# Patient Record
Sex: Male | Born: 1965 | Race: White | Hispanic: No | Marital: Married | Smoking: Never smoker
Health system: Southern US, Community
[De-identification: ages and names within clinical notes are randomized; demographics above are authoritative.]

## PROBLEM LIST (undated history)

## (undated) DIAGNOSIS — F1910 Other psychoactive substance abuse, uncomplicated: Secondary | ICD-10-CM

## (undated) DIAGNOSIS — I10 Essential (primary) hypertension: Secondary | ICD-10-CM

## (undated) DIAGNOSIS — K219 Gastro-esophageal reflux disease without esophagitis: Secondary | ICD-10-CM

## (undated) DIAGNOSIS — T7840XA Allergy, unspecified, initial encounter: Secondary | ICD-10-CM

## (undated) DIAGNOSIS — F419 Anxiety disorder, unspecified: Secondary | ICD-10-CM

## (undated) DIAGNOSIS — E1165 Type 2 diabetes mellitus with hyperglycemia: Secondary | ICD-10-CM

## (undated) DIAGNOSIS — F329 Major depressive disorder, single episode, unspecified: Secondary | ICD-10-CM

## (undated) DIAGNOSIS — E1159 Type 2 diabetes mellitus with other circulatory complications: Secondary | ICD-10-CM

## (undated) DIAGNOSIS — E119 Type 2 diabetes mellitus without complications: Secondary | ICD-10-CM

## (undated) DIAGNOSIS — E559 Vitamin D deficiency, unspecified: Secondary | ICD-10-CM

## (undated) DIAGNOSIS — E785 Hyperlipidemia, unspecified: Secondary | ICD-10-CM

## (undated) DIAGNOSIS — Z8719 Personal history of other diseases of the digestive system: Secondary | ICD-10-CM

## (undated) HISTORY — PX: ESOPHAGOGASTRODUODENOSCOPY (EGD) WITH ESOPHAGEAL DILATION: SHX5812

## (undated) HISTORY — DX: Essential (primary) hypertension: I10

## (undated) HISTORY — DX: Allergy, unspecified, initial encounter: T78.40XA

## (undated) HISTORY — DX: Vitamin D deficiency, unspecified: E55.9

## (undated) HISTORY — DX: Personal history of other diseases of the digestive system: Z87.19

## (undated) HISTORY — PX: TONSILLECTOMY: SUR1361

## (undated) HISTORY — DX: Hyperlipidemia, unspecified: E78.5

## (undated) HISTORY — DX: Type 2 diabetes mellitus with other circulatory complications: E11.59

## (undated) HISTORY — DX: Major depressive disorder, single episode, unspecified: F32.9

## (undated) HISTORY — DX: Other psychoactive substance abuse, uncomplicated: F19.10

## (undated) HISTORY — PX: ABDOMINAL SURGERY: SHX537

## (undated) HISTORY — PX: VASECTOMY: SHX75

## (undated) HISTORY — DX: Anxiety disorder, unspecified: F41.9

## (undated) HISTORY — PX: NASAL SEPTUM SURGERY: SHX37

## (undated) HISTORY — DX: Type 2 diabetes mellitus without complications: E11.9

## (undated) HISTORY — DX: Type 2 diabetes mellitus with hyperglycemia: E11.65

## (undated) HISTORY — DX: Gastro-esophageal reflux disease without esophagitis: K21.9

---

## 1999-09-26 ENCOUNTER — Other Ambulatory Visit: Admission: RE | Admit: 1999-09-26 | Discharge: 1999-09-26 | Payer: Self-pay | Admitting: Otolaryngology

## 2009-06-30 LAB — HM DIABETES EYE EXAM

## 2014-01-18 SURGERY — Surgical Case
Anesthesia: *Unknown

## 2014-08-02 HISTORY — PX: ESOPHAGOGASTRODUODENOSCOPY: SHX1529

## 2014-10-05 LAB — HM COLONOSCOPY

## 2016-08-13 ENCOUNTER — Encounter: Payer: Self-pay | Admitting: Family Medicine

## 2016-08-13 ENCOUNTER — Ambulatory Visit (INDEPENDENT_AMBULATORY_CARE_PROVIDER_SITE_OTHER): Payer: 59 | Admitting: Family Medicine

## 2016-08-13 VITALS — BP 120/50 | HR 72 | Resp 16 | Ht 69.0 in | Wt 183.2 lb

## 2016-08-13 DIAGNOSIS — F32A Depression, unspecified: Secondary | ICD-10-CM

## 2016-08-13 DIAGNOSIS — E119 Type 2 diabetes mellitus without complications: Secondary | ICD-10-CM | POA: Diagnosis not present

## 2016-08-13 DIAGNOSIS — Z7689 Persons encountering health services in other specified circumstances: Secondary | ICD-10-CM | POA: Diagnosis not present

## 2016-08-13 DIAGNOSIS — F329 Major depressive disorder, single episode, unspecified: Secondary | ICD-10-CM

## 2016-08-13 DIAGNOSIS — F419 Anxiety disorder, unspecified: Secondary | ICD-10-CM | POA: Diagnosis not present

## 2016-08-13 LAB — CBC WITH DIFFERENTIAL/PLATELET
BASOS ABS: 0 {cells}/uL (ref 0–200)
BASOS PCT: 0 %
EOS ABS: 178 {cells}/uL (ref 15–500)
Eosinophils Relative: 2 %
HEMATOCRIT: 42.2 % (ref 38.5–50.0)
Hemoglobin: 15.1 g/dL (ref 13.2–17.1)
LYMPHS PCT: 27 %
Lymphs Abs: 2403 cells/uL (ref 850–3900)
MCH: 31.8 pg (ref 27.0–33.0)
MCHC: 35.8 g/dL (ref 32.0–36.0)
MCV: 88.8 fL (ref 80.0–100.0)
MONOS PCT: 9 %
MPV: 8.8 fL (ref 7.5–12.5)
Monocytes Absolute: 801 cells/uL (ref 200–950)
NEUTROS PCT: 62 %
Neutro Abs: 5518 cells/uL (ref 1500–7800)
PLATELETS: 257 10*3/uL (ref 140–400)
RBC: 4.75 MIL/uL (ref 4.20–5.80)
RDW: 13.3 % (ref 11.0–15.0)
WBC: 8.9 10*3/uL (ref 4.0–10.5)

## 2016-08-13 LAB — POCT GLYCOSYLATED HEMOGLOBIN (HGB A1C): Hemoglobin A1C: 6.7

## 2016-08-13 LAB — COMPREHENSIVE METABOLIC PANEL
ALT: 21 U/L (ref 9–46)
AST: 16 U/L (ref 10–40)
Albumin: 4.4 g/dL (ref 3.6–5.1)
Alkaline Phosphatase: 63 U/L (ref 40–115)
BUN: 14 mg/dL (ref 7–25)
CALCIUM: 9.8 mg/dL (ref 8.6–10.3)
CHLORIDE: 105 mmol/L (ref 98–110)
CO2: 23 mmol/L (ref 20–31)
Creat: 0.9 mg/dL (ref 0.60–1.35)
GLUCOSE: 107 mg/dL — AB (ref 65–99)
POTASSIUM: 3.8 mmol/L (ref 3.5–5.3)
Sodium: 140 mmol/L (ref 135–146)
Total Bilirubin: 0.4 mg/dL (ref 0.2–1.2)
Total Protein: 6.5 g/dL (ref 6.1–8.1)

## 2016-08-13 LAB — TSH: TSH: 1.76 mIU/L (ref 0.40–4.50)

## 2016-08-13 MED ORDER — ESCITALOPRAM OXALATE 20 MG PO TABS: 20.0000 mg | ORAL_TABLET | Freq: Every day | ORAL | 2 refills | Status: DC

## 2016-08-13 MED ORDER — METFORMIN HCL 1000 MG PO TABS: 1000.0000 mg | ORAL_TABLET | Freq: Two times a day (BID) | ORAL | 2 refills | Status: DC

## 2016-08-13 NOTE — Patient Instructions (Addendum)
Stop Lantus. Your hemoglobin A1c is 6.7% and within goal range.  Continue taking Metformin twice daily.   Keep watching your sugar and carbohydrates.   Follow up in 4 months for Diabetes. I recommend that you return for an annual exam and fasting labs sometime in the next few weeks.

## 2016-08-13 NOTE — Progress Notes (Signed)
Subjective:    Patient ID: Jacob Hart, male    DOB: [birth year], 51 y.o.   MRN: [id number]  HPI Chief Complaint  Patient presents with  . Establish Care    new patient to establish care, diabetic. Has not had Lantus x 30 days but still taking oral meds. Last A1c about a year ago.    He is new to the practice and here to establish care. States he was diagnosed with type 2 diabetes 10 years ago. States he has been taking Metformin 1,000 mg twice daily. He has been out of Lantus for at least 1 month. Requests refills on medications. Has been on Lantus for the past 5 years.   States he takes Lexapro for anxiety and depression. States he is an alcoholic and stopped drinking alcohol 10 years ago.  Goes to [employer].   States he does not check blood sugars at home.  He stopped drinking soda.  He went to the nutritionist in the past and made healthy changes, cut carbohydrates.  Last eye exam: last year and no retinopathy.   Complains of left neck pain and and mild numbness to his left lateral arm running down to his thumb, 2nd and 3rd digits. This is intermittent for the past 5 months.  Sees a Restaurant manager, fast food. States it is not bothering him much and does not want testing or medication for this. He will let me know if this worsens. Denies weakness.   Denies fever, chills, dizziness, unexplained weight change, chest pain, palpitations, abdominal pain, N/V/D, urinary symptoms.   Colonoscopy 2 years ago.   Previous medical care: Dr. Shearon Stalls at [facility] internal medicine in [place], [place].  Other providers: none   Past medical history: recovering alcoholic and diabetes type 2.   Social history: Lives with divorced, works as a Furniture conservator/restorer.  Denies smoking, drinking alcohol, drug use Diet: he has made healthy diet changes.  Exercise: nothing regular.    Depression screen [facility] 2/9 08/13/2016 08/13/2016  Decreased Interest 0 0  Down, Depressed, Hopeless 0 0  PHQ - 2 Score 0 0     Reviewed  allergies, medications, past medical, surgical, family, and social history.    Review of Systems Pertinent positives and negatives in the history of present illness.     Objective:   Physical Exam BP (!) 120/50 (BP Location: Left Arm, Patient Position: Sitting, Cuff Size: Normal)   Pulse 72   Resp 16   Ht 5\' 9"  (1.753 m)   Wt 183 lb 3.2 oz (83.1 kg)   BMI 27.05 kg/m   Alert and in no distress. Tympanic membranes and canals are normal. Pharyngeal area is normal. Neck is supple without adenopathy or thyromegaly and normal ROM. Cardiac exam shows a regular sinus rhythm without murmurs or gallops. Lungs are clear to auscultation. Extremities without edema, normal sensation, pulses, ROM, strength.    Hemoglobin A1c  6.7%     Assessment & Plan:  Controlled type 2 diabetes mellitus without complication, unspecified whether long term insulin use (HCC) - Plan: HgB A1c, CBC with Differential/Platelet, Comprehensive metabolic panel, TSH, Microalbumin / creatinine urine ratio  Encounter to establish care  Anxiety and depression  Diabetes appears to be controlled on Metformin since he has been out of Lantus for at least a month. Will have him stop Lantus and continue on Metformin for now and we will see how he does.  Congratulated him on making healthy diet changes. Encouraged him to start exercising. He has never checked  blood sugars and will not have him start doing this.  He is doing well on Lexapro and has been stable on this for years. Will refill this for him.  Plan to have him return in 4 months for Diabetes and sooner if he desires a CPE and fasting labs.

## 2016-08-14 LAB — MICROALBUMIN / CREATININE URINE RATIO
Creatinine, Urine: 77 mg/dL (ref 20–370)
MICROALB UR: 0.6 mg/dL
MICROALB/CREAT RATIO: 8 ug/mg{creat} (ref <30)

## 2016-08-18 ENCOUNTER — Telehealth: Payer: Self-pay

## 2016-08-18 NOTE — Telephone Encounter (Signed)
Records from [facility] placed in your folder for review. Jacob Hart

## 2016-08-24 ENCOUNTER — Encounter: Payer: Self-pay | Admitting: Family Medicine

## 2016-08-26 ENCOUNTER — Encounter: Payer: Self-pay | Admitting: Family Medicine

## 2016-12-22 ENCOUNTER — Encounter: Payer: 59 | Admitting: Family Medicine

## 2016-12-23 ENCOUNTER — Encounter: Payer: Self-pay | Admitting: Family Medicine

## 2016-12-23 ENCOUNTER — Telehealth: Payer: Self-pay | Admitting: Family Medicine

## 2016-12-23 ENCOUNTER — Ambulatory Visit (INDEPENDENT_AMBULATORY_CARE_PROVIDER_SITE_OTHER): Payer: 59 | Admitting: Family Medicine

## 2016-12-23 VITALS — BP 120/72 | HR 80 | Ht 70.0 in | Wt 183.8 lb

## 2016-12-23 DIAGNOSIS — Z1211 Encounter for screening for malignant neoplasm of colon: Secondary | ICD-10-CM

## 2016-12-23 DIAGNOSIS — Z23 Encounter for immunization: Secondary | ICD-10-CM | POA: Diagnosis not present

## 2016-12-23 DIAGNOSIS — F32A Depression, unspecified: Secondary | ICD-10-CM | POA: Insufficient documentation

## 2016-12-23 DIAGNOSIS — Z0000 Encounter for general adult medical examination without abnormal findings: Secondary | ICD-10-CM | POA: Diagnosis not present

## 2016-12-23 DIAGNOSIS — Z7189 Other specified counseling: Secondary | ICD-10-CM

## 2016-12-23 DIAGNOSIS — K219 Gastro-esophageal reflux disease without esophagitis: Secondary | ICD-10-CM | POA: Insufficient documentation

## 2016-12-23 DIAGNOSIS — I10 Essential (primary) hypertension: Secondary | ICD-10-CM

## 2016-12-23 DIAGNOSIS — E119 Type 2 diabetes mellitus without complications: Secondary | ICD-10-CM | POA: Insufficient documentation

## 2016-12-23 DIAGNOSIS — E1165 Type 2 diabetes mellitus with hyperglycemia: Secondary | ICD-10-CM

## 2016-12-23 DIAGNOSIS — E113299 Type 2 diabetes mellitus with mild nonproliferative diabetic retinopathy without macular edema, unspecified eye: Secondary | ICD-10-CM | POA: Insufficient documentation

## 2016-12-23 DIAGNOSIS — E118 Type 2 diabetes mellitus with unspecified complications: Secondary | ICD-10-CM | POA: Insufficient documentation

## 2016-12-23 DIAGNOSIS — F419 Anxiety disorder, unspecified: Secondary | ICD-10-CM | POA: Diagnosis not present

## 2016-12-23 DIAGNOSIS — I152 Hypertension secondary to endocrine disorders: Secondary | ICD-10-CM

## 2016-12-23 DIAGNOSIS — E1159 Type 2 diabetes mellitus with other circulatory complications: Secondary | ICD-10-CM

## 2016-12-23 DIAGNOSIS — F329 Major depressive disorder, single episode, unspecified: Secondary | ICD-10-CM | POA: Diagnosis not present

## 2016-12-23 DIAGNOSIS — Z125 Encounter for screening for malignant neoplasm of prostate: Secondary | ICD-10-CM

## 2016-12-23 DIAGNOSIS — Z7185 Encounter for immunization safety counseling: Secondary | ICD-10-CM

## 2016-12-23 DIAGNOSIS — IMO0001 Reserved for inherently not codable concepts without codable children: Secondary | ICD-10-CM

## 2016-12-23 HISTORY — DX: Gastro-esophageal reflux disease without esophagitis: K21.9

## 2016-12-23 HISTORY — DX: Reserved for inherently not codable concepts without codable children: IMO0001

## 2016-12-23 HISTORY — DX: Depression, unspecified: F32.A

## 2016-12-23 HISTORY — DX: Type 2 diabetes mellitus with other circulatory complications: E11.59

## 2016-12-23 HISTORY — DX: Hypertension secondary to endocrine disorders: I15.2

## 2016-12-23 HISTORY — DX: Anxiety disorder, unspecified: F41.9

## 2016-12-23 HISTORY — DX: Type 2 diabetes mellitus without complications: E11.9

## 2016-12-23 LAB — HEMOCCULT GUIAC POC 1CARD (OFFICE)
Card #1 Date: 91918
Fecal Occult Blood, POC: NEGATIVE

## 2016-12-23 LAB — POCT URINALYSIS DIP (PROADVANTAGE DEVICE)
Bilirubin, UA: NEGATIVE
Blood, UA: NEGATIVE
GLUCOSE UA: NEGATIVE mg/dL
Ketones, POC UA: NEGATIVE mg/dL
Leukocytes, UA: NEGATIVE
Nitrite, UA: NEGATIVE
Protein Ur, POC: NEGATIVE mg/dL
Specific Gravity, Urine: 1.015
Urobilinogen, Ur: NEGATIVE
pH, UA: 7 (ref 5.0–8.0)

## 2016-12-23 LAB — LIPID PANEL
Cholesterol: 150 mg/dL (ref <200)
HDL: 32 mg/dL — AB (ref >40)
LDL Cholesterol (Calc): 95 mg/dL (calc)
NON-HDL CHOLESTEROL (CALC): 118 mg/dL (ref <130)
TRIGLYCERIDES: 125 mg/dL (ref <150)
Total CHOL/HDL Ratio: 4.7 (calc) (ref <5.0)

## 2016-12-23 LAB — PSA: PSA: 3.9 ng/mL (ref <=4.0)

## 2016-12-23 LAB — POCT GLYCOSYLATED HEMOGLOBIN (HGB A1C)

## 2016-12-23 MED ORDER — ENALAPRIL MALEATE 5 MG PO TABS: 5.0000 mg | ORAL_TABLET | Freq: Every day | ORAL | 1 refills | Status: DC

## 2016-12-23 MED ORDER — OMEPRAZOLE 20 MG PO CPDR: 20.0000 mg | DELAYED_RELEASE_CAPSULE | Freq: Every day | ORAL | 1 refills | Status: DC

## 2016-12-23 MED ORDER — DAPAGLIFLOZIN PROPANEDIOL 5 MG PO TABS: 5.0000 mg | ORAL_TABLET | Freq: Every day | ORAL | 0 refills | Status: DC

## 2016-12-23 MED ORDER — ONETOUCH DELICA LANCETS FINE MISC: 1 refills | Status: DC

## 2016-12-23 MED ORDER — GLUCOSE BLOOD VI STRP: ORAL_STRIP | 1 refills | Status: DC

## 2016-12-23 NOTE — Telephone Encounter (Signed)
Requested records from [facility] for colonoscopy

## 2016-12-23 NOTE — Patient Instructions (Addendum)
Call and schedule your diabetic eye exam.  Make sure you are getting at least 150 minutes of physical activity per week.  Cut back on sugar and carbohydrates (bread, potatoes, pasta, rice, etc)  Start taking the once daily [place].  Check your blood sugars every morning fasting.  Goal blood sugar is 80-130.  Call me in 2 weeks and let me now how your blood sugars are looking.    Follow up in 4 weeks and bring in your blood sugar readings.   Preventive Care for Adults, Male       REGULAR HEALTH EXAMS:  A routine yearly physical is a good way to check in with your primary care provider about your health and preventive screening. It is also an opportunity to share updates about your health and any concerns you have, and receive a thorough all-over exam.   Most health insurance companies pay for at least some preventative services.  Check with your health plan for specific coverages.  WHAT PREVENTATIVE SERVICES DO MEN NEED?  Adult men should have their weight and blood pressure checked regularly.   Men age 52 and older should have their cholesterol levels checked regularly.  Beginning at age 100 and continuing to age 57, men should be screened for colorectal cancer.  Certain people should may need continued testing until age 44.  Other cancer screening may include exams for testicular and prostate cancer.  Updating vaccinations is part of preventative care.  Vaccinations help protect against diseases such as the flu.  Lab tests are generally done as part of preventative care to screen for anemia and blood disorders, to screen for problems with the kidneys and liver, to screen for bladder problems, to check blood sugar, and to check your cholesterol level.  Preventative services generally include counseling about diet, exercise, avoiding tobacco, drugs, excessive alcohol consumption, and sexually transmitted infections.    GENERAL RECOMMENDATIONS FOR GOOD HEALTH:  Healthy diet:  Eat  a variety of foods, including fruit, vegetables, animal or vegetable protein, such as meat, fish, chicken, and eggs, or beans, lentils, tofu, and grains, such as rice.  Drink plenty of water daily.  Decrease saturated fat in the diet, avoid lots of red meat, processed foods, sweets, fast foods, and fried foods.  Exercise:  Aerobic exercise helps maintain good heart health. At least 30-40 minutes of moderate-intensity exercise is recommended. For example, a brisk walk that increases your heart rate and breathing. This should be done on most days of the week.   Find a type of exercise or a variety of exercises that you enjoy so that it becomes a part of your daily life.  Examples are running, walking, swimming, water aerobics, and biking.  For motivation and support, explore group exercise such as aerobic class, spin class, Zumba, Yoga,or  martial arts, etc.    Set exercise goals for yourself, such as a certain weight goal, walk or run in a race such as a 5k walk/run.  Speak to your primary care provider about exercise goals.  Disease prevention:  If you smoke or chew tobacco, find out from your caregiver how to quit. It can literally save your life, no matter how long you have been a tobacco user. If you do not use tobacco, never begin.   Maintain a healthy diet and normal weight. Increased weight leads to problems with blood pressure and diabetes.   The Body Mass Index or BMI is a way of measuring how much of your body is fat.  Having a BMI above 27 increases the risk of heart disease, diabetes, hypertension, stroke and other problems related to obesity. Your caregiver can help determine your BMI and based on it develop an exercise and dietary program to help you achieve or maintain this important measurement at a healthful level.  High blood pressure causes heart and blood vessel problems.  Persistent high blood pressure should be treated with medicine if weight loss and exercise do not work.    Fat and cholesterol leaves deposits in your arteries that can block them. This causes heart disease and vessel disease elsewhere in your body.  If your cholesterol is found to be high, or if you have heart disease or certain other medical conditions, then you may need to have your cholesterol monitored frequently and be treated with medication.   Ask if you should have a stress test if your history suggests this. A stress test is a test done on a treadmill that looks for heart disease. This test can find disease prior to there being a problem.  Avoid drinking alcohol in excess (more than two drinks per day).  Avoid use of street drugs. Do not share needles with anyone. Ask for professional help if you need assistance or instructions on stopping the use of alcohol, cigarettes, and/or drugs.  Brush your teeth twice a day with fluoride toothpaste, and floss once a day. Good oral hygiene prevents tooth decay and gum disease. The problems can be painful, unattractive, and can cause other health problems. Visit your dentist for a routine oral and dental check up and preventive care every 6-12 months.   Look at your skin regularly.  Use a mirror to look at your back. Notify your caregivers of changes in moles, especially if there are changes in shapes, colors, a size larger than a pencil eraser, an irregular border, or development of new moles.  Safety:  Use seatbelts 100% of the time, whether driving or as a passenger.  Use safety devices such as hearing protection if you work in environments with loud noise or significant background noise.  Use safety glasses when doing any work that could send debris in to the eyes.  Use a helmet if you ride a bike or motorcycle.  Use appropriate safety gear for contact sports.  Talk to your caregiver about gun safety.  Use sunscreen with a SPF (or skin protection factor) of 15 or greater.  Lighter skinned people are at a greater risk of skin cancer. Don't forget to  also wear sunglasses in order to protect your eyes from too much damaging sunlight. Damaging sunlight can accelerate cataract formation.   Practice safe sex. Use condoms. Condoms are used for birth control and to help reduce the spread of sexually transmitted infections (or STIs).  Some of the STIs are gonorrhea (the clap), chlamydia, syphilis, trichomonas, herpes, HPV (human papilloma virus) and HIV (human immunodeficiency virus) which causes AIDS. The herpes, HIV and HPV are viral illnesses that have no cure. These can result in disability, cancer and death.   Keep carbon monoxide and smoke detectors in your home functioning at all times. Change the batteries every 6 months or use a model that plugs into the wall.   Vaccinations:  Stay up to date with your tetanus shots and other required immunizations. You should have a booster for tetanus every 10 years. Be sure to get your flu shot every year, since 5%-20% of the [place] population comes down with the flu. The flu vaccine changes  each year, so being vaccinated once is not enough. Get your shot in the fall, before the flu season peaks.   Other vaccines to consider:  Pneumococcal vaccine to protect against certain types of pneumonia.  This is normally recommended for adults age 22 or older.  However, adults younger than 50 years old with certain underlying conditions such as diabetes, heart or lung disease should also receive the vaccine.  Shingles vaccine to protect against Varicella Zoster if you are older than age 62, or younger than 50 years old with certain underlying illness.  Hepatitis A vaccine to protect against a form of infection of the liver by a virus acquired from food.  Hepatitis B vaccine to protect against a form of infection of the liver by a virus acquired from blood or body fluids, particularly if you work in health care.  If you plan to travel internationally, check with your local health department for specific vaccination  recommendations.  Cancer Screening:  Most routine colon cancer screening begins at the age of 31. On a yearly basis, doctors may provide special easy to use take-home tests to check for hidden blood in the stool. Sigmoidoscopy or colonoscopy can detect the earliest forms of colon cancer and is life saving. These tests use a small camera at the end of a tube to directly examine the colon. Speak to your caregiver about this at age 47, when routine screening begins (and is repeated every 5 years unless early forms of pre-cancerous polyps or small growths are found).   At the age of 67 men usually start screening for prostate cancer every year. Screening may begin at a younger age for those with higher risk. Those at higher risk include African-Americans or having a family history of prostate cancer. There are two types of tests for prostate cancer:   Prostate-specific antigen (PSA) testing. Recent studies raise questions about prostate cancer using PSA and you should discuss this with your caregiver.   Digital rectal exam (in which your doctor's lubricated and gloved finger feels for enlargement of the prostate through the anus).   Screening for testicular cancer.  Do a monthly exam of your testicles. Gently roll each testicle between your thumb and fingers, feeling for any abnormal lumps. The best time to do this is after a hot shower or bath when the tissues are looser. Notify your caregivers of any lumps, tenderness or changes in size or shape immediately.

## 2016-12-23 NOTE — Progress Notes (Signed)
Subjective:    Patient ID: Jacob Hart, male    DOB: [birth year], 51 y.o.   MRN: [id number]  HPI Chief Complaint  Patient presents with  . cpe    fasitng cpe, DM. no other concerns. will get flu shot today   He is here for a complete physical exam and diabetes check.  Previous medical care: [place], [place].  Last CPE: years ago.   Other providers: chiropractor - [employer].   Reports ongoing increased thirst and urinary frequency. States he urinates 12-15 times per day and none at night. He does report drinking plenty of water during the day.   Past medical history: diabetes- for past 10 years. Lantus 17 units daily for 3 years and stopped it several months ago due to insurance issues. He is taking Metformin 1,000 mg twice daily. States he is taking it 5 days per week but skips some days. Does not want to check his blood sugar at home.   GERD- takes prilosec.  HTN- diagnosed 10 years ago. Does not check BP at home. No concerns.  Anxiety - no concerns. States he feels good taking Lexapro.   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 Recovering alcoholic and no alcohol in at least 9 years.  Diet: eats healthy, grows garden Exercise: some days  Immunizations: needs a flu shot.   Health maintenance:  Colonoscopy: 2 years ago. No polyps. This was done in [place], [place]. Also had an EGD with esophagus stretched.  Last PSA: never Last Dental Exam: he is scheduling a dental exam soon.  Last Eye Exam: years ago  Wears seatbelt always, uses sunscreen, smoke detectors in home and functioning, does not text while driving, feels safe in home environment.  Reviewed allergies, medications, past medical, surgical, family, and social history.    Review of Systems Review of Systems Constitutional: -fever, -chills, + night sweats intermittent, -unexpected weight change,-fatigue ENT: -runny nose, -ear pain, -sore throat Cardiology:  -chest  pain, -palpitations, -edema Respiratory: -cough, -shortness of breath, -wheezing Gastroenterology: -abdominal pain, -nausea, -vomiting, -diarrhea, -constipation  Hematology: -bleeding or bruising problems Musculoskeletal: -arthralgias, -myalgias, -joint swelling, -back pain Ophthalmology: -vision changes Urology: -dysuria, -difficulty urinating, -hematuria, +urinary frequency, -urgency Neurology: -headache, -weakness, -tingling, -numbness       Objective:   Physical Exam BP 120/72   Pulse 80   Ht 5\' 10"  (1.778 m)   Wt 183 lb 12.8 oz (83.4 kg)   BMI 26.37 kg/m   General Appearance:    Alert, cooperative, no distress, appears stated age  Head:    Normocephalic, without obvious abnormality, atraumatic  Eyes:    PERRL, conjunctiva/corneas clear, EOM's intact, fundi    benign  Ears:    Normal TM's and external ear canals  Nose:   Nares normal, mucosa normal, no drainage or sinus   tenderness  Throat:   Lips, mucosa, and tongue normal; teeth and gums normal  Neck:   Supple, no lymphadenopathy;  thyroid:  no   enlargement/tenderness/nodules; no carotid   bruit or JVD  Back:    Spine nontender, no curvature, ROM normal, no CVA     tenderness  Lungs:     Clear to auscultation bilaterally without wheezes, rales or     ronchi; respirations unlabored  Chest Wall:    No tenderness or deformity   Heart:    Regular rate and rhythm, S1 and S2 normal, no murmur, rub   or gallop  Breast Exam:    No chest wall tenderness, masses or gynecomastia  Abdomen:     Soft, non-tender, nondistended, normoactive bowel sounds,    no masses, no hepatosplenomegaly  Genitalia:    Normal circumcised male external genitalia without lesions.  Testicles without masses.  No inguinal hernias.  Rectal:    Normal sphincter tone, no masses or tenderness; guaiac negative stool.  Prostate smooth, no nodules, not enlarged.  Extremities:   No clubbing, cyanosis or edema  Pulses:   2+ and symmetric all extremities  Skin:    Skin color, texture, turgor normal, no rashes or lesions  Lymph nodes:   Cervical, supraclavicular, and axillary nodes normal  Neurologic:   CNII-XII intact, normal strength, sensation and gait; reflexes 2+ and symmetric throughout          Psych:   Normal mood, affect, hygiene and grooming.     Urinalysis dipstick: negative       Assessment & Plan:  Routine general medical examination at a health care facility - Plan: POCT Urinalysis DIP (Proadvantage Device), HgB A1c, Lipid panel, POCT occult blood stool  Uncontrolled diabetes mellitus type 2 without complications, unspecified whether long term insulin use (HCC) - Plan: Lipid panel  Gastroesophageal reflux disease, esophagitis presence not specified  Anxiety and depression  Hypertension associated with diabetes ([place])  Need for Tdap vaccination - Plan: Tdap vaccine greater than or equal to 7yo IM  Needs flu shot - Plan: Flu Vaccine QUAD 36+ mos IM  Screening for prostate cancer - Plan: PSA  Screen for colon cancer - Plan: POCT occult blood stool  Vaccine counseling  Hgb A1c was 6.7% in May and today is 8.0% Discussed uncontrolled diabetes and management. Recommend strict medication adherence. Will add low dose [place]. Counseled on potential side effects.  He does not want to take an injectable medication. He agrees to check his daily FBS for the next month to see how the medication is helping. He will call me in 2 weeks with his FBS readings and follow up in 4 weeks.  Counseled on healthy lifestyle.  Recommend he get a diabetic eye exam. Foot exam done and normal. microalbumin up to date.  GERD is well controlled.  HTN- within goal. Continue current medication.  Mood appears to be good on current medication.  Will check PSA.  Tdap given and counseling done on each component of the vaccine.  Flu shot given.  Follow up in 4 weeks for diabetes.

## 2016-12-23 NOTE — Telephone Encounter (Signed)
Rcvd office notes & procedure notes from [facility]

## 2016-12-24 ENCOUNTER — Encounter: Payer: Self-pay | Admitting: Family Medicine

## 2016-12-30 ENCOUNTER — Encounter: Payer: Self-pay | Admitting: Family Medicine

## 2017-01-03 ENCOUNTER — Telehealth: Payer: Self-pay | Admitting: Family Medicine

## 2017-01-03 NOTE — Telephone Encounter (Signed)
P.A. FARXIGA 

## 2017-01-07 ENCOUNTER — Telehealth: Payer: Self-pay | Admitting: Family Medicine

## 2017-01-07 ENCOUNTER — Other Ambulatory Visit: Payer: Self-pay | Admitting: Family Medicine

## 2017-01-07 MED ORDER — ESCITALOPRAM OXALATE 20 MG PO TABS: 20.0000 mg | ORAL_TABLET | Freq: Every day | ORAL | 0 refills | Status: DC

## 2017-01-07 NOTE — Telephone Encounter (Signed)
Recv'd refill request for Escitalopram 90 days from [facility]

## 2017-01-07 NOTE — Telephone Encounter (Signed)
Sent med in 30 days to local pharmacy sent a refill request came in from local pharmacy and 90 days in at mail order pharmacy

## 2017-01-07 NOTE — Telephone Encounter (Signed)
err

## 2017-01-07 NOTE — Telephone Encounter (Signed)
Ok to give 90 day refill with [facility]

## 2017-01-07 NOTE — Telephone Encounter (Signed)
Sent med in 30 days to local pharmacy and 90 days in at mail order pharmacy

## 2017-01-17 NOTE — Telephone Encounter (Signed)
Wilder Glade denied, pt needs trial of both Invokana and Jardiance.

## 2017-01-22 ENCOUNTER — Ambulatory Visit: Payer: 59 | Admitting: Family Medicine

## 2017-01-29 ENCOUNTER — Ambulatory Visit: Payer: 59 | Admitting: Family Medicine

## 2017-02-01 ENCOUNTER — Other Ambulatory Visit: Payer: Self-pay | Admitting: Family Medicine

## 2017-02-01 ENCOUNTER — Ambulatory Visit (INDEPENDENT_AMBULATORY_CARE_PROVIDER_SITE_OTHER): Payer: 59 | Admitting: Family Medicine

## 2017-02-01 ENCOUNTER — Encounter: Payer: Self-pay | Admitting: Family Medicine

## 2017-02-01 VITALS — BP 120/68 | HR 75 | Wt 180.8 lb

## 2017-02-01 DIAGNOSIS — E1159 Type 2 diabetes mellitus with other circulatory complications: Secondary | ICD-10-CM | POA: Diagnosis not present

## 2017-02-01 DIAGNOSIS — E1165 Type 2 diabetes mellitus with hyperglycemia: Secondary | ICD-10-CM | POA: Diagnosis not present

## 2017-02-01 DIAGNOSIS — I10 Essential (primary) hypertension: Secondary | ICD-10-CM | POA: Diagnosis not present

## 2017-02-01 DIAGNOSIS — K219 Gastro-esophageal reflux disease without esophagitis: Secondary | ICD-10-CM | POA: Diagnosis not present

## 2017-02-01 DIAGNOSIS — I152 Hypertension secondary to endocrine disorders: Secondary | ICD-10-CM

## 2017-02-01 DIAGNOSIS — IMO0001 Reserved for inherently not codable concepts without codable children: Secondary | ICD-10-CM

## 2017-02-01 MED ORDER — OMEPRAZOLE 20 MG PO CPDR: 20.0000 mg | DELAYED_RELEASE_CAPSULE | Freq: Every day | ORAL | 1 refills | Status: DC

## 2017-02-01 MED ORDER — ATORVASTATIN CALCIUM 10 MG PO TABS: 10.0000 mg | ORAL_TABLET | Freq: Every day | ORAL | 0 refills | Status: DC

## 2017-02-01 MED ORDER — METFORMIN HCL 1000 MG PO TABS: ORAL_TABLET | ORAL | 1 refills | Status: DC

## 2017-02-01 NOTE — Patient Instructions (Signed)
Take the Metformin and Farxiga as prescribed. If you are seeing daily fasting blood sugars >180 let me know.   Start the atorvastatin once daily for your cholesterol.   Follow up with me in 8 weeks.

## 2017-02-01 NOTE — Progress Notes (Signed)
   Subjective:    Patient ID: Jacob Hart, male    DOB: 1966-01-12, 51 y.o.   MRN: [id number]  HPI Chief Complaint  Patient presents with  . 4 week follow-up    blood sugars ranging from 118-200. all sugars has been fasting. farixga is 500 dollars with insurance, ran out of meds over the weekend   He is here for a 1 month follow up on diabetes. His hemoglobin A1c increased from 6.7% in May to 8.0% in September. We decided to start him on a second medication Farxiga at that time. He received a one month free prescription and states when he went to get his refill the medication was not affordable. States he did not give them the co-pay coupon.   States he ran out of Metformin last week. He did not let us know.   He has been checking blood sugars most days and his readings have been between 118-200. No low blood sugars.   States when he is stressed he eats a lot of sugar. States his he just got married and his wife fell and broke 2 ribs. States he has increased stress with this and with his job so he has been eating candy bars and drinking soda. States this is better than drinking liquor which he has done in the past when he was stressed. He is a recovering alcoholic.   Denies fever, chills, chest pain, shortness of breath, abdominal pain, N/V/D. No increased thirst.   Requests refill on omeprazole. Reports reflux symptoms without the medication.   Reviewed allergies, medications, past medical, surgical, and social history.   Review of Systems Pertinent positives and negatives in the history of present illness.     Objective:   Physical Exam BP 120/68   Pulse 75   Wt 180 lb 12.8 oz (82 kg)   BMI 25.94 kg/m  Alert and oriented and in no acute distress. Not otherwise examined.      Assessment & Plan:  Uncontrolled diabetes mellitus type 2 without complications ([place])  Hypertension associated with diabetes ([place])  Gastroesophageal reflux disease, esophagitis presence not  specified  Discussed that his blood sugars are not in goal range. Refilled Metformin and gave him a coupon card to take to his pharmacy for [place]. He will let me know if this is still not affordable. Recommend he continue checking blood sugars once daily for now.  Advised to cut back on sugar and increase exercise.  Offered to refer him to counseling to help him deal with stress and he declines.  Will start him on a statin due to diabetes. Counseled on possible side effects.  Refilled omeprazole for reflux. Also counseled on lifestyle management of GERD.  Asked him to call me in 2 weeks to let me know how he is doing on medication and what his blood sugar readings have been. He will follow up fasting in 8 weeks for diabetes, HTN, hyperlipidemia. Check lipids at that time.

## 2017-02-17 ENCOUNTER — Telehealth: Payer: Self-pay | Admitting: Family Medicine

## 2017-02-17 MED ORDER — DAPAGLIFLOZIN PROPANEDIOL 5 MG PO TABS: 5.0000 mg | ORAL_TABLET | Freq: Every day | ORAL | 2 refills | Status: DC

## 2017-02-17 NOTE — Telephone Encounter (Signed)
Called pharmacy with refills per Vickie & gave pharmacist co pay card info and she will call back once she processes if there is any problems with it going thru for $0 co pay.  Left message for pt

## 2017-02-17 NOTE — Telephone Encounter (Signed)
Pt called and wanted to let you know that his blood sugars have been running 120-140 in the am he just wanted to let you know pt can be reached at [phone]

## 2017-04-01 NOTE — Progress Notes (Signed)
  Subjective:    Patient ID: Jacob Hart, male    DOB: 1965/06/19, 51 y.o.   MRN: [id number]  Jacob Hart Hart is a 51 y.o. male who presents for follow-up of Type 2 diabetes mellitus.  Patient is checking home blood sugars.   Home blood sugar records: 240-130. Improving gradually  How often is blood sugars being checked: weekly Current symptoms include: headache when above 180 blood sugar. Patient denies hypoglycemia, fever, chills, chest pain, abdominal pain, N/V/D, or increased thirst.  Patient does not checking their feet daily. Any Foot concerns (callous, ulcer, wound, thickened nails, toenail fungus, skin fungus, hammer toe): No concerns. Last dilated eye exam: 2 years ago, still has not been.  Current treatments: Metformin and added Farxiga 2 months ago this  which has been effective and improved blood sugars. We also started him on atorvastatin 2 months ago and will need to recheck lipids. He reports having coffee with cream this morning.  Medication compliance: good  Current diet: in general, a "healthy" diet   states he has cut back on sugar.  Current exercise: none Known diabetic complications: none. Needs diabetic eye exam.   The following portions of the patient's history were reviewed and updated as appropriate: allergies, current medications, past medical history, past social history and problem list.  ROS as in subjective above.     Objective:    Physical Exam Alert and in no distress otherwise not examined.  Blood pressure 118/62, pulse 78, height 5\' 9"  (1.753 m), weight 182 lb 6.4 oz (82.7 kg), SpO2 98 %.  Lab Review Diabetic Labs Latest Ref Rng & Units 04/02/2017 12/23/2016 08/13/2016  HbA1c - 6.9 8.0% 6.7  Microalbumin Not estab mg/dL - - 0.6  Micro/Creat Ratio <30 mcg/mg creat - - 8  Chol <200 mg/dL - 150 -  HDL >40 mg/dL - 32(L) -  Triglycerides <150 mg/dL - 125 -  Creatinine 0.60 - 1.35 mg/dL - - 0.90   BP/Weight 04/02/2017 02/01/2017  12/23/2016 4/33/2951  Systolic BP 884 166 063 016  Diastolic BP 62 68 72 50  Wt. (Lbs) 182.4 180.8 183.8 183.2  BMI 26.94 25.94 26.37 27.05   Foot/eye exam completion dates 12/23/2016  Foot Form Completion Done    Sayer  reports that  has never smoked. His smokeless tobacco use includes snuff. He reports that he does not drink alcohol or use drugs.     Assessment & Plan:    Controlled type 2 diabetes mellitus without complication, without long-term current use of insulin ([place]) - Plan: CBC with Differential/Platelet, Comprehensive metabolic panel, POCT glycosylated hemoglobin (Hb A1C), Lipid panel  Hypertension associated with diabetes ([place]) - Plan: CBC with Differential/Platelet, Comprehensive metabolic panel  Medication management - Plan: Lipid panel  1. Rx changes: none hemoglobin A1c 6.9% and which improved from 8.0% continue on Metformin and Farxiga.  2. Education: Reviewed 'ABCs' of diabetes management (respective goals in parentheses):  A1C (<7), blood pressure (<130/80), and cholesterol (LDL <100). 3. Compliance at present is estimated to be good. Efforts to improve compliance (if necessary) will be directed at dietary modifications: continue limiting sugar intake, increased exercise and regular blood sugar monitoring: daily. 4. Check labs including lipids since starting on atorvastatin 2 months ago.  5. He is aware that he is overdue for diabetic eye exam. Encouraged him to get this.  6. Follow up: 4 months

## 2017-04-02 ENCOUNTER — Ambulatory Visit: Payer: No Typology Code available for payment source | Admitting: Family Medicine

## 2017-04-02 ENCOUNTER — Other Ambulatory Visit: Payer: Self-pay

## 2017-04-02 ENCOUNTER — Encounter: Payer: Self-pay | Admitting: Family Medicine

## 2017-04-02 VITALS — BP 118/62 | HR 78 | Ht 69.0 in | Wt 182.4 lb

## 2017-04-02 DIAGNOSIS — Z79899 Other long term (current) drug therapy: Secondary | ICD-10-CM

## 2017-04-02 DIAGNOSIS — I152 Hypertension secondary to endocrine disorders: Secondary | ICD-10-CM

## 2017-04-02 DIAGNOSIS — E1159 Type 2 diabetes mellitus with other circulatory complications: Secondary | ICD-10-CM

## 2017-04-02 DIAGNOSIS — I10 Essential (primary) hypertension: Secondary | ICD-10-CM

## 2017-04-02 DIAGNOSIS — E119 Type 2 diabetes mellitus without complications: Secondary | ICD-10-CM | POA: Diagnosis not present

## 2017-04-02 LAB — CBC WITH DIFFERENTIAL/PLATELET
BASOS ABS: 38 {cells}/uL (ref 0–200)
Basophils Relative: 0.6 %
EOS PCT: 3.4 %
Eosinophils Absolute: 218 cells/uL (ref 15–500)
HEMATOCRIT: 47 % (ref 38.5–50.0)
HEMOGLOBIN: 16.4 g/dL (ref 13.2–17.1)
LYMPHS ABS: 1734 {cells}/uL (ref 850–3900)
MCH: 31.2 pg (ref 27.0–33.0)
MCHC: 34.9 g/dL (ref 32.0–36.0)
MCV: 89.4 fL (ref 80.0–100.0)
MPV: 8.9 fL (ref 7.5–12.5)
Monocytes Relative: 9.5 %
NEUTROS ABS: 3802 {cells}/uL (ref 1500–7800)
Neutrophils Relative %: 59.4 %
Platelets: 232 10*3/uL (ref 140–400)
RBC: 5.26 10*6/uL (ref 4.20–5.80)
RDW: 12.6 % (ref 11.0–15.0)
Total Lymphocyte: 27.1 %
WBC: 6.4 10*3/uL (ref 3.8–10.8)
WBCMIX: 608 {cells}/uL (ref 200–950)

## 2017-04-02 LAB — LIPID PANEL
Cholesterol: 116 mg/dL (ref <200)
HDL: 31 mg/dL — ABNORMAL LOW (ref >40)
LDL Cholesterol (Calc): 71 mg/dL (calc)
NON-HDL CHOLESTEROL (CALC): 85 mg/dL (ref <130)
Total CHOL/HDL Ratio: 3.7 (calc) (ref <5.0)
Triglycerides: 65 mg/dL (ref <150)

## 2017-04-02 LAB — COMPREHENSIVE METABOLIC PANEL
AG RATIO: 2.5 (calc) (ref 1.0–2.5)
ALBUMIN MSPROF: 4.5 g/dL (ref 3.6–5.1)
ALKALINE PHOSPHATASE (APISO): 67 U/L (ref 40–115)
ALT: 35 U/L (ref 9–46)
AST: 19 U/L (ref 10–35)
BILIRUBIN TOTAL: 0.4 mg/dL (ref 0.2–1.2)
BUN: 19 mg/dL (ref 7–25)
CALCIUM: 9.3 mg/dL (ref 8.6–10.3)
CHLORIDE: 103 mmol/L (ref 98–110)
CO2: 25 mmol/L (ref 20–32)
Creat: 0.7 mg/dL (ref 0.70–1.33)
Globulin: 1.8 g/dL (calc) — ABNORMAL LOW (ref 1.9–3.7)
Glucose, Bld: 175 mg/dL — ABNORMAL HIGH (ref 65–99)
POTASSIUM: 4.3 mmol/L (ref 3.5–5.3)
SODIUM: 138 mmol/L (ref 135–146)
TOTAL PROTEIN: 6.3 g/dL (ref 6.1–8.1)

## 2017-04-02 LAB — POCT GLYCOSYLATED HEMOGLOBIN (HGB A1C): Hemoglobin A1C: 6.9

## 2017-04-02 MED ORDER — GLUCOSE BLOOD VI STRP: ORAL_STRIP | 1 refills | Status: DC

## 2017-04-02 NOTE — Patient Instructions (Addendum)
Your hemoglobin A1c is 6.9% and back in goal range. Continue checking your blood sugar. Goal range 80-130 fasting and 130-180 2 hours after a meal. If you are seeing your readings out of range, let me know.  Continue on your current medication regimen.   We will call you with your lab results.   Please get your diabetic eye exam.   [facility] [place], [place]

## 2017-04-04 ENCOUNTER — Other Ambulatory Visit: Payer: Self-pay | Admitting: Family Medicine

## 2017-04-05 ENCOUNTER — Telehealth: Payer: Self-pay

## 2017-04-05 NOTE — Telephone Encounter (Signed)
LVM, gave lab results. Gave call back number if any questions or concerns.  

## 2017-04-05 NOTE — Telephone Encounter (Signed)
-----   Message from Girtha Rm, NP-C sent at 04/03/2017  7:48 PM EST ----- His blood work is fine.

## 2017-04-15 ENCOUNTER — Other Ambulatory Visit: Payer: Self-pay | Admitting: Family Medicine

## 2017-04-15 ENCOUNTER — Telehealth: Payer: Self-pay | Admitting: Family Medicine

## 2017-04-15 MED ORDER — EMPAGLIFLOZIN 10 MG PO TABS: 10.0000 mg | ORAL_TABLET | Freq: Every day | ORAL | 2 refills | Status: DC

## 2017-04-15 NOTE — Progress Notes (Unsigned)
   Subjective:    Patient ID: Jacob Hart, male    DOB: 1965/11/11, 52 y.o.   MRN: [id number]  HPI    Review of Systems     Objective:   Physical Exam        Assessment & Plan:

## 2017-04-15 NOTE — Telephone Encounter (Signed)
Called and left message on pts voicemail that vickie called in [place].

## 2017-04-16 NOTE — Telephone Encounter (Signed)
Pt called and said he went to pharmacy to pick up [place] & he had been switched to [place] and doesn't know why and wasn't called and told anything about it, states the [employer] was working well and was getting with co pay card for $0.

## 2017-04-16 NOTE — Telephone Encounter (Signed)
Please keep him on Farxiga. I did this because I received a fax stating it was not covered.

## 2017-04-17 ENCOUNTER — Telehealth: Payer: Self-pay | Admitting: Family Medicine

## 2017-04-17 NOTE — Telephone Encounter (Signed)
Went ahead and completed P.A. JARDIANCE

## 2017-04-17 NOTE — Telephone Encounter (Signed)
Called pharmacy s/w Kenney Houseman and [employer] discount card this year has changed and is only taking $150 off for cash pay so comes out to $116 a month, and using card with new insurance requires P.A. Also states trial Jardiance and [employer] (same as last year) but no longer working out to [employer] as cash pay option.  Jacob Hart is also requiring P.A.

## 2017-04-22 NOTE — Telephone Encounter (Signed)
P.A. Vania Rea approved, called pharmacy & went thru for $15. Called pt and left message need to know if he wants to stay with [place] for $116 a month or Jardiance for $15 a month

## 2017-04-23 ENCOUNTER — Telehealth: Payer: Self-pay | Admitting: Family Medicine

## 2017-04-23 NOTE — Telephone Encounter (Signed)
Pt states he would like to go with the [place], he can't afford the [place] for $116 and would like to try the Jardiance for $15.  Called pharmacy and advised the above

## 2017-04-23 NOTE — Telephone Encounter (Signed)
Called [facility] and was informed that office note needed to be sent over and then the doctor will have totake a look to see if he could help pt. After that is done their office will call to let us know if pt can be seen and when . Faxing over chart note now and will call pt to inform them of their process. Thanks [employer]

## 2017-04-23 NOTE — Telephone Encounter (Signed)
   Vickie told pt to make diabetic eye appointment at  [facility]  Fax  [phone]  He called them and they are asking for a referral

## 2017-06-06 ENCOUNTER — Other Ambulatory Visit: Payer: Self-pay | Admitting: Family Medicine

## 2017-06-07 NOTE — Telephone Encounter (Signed)
Is this okay to refill? 

## 2017-06-07 NOTE — Telephone Encounter (Signed)
Okay to refill for 3 months and then we need an office visit.

## 2017-06-23 ENCOUNTER — Telehealth: Payer: Self-pay | Admitting: Family Medicine

## 2017-06-23 MED ORDER — ATORVASTATIN CALCIUM 10 MG PO TABS: 10.0000 mg | ORAL_TABLET | Freq: Every day | ORAL | 0 refills | Status: DC

## 2017-06-23 NOTE — Telephone Encounter (Signed)
done

## 2017-06-23 NOTE — Telephone Encounter (Signed)
New Message   *STAT* If patient is at the pharmacy, call can be transferred to refill team.   1. Which medications need to be refilled? (please list name of each medication and dose if known)  Atorvastatin 10 mg tablet once daily  2. Which pharmacy/location (including street and city if local pharmacy) is medication to be sent to? [facility] [place], [place]  3. Do they need a 30 day or 90 day supply?  90 days

## 2017-07-21 ENCOUNTER — Other Ambulatory Visit: Payer: Self-pay | Admitting: Family Medicine

## 2017-07-21 NOTE — Telephone Encounter (Signed)
Pt has an appt on may 3rd

## 2017-08-04 ENCOUNTER — Ambulatory Visit: Payer: No Typology Code available for payment source | Admitting: Family Medicine

## 2017-08-04 ENCOUNTER — Encounter: Payer: Self-pay | Admitting: Family Medicine

## 2017-08-04 VITALS — BP 120/70 | HR 62 | Ht 69.75 in | Wt 179.8 lb

## 2017-08-04 DIAGNOSIS — E119 Type 2 diabetes mellitus without complications: Secondary | ICD-10-CM

## 2017-08-04 DIAGNOSIS — K219 Gastro-esophageal reflux disease without esophagitis: Secondary | ICD-10-CM

## 2017-08-04 DIAGNOSIS — F419 Anxiety disorder, unspecified: Secondary | ICD-10-CM | POA: Diagnosis not present

## 2017-08-04 DIAGNOSIS — E1159 Type 2 diabetes mellitus with other circulatory complications: Secondary | ICD-10-CM | POA: Diagnosis not present

## 2017-08-04 DIAGNOSIS — I10 Essential (primary) hypertension: Secondary | ICD-10-CM

## 2017-08-04 DIAGNOSIS — I152 Hypertension secondary to endocrine disorders: Secondary | ICD-10-CM

## 2017-08-04 DIAGNOSIS — F329 Major depressive disorder, single episode, unspecified: Secondary | ICD-10-CM

## 2017-08-04 DIAGNOSIS — F32A Depression, unspecified: Secondary | ICD-10-CM

## 2017-08-04 LAB — POCT GLYCOSYLATED HEMOGLOBIN (HGB A1C): Hemoglobin A1C: 7

## 2017-08-04 NOTE — Progress Notes (Signed)
Subjective:    Patient ID: Jacob Hart, male    DOB: 1966/04/06, 52 y.o.   MRN: [id number]  Jacob Hart is a 52 y.o. male who presents for follow-up of Type 2 diabetes mellitus and other chronic health conditions.  He has been eating pizza and high carbs at time and his blood sugars have reflected this. States he does not do this very often. Denies any low blood sugars.   Reports he has been feeling good. Reports good compliance on all medications.  No side effects  no new concerns today. He is currently taking omeprazole daily for reflux. States he is doing well on Lexapro.  His mood has improved and is stable.  States he likes this medication.  Patient is checking home blood sugars.   Home blood sugar records: ranging between 150 to 250 How often is blood sugars being checked: once a day Current symptoms include: none. Patient denies increased appetite, nausea, visual disturbances, vomiting and weight loss.  Patient is checking their feet daily. Any Foot concerns (callous, ulcer, wound, thickened nails, toenail fungus, skin fungus, hammer toe): none Last dilated eye exam: never. States he attempted to get this done.   Current treatments: doing well on dm meds. Medication compliance: good  Current diet: in general, a "healthy" diet   Current exercise: none Known diabetic complications: none  The following portions of the patient's history were reviewed and updated as appropriate: allergies, current medications, past medical history, past social history and problem list.  ROS as in subjective above.     Objective:    Physical Exam Alert and in no distress otherwise not examined.  Blood pressure 120/70, pulse 62, height 5' 9.75" (1.772 m), weight 179 lb 12.8 oz (81.6 kg), SpO2 98 %.  Lab Review Diabetic Labs Latest Ref Rng & Units 08/04/2017 04/02/2017 12/23/2016 08/13/2016  HbA1c - 7.0% 6.9 8.0% 6.7  Microalbumin Not estab mg/dL - - - 0.6  Micro/Creat Ratio  <30 mcg/mg creat - - - 8  Chol <200 mg/dL - 116 150 -  HDL >40 mg/dL - 31(L) 32(L) -  Calc LDL mg/dL (calc) - 71 95 -  Triglycerides <150 mg/dL - 65 125 -  Creatinine 0.70 - 1.33 mg/dL - 0.70 - 0.90   BP/Weight 08/04/2017 04/02/2017 02/01/2017 12/23/2016 3/76/2831  Systolic BP 517 616 073 710 626  Diastolic BP 70 62 68 72 50  Wt. (Lbs) 179.8 182.4 180.8 183.8 183.2  BMI 25.98 26.94 25.94 26.37 27.05   Foot/eye exam completion dates 12/23/2016  Foot Form Completion Done    Jacob Hart  reports that he has never smoked. His smokeless tobacco use includes snuff. He reports that he does not drink alcohol or use drugs.     Assessment & Plan:    Controlled type 2 diabetes mellitus without complication, without long-term current use of insulin ([place]) - Plan: HgB A1c  Hypertension associated with diabetes ([place])  Gastroesophageal reflux disease, esophagitis presence not specified  Anxiety and depression  1. Rx changes: none Hgb A1c 7.0%.  2. Education: Reviewed 'ABCs' of diabetes management (respective goals in parentheses):  A1C (<7), blood pressure (<130/80), and cholesterol (LDL <100). 3. Doing well on statin and BP medications and no side effects or concerns.  4. Discussed using PPI sparingly and not daily due to potential long term side effects. Discussed lifestyle management for GERD.  5. His mood is good on Lexapro. Continue on medication.  6. Compliance at present is estimated to be  good. Efforts to improve compliance (if necessary) will be directed at dietary modifications: limit foods high in carbohydrates and sweets, increased exercise and regular blood sugar monitoring: daily. 7. Follow up: 4 months for fasting CPE. He will need a form filled out and will bring this in.

## 2017-08-04 NOTE — Patient Instructions (Addendum)
Your hemoglobin A1c is 7.0% and at goal.  Continue on your current medications.   You should receive a phone call to schedule your diabetic eye exam.   I will see you back for your fasting physical in September. If you see your blood sugars out of goal range on a regular basis then let me know.  Goal fasting blood sugar is 80-130 and 2 hours after a meal is 130-160

## 2017-08-06 ENCOUNTER — Ambulatory Visit: Payer: No Typology Code available for payment source | Admitting: Family Medicine

## 2017-08-17 ENCOUNTER — Encounter (INDEPENDENT_AMBULATORY_CARE_PROVIDER_SITE_OTHER): Payer: No Typology Code available for payment source | Admitting: Ophthalmology

## 2017-08-21 ENCOUNTER — Other Ambulatory Visit: Payer: Self-pay | Admitting: Family Medicine

## 2017-08-22 ENCOUNTER — Other Ambulatory Visit: Payer: Self-pay | Admitting: Family Medicine

## 2017-08-27 ENCOUNTER — Other Ambulatory Visit: Payer: Self-pay | Admitting: Family Medicine

## 2017-08-29 ENCOUNTER — Other Ambulatory Visit: Payer: Self-pay | Admitting: Family Medicine

## 2017-08-31 NOTE — Telephone Encounter (Signed)
Pt has an appt in september 

## 2017-09-20 ENCOUNTER — Other Ambulatory Visit: Payer: Self-pay | Admitting: Family Medicine

## 2017-09-29 ENCOUNTER — Other Ambulatory Visit: Payer: Self-pay | Admitting: Family Medicine

## 2017-10-18 ENCOUNTER — Encounter (INDEPENDENT_AMBULATORY_CARE_PROVIDER_SITE_OTHER): Payer: No Typology Code available for payment source | Admitting: Ophthalmology

## 2017-11-01 ENCOUNTER — Encounter (INDEPENDENT_AMBULATORY_CARE_PROVIDER_SITE_OTHER): Payer: No Typology Code available for payment source | Admitting: Ophthalmology

## 2017-11-02 ENCOUNTER — Encounter (INDEPENDENT_AMBULATORY_CARE_PROVIDER_SITE_OTHER): Payer: No Typology Code available for payment source | Admitting: Ophthalmology

## 2017-11-21 ENCOUNTER — Other Ambulatory Visit: Payer: Self-pay | Admitting: Family Medicine

## 2017-12-02 ENCOUNTER — Telehealth: Payer: Self-pay | Admitting: Internal Medicine

## 2017-12-02 NOTE — Progress Notes (Signed)
[facility] Note  12/03/2017     CHIEF COMPLAINT Patient presents for Retina Evaluation and Diabetic Eye Exam   HISTORY OF PRESENT ILLNESS: Jacob Hart is a 52 y.o. male who presents to the clinic today for:   HPI    Retina Evaluation    In both eyes.  This started 1 year ago.  Associated Symptoms Pain.  Negative for Flashes, Blind Spot, Photophobia, Scalp Tenderness, Fever, Floaters, Glare, Jaw Claudication, Weight Loss, Distortion, Redness, Trauma, Shoulder/Hip pain and Fatigue.  Context:  distance vision, mid-range vision and near vision.  Treatments tried include artificial tears.  Response to treatment was no improvement.  I, the attending physician,  performed the HPI with the patient and updated documentation appropriately.          Diabetic Eye Exam    Vision is stable.  Associated Symptoms Negative for Flashes, Blind Spot, Photophobia, Scalp Tenderness, Fever, Weight Loss, Jaw Claudication, Glare, Pain, Floaters, Distortion, Redness, Trauma, Shoulder/Hip pain and Fatigue.  Diabetes characteristics include Type 2, on insulin and taking oral medications.  This started 10 years ago.  Blood sugar level is controlled.  Last Blood Glucose 150.  Last A1C 7.  I, the attending physician,  performed the HPI with the patient and updated documentation appropriately.          Comments    Referral of [place] family for retina eval/DME. Patient states for the past year his vision has been blurry off/ on and occasional "eye ball pain", it feels like I have rocks in my eyes". patient is DM2 x 10 yrs, Pt reports he does not monitor BS daily but are WINL , " I usually check my BS when I have a head ache". BS are not fasting . BS 150 x 1 wk ago after meal. A1c 7.0. Pt is on Jardiance and metformin. Pt is using Visine gtt's PRN       Last edited by Bernarda Caffey, MD on 12/03/2017  9:35 AM. (History)    Pt states he was sent by PCP; Pt states he is diabetic,  reports has been diabetic x 10 years; Pt states DM is well controlled with diet;   Referring physician: Girtha Rm, NP-C [place], [place] [address]  HISTORICAL INFORMATION:   Selected notes from the MEDICAL RECORD NUMBER Referred by [name], NP-C for DM exam LEE:  Ocular Hx- PMH-DM (last A1C: 7.0, taking jardiance, metformin) HTN, anxiety, depression    CURRENT MEDICATIONS: No current outpatient medications on file. (Ophthalmic Drugs)   No current facility-administered medications for this visit.  (Ophthalmic Drugs)   Current Outpatient Medications (Other)  Medication Sig  . atorvastatin (LIPITOR) 10 MG tablet TAKE 1 TABLET BY MOUTH EVERY DAY  . enalapril (VASOTEC) 5 MG tablet TAKE ONE TABLET BY MOUTH ONCE DAILY  . escitalopram (LEXAPRO) 20 MG tablet TAKE 1 TABLET BY MOUTH EVERY DAY  . glucose blood test strip Test once a day. Pt uses one touch verio flex meter  . JARDIANCE 10 MG TABS tablet TAKE 1 TABLET BY MOUTH ONCE DAILY  . metFORMIN (GLUCOPHAGE) 1000 MG tablet TAKE 1 TABLET BY MOUTH TWICE A DAY WITH A MEAL  . omeprazole (PRILOSEC) 20 MG capsule TAKE ONE CAPSULE BY MOUTH ONCE DAILY  . ONETOUCH DELICA LANCETS FINE MISC Test once a day   No current facility-administered medications for this visit.  (Other)      REVIEW OF SYSTEMS: ROS  Positive for: Endocrine, Eyes   Negative for: Constitutional, Gastrointestinal, Neurological, Skin, Genitourinary, Musculoskeletal, HENT, Cardiovascular, Respiratory, Psychiatric, Allergic/Imm, Heme/Lymph   Last edited by Zenovia Jordan, LPN on 6/57/8469  6:29 AM. (History)       ALLERGIES Allergies  Allergen Reactions  . Sulfa Antibiotics Hives    PAST MEDICAL HISTORY Past Medical History:  Diagnosis Date  . Anxiety and depression 12/23/2016  . Controlled type 2 diabetes mellitus without complication, without long-term current use of insulin ([place]) 12/23/2016  . GERD (gastroesophageal reflux disease)  12/23/2016  . History of esophageal stricture    dilation done in 2016 at [facility]  . Hypertension associated with diabetes ([place]) 12/23/2016  . Uncontrolled diabetes mellitus type 2 without complications ([place]) 09/01/4130   Past Surgical History:  Procedure Laterality Date  . ABDOMINAL SURGERY     as a child  . ESOPHAGOGASTRODUODENOSCOPY  08/02/2014   mild chronic nonspecific gastritis, stomach and endoscopic biopises. reflux esophagitis  . ESOPHAGOGASTRODUODENOSCOPY (EGD) WITH ESOPHAGEAL DILATION     mild chronic stomach gastritis per record in 2016  . NASAL SEPTUM SURGERY    . TONSILLECTOMY    . VASECTOMY      FAMILY HISTORY Family History  Problem Relation Age of Onset  . Lung cancer Mother 48  . Lung cancer Maternal Grandfather   . Colon cancer Paternal Grandmother   . CAD Brother     SOCIAL HISTORY Social History   Tobacco Use  . Smoking status: Never Smoker  . Smokeless tobacco: Current User    Types: Snuff  Substance Use Topics  . 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 Drug use: No         OPHTHALMIC EXAM:  Base Eye Exam    Visual Acuity (Snellen - Linear)      Right Left   Dist May 20/25 +2 20/25 +2   Dist ph Lind 20/20 -1 NI       Tonometry (Tonopen, 9:20 AM)      Right Left   Pressure 16 16       Pupils      Dark Light Shape React APD   Right 5 4 Round Brisk None   Left 5 4 Round Brisk None       Visual Fields (Counting fingers)      Left Right    Full Full       Extraocular Movement      Right Left    Ortho Ortho    -- -- --  --  --  -- -- --   -- -- --  --  --  -- -- --         Neuro/Psych    Oriented x3:  Yes       Dilation    Both eyes:  1.0% Mydriacyl, 2.5% Phenylephrine @ 9:20 AM        Slit Lamp and Fundus Exam    Slit Lamp Exam      Right Left   Lids/Lashes Dermatochalasis - upper lid Dermatochalasis - upper lid   Conjunctiva/Sclera White and quiet White and quiet   Cornea Arcus  Arcus   Anterior Chamber Deep and quiet Deep and quiet   Iris Round and reactive Round and reactive   Lens 1-2+ Nuclear sclerosis, 1-2+ Cortical cataract 1-2+ Nuclear sclerosis, 1-2+ Cortical cataract   Vitreous Vitreous syneresis Vitreous syneresis       Fundus Exam  Right Left   Disc Tilted disc, Temporal Peripapillary atrophy Tilted disc, Temporal Peripapillary atrophy   C/D Ratio 0.4 0.5   Macula Flat, Good foveal reflex, mild Retinal pigment epithelial mottling, scattered Microaneurysms Good foveal reflex, scattered Microaneurysms nasal to fovea, Retinal pigment epithelial mottling, no edema   Vessels Normal Normal   Periphery Attached, No heme Attached, No heme        Refraction    Manifest Refraction      Sphere Cylinder Dist VA   Right +0.75 Sphere 20/20   Left +0.50 Sphere 20/20-2          IMAGING AND PROCEDURES  Imaging and Procedures for @TODAY @  OCT, Retina - OU - Both Eyes       Right Eye Quality was good. Central Foveal Thickness: 317. Progression has no prior data. Findings include normal foveal contour, no IRF, no SRF (blunted foveal reflex, single drusen).   Left Eye Quality was good. Central Foveal Thickness: 328. Progression has no prior data. Findings include normal foveal contour, no IRF, no SRF (Blunted foveal reflex).   Notes *Images captured and stored on drive  Diagnosis / Impression:  NFP, No IRF/SRF, No DME OU  Clinical management:  See below  Abbreviations: NFP - Normal foveal profile. CME - cystoid macular edema. PED - pigment epithelial detachment. IRF - intraretinal fluid. SRF - subretinal fluid. EZ - ellipsoid zone. ERM - epiretinal membrane. ORA - outer retinal atrophy. ORT - outer retinal tubulation. SRHM - subretinal hyper-reflective material                  ASSESSMENT/PLAN:    ICD-10-CM   1. Mild nonproliferative diabetic retinopathy of both eyes without macular edema associated with type 2 diabetes mellitus  ([place]) W23.7628   2. Essential hypertension I10   3. Hypertensive retinopathy of both eyes H35.033   4. Retinal edema H35.81 OCT, Retina - OU - Both Eyes  5. Combined forms of age-related cataract of both eyes H25.813     1. Mild Non-proliferative diabetic retinopathy w/o DME, OU - The incidence, risk factors for progression, natural history and treatment options for diabetic retinopathy were discussed with patient.   - The need for close monitoring of blood glucose, blood pressure, and serum lipids, avoiding cigarette or any type of tobacco, and the need for long term follow up was also discussed with patient. - exam shows scattered MA OU; no NV - OCT without diabetic macular edema OU - f/u in 6 months  2, 3. Hypertensive retinopathy OU - discussed importance of tight BP control - monitor  4. No retinal edema on exam or OCT  5. Combined form age-related cataract OU - The symptoms of cataract, surgical options, and treatments and risks were discussed with patient. - discussed diagnosis and progression - not yet visually significant - monitor for now   Ophthalmic Meds Ordered this visit:  No orders of the defined types were placed in this encounter.      Return in about 6 months (around 06/04/2018) for F/U NPDR OU, DFE, OCT.  There are no Patient Instructions on file for this visit.   Explained the diagnoses, plan, and follow up with the patient and they expressed understanding.  Patient expressed understanding of the importance of proper follow up care.   This document serves as a record of services personally performed by Gardiner Sleeper, MD, PhD. It was created on their behalf by Ernest Mallick, OA, an ophthalmic assistant. The creation of this  record is the provider's dictation and/or activities during the visit.    Electronically signed by: Ernest Mallick, OA  08.29.2019 1:09 PM   This document serves as a record of services personally performed by Gardiner Sleeper, MD, PhD.  It was created on their behalf by Catha Brow, [place], a certified ophthalmic assistant. The creation of this record is the provider's dictation and/or activities during the visit.  Electronically signed by: Catha Brow, [place]  08.30.19 1:09 PM    Gardiner Sleeper, M.D., Ph.D. Diseases & Surgery of the Retina and Vitreous [facility]   I have reviewed the above documentation for accuracy and completeness, and I agree with the above. Gardiner Sleeper, M.D., Ph.D. 12/03/17 1:11 PM     Abbreviations: M myopia (nearsighted); A astigmatism; H hyperopia (farsighted); P presbyopia; Mrx spectacle prescription;  CTL contact lenses; OD right eye; OS left eye; OU both eyes  XT exotropia; ET esotropia; PEK punctate epithelial keratitis; PEE punctate epithelial erosions; DES dry eye syndrome; MGD meibomian gland dysfunction; ATs artificial tears; PFAT's preservative free artificial tears; [place] nuclear sclerotic cataract; PSC posterior subcapsular cataract; ERM epi-retinal membrane; PVD posterior vitreous detachment; RD retinal detachment; DM diabetes mellitus; DR diabetic retinopathy; NPDR non-proliferative diabetic retinopathy; PDR proliferative diabetic retinopathy; CSME clinically significant macular edema; DME diabetic macular edema; dbh dot blot hemorrhages; CWS cotton wool spot; POAG primary open angle glaucoma; C/D cup-to-disc ratio; HVF humphrey visual field; GVF goldmann visual field; OCT optical coherence tomography; IOP intraocular pressure; BRVO Branch retinal vein occlusion; CRVO central retinal vein occlusion; CRAO central retinal artery occlusion; BRAO branch retinal artery occlusion; RT retinal tear; SB scleral buckle; PPV pars plana vitrectomy; VH Vitreous hemorrhage; PRP panretinal laser photocoagulation; IVK intravitreal kenalog; VMT vitreomacular traction; MH Macular hole;  NVD neovascularization of the disc; NVE neovascularization elsewhere; AREDS age related eye  disease study; ARMD age related macular degeneration; POAG primary open angle glaucoma; EBMD epithelial/anterior basement membrane dystrophy; ACIOL anterior chamber intraocular lens; IOL intraocular lens; PCIOL posterior chamber intraocular lens; Phaco/IOL phacoemulsification with intraocular lens placement; [place] photorefractive keratectomy; LASIK laser assisted in situ keratomileusis; HTN hypertension; DM diabetes mellitus; COPD chronic obstructive pulmonary disease

## 2017-12-02 NOTE — Telephone Encounter (Signed)
Called pt to call back and verify how he is taking his metformin and not exceeding med.  He should be taking 1 tablet twice a day of 1000mg 

## 2017-12-03 ENCOUNTER — Ambulatory Visit (INDEPENDENT_AMBULATORY_CARE_PROVIDER_SITE_OTHER): Payer: No Typology Code available for payment source | Admitting: Ophthalmology

## 2017-12-03 ENCOUNTER — Encounter (INDEPENDENT_AMBULATORY_CARE_PROVIDER_SITE_OTHER): Payer: Self-pay | Admitting: Ophthalmology

## 2017-12-03 DIAGNOSIS — E113293 Type 2 diabetes mellitus with mild nonproliferative diabetic retinopathy without macular edema, bilateral: Secondary | ICD-10-CM | POA: Diagnosis not present

## 2017-12-03 DIAGNOSIS — H3581 Retinal edema: Secondary | ICD-10-CM

## 2017-12-03 DIAGNOSIS — I10 Essential (primary) hypertension: Secondary | ICD-10-CM | POA: Diagnosis not present

## 2017-12-03 DIAGNOSIS — H35033 Hypertensive retinopathy, bilateral: Secondary | ICD-10-CM | POA: Diagnosis not present

## 2017-12-03 DIAGNOSIS — H25813 Combined forms of age-related cataract, bilateral: Secondary | ICD-10-CM

## 2017-12-08 NOTE — Telephone Encounter (Signed)
Pt called back and states he is taking 1,000mg  twice a day (2,000mg  total)

## 2017-12-21 ENCOUNTER — Other Ambulatory Visit: Payer: Self-pay | Admitting: Family Medicine

## 2017-12-21 NOTE — Telephone Encounter (Signed)
[facility] Please advise Kh

## 2017-12-24 ENCOUNTER — Encounter: Payer: No Typology Code available for payment source | Admitting: Family Medicine

## 2017-12-27 ENCOUNTER — Encounter: Payer: Self-pay | Admitting: Family Medicine

## 2017-12-27 ENCOUNTER — Ambulatory Visit (INDEPENDENT_AMBULATORY_CARE_PROVIDER_SITE_OTHER): Payer: No Typology Code available for payment source | Admitting: Family Medicine

## 2017-12-27 VITALS — BP 120/70 | HR 80 | Ht 69.0 in | Wt 183.0 lb

## 2017-12-27 DIAGNOSIS — R7309 Other abnormal glucose: Secondary | ICD-10-CM | POA: Diagnosis not present

## 2017-12-27 DIAGNOSIS — F329 Major depressive disorder, single episode, unspecified: Secondary | ICD-10-CM

## 2017-12-27 DIAGNOSIS — Z23 Encounter for immunization: Secondary | ICD-10-CM

## 2017-12-27 DIAGNOSIS — E1165 Type 2 diabetes mellitus with hyperglycemia: Secondary | ICD-10-CM | POA: Diagnosis not present

## 2017-12-27 DIAGNOSIS — Z720 Tobacco use: Secondary | ICD-10-CM

## 2017-12-27 DIAGNOSIS — R81 Glycosuria: Secondary | ICD-10-CM

## 2017-12-27 DIAGNOSIS — E1159 Type 2 diabetes mellitus with other circulatory complications: Secondary | ICD-10-CM | POA: Diagnosis not present

## 2017-12-27 DIAGNOSIS — Z0000 Encounter for general adult medical examination without abnormal findings: Secondary | ICD-10-CM | POA: Diagnosis not present

## 2017-12-27 DIAGNOSIS — E133299 Other specified diabetes mellitus with mild nonproliferative diabetic retinopathy without macular edema, unspecified eye: Secondary | ICD-10-CM

## 2017-12-27 DIAGNOSIS — I10 Essential (primary) hypertension: Secondary | ICD-10-CM

## 2017-12-27 DIAGNOSIS — F419 Anxiety disorder, unspecified: Secondary | ICD-10-CM | POA: Diagnosis not present

## 2017-12-27 DIAGNOSIS — F32A Depression, unspecified: Secondary | ICD-10-CM

## 2017-12-27 DIAGNOSIS — G4739 Other sleep apnea: Secondary | ICD-10-CM

## 2017-12-27 DIAGNOSIS — I152 Hypertension secondary to endocrine disorders: Secondary | ICD-10-CM

## 2017-12-27 DIAGNOSIS — G4719 Other hypersomnia: Secondary | ICD-10-CM

## 2017-12-27 DIAGNOSIS — D229 Melanocytic nevi, unspecified: Secondary | ICD-10-CM

## 2017-12-27 DIAGNOSIS — K219 Gastro-esophageal reflux disease without esophagitis: Secondary | ICD-10-CM | POA: Diagnosis not present

## 2017-12-27 DIAGNOSIS — Z125 Encounter for screening for malignant neoplasm of prostate: Secondary | ICD-10-CM

## 2017-12-27 LAB — POCT URINALYSIS DIP (PROADVANTAGE DEVICE)
BILIRUBIN UA: NEGATIVE
Ketones, POC UA: NEGATIVE mg/dL
LEUKOCYTES UA: NEGATIVE
Nitrite, UA: NEGATIVE
Protein Ur, POC: NEGATIVE mg/dL
RBC UA: NEGATIVE
Specific Gravity, Urine: 1.015
UUROB: NEGATIVE
pH, UA: 7.5 (ref 5.0–8.0)

## 2017-12-27 LAB — GLUCOSE, POCT (MANUAL RESULT ENTRY): POC Glucose: 191 mg/dl — AB (ref 70–99)

## 2017-12-27 LAB — POCT GLYCOSYLATED HEMOGLOBIN (HGB A1C): HEMOGLOBIN A1C: 7.5 % — AB (ref 4.0–5.6)

## 2017-12-27 NOTE — Progress Notes (Signed)
Subjective:    Patient ID: Jacob Hart, male    DOB: 01/07/1966, 52 y.o.   MRN: [id number]  HPI Chief Complaint  Patient presents with  . cpe    fasting cpe, wanting to quit dipping. would like something else than prilosec., flu shot given today   He is here for a complete physical exam and to follow up on chronic health conditions including diabetes, HTN, GERD and anxiety/depression.  Diabetes- does not check blood sugar. Reports eating a lot of pizza and pasta. He also eats a lot of watermelon. Walks 2-3 days per week.  Reports some mild tingling in his feet, intermittent and not bothersome.   Reports taking daily enalapril and statin without side effects.   GERD- taking omeprazole daily or has severe reflux otherwise. No dysphagia. History of esophageal dilation. Not bothersome. Declines GI referral now.   Has been dipping for 35 years and wants to stop. He has tried to stop in the past.   States his wife has told him that he stops breathing at night.  He wakes up with headaches and reports feeling very tired during the day even after 8 hours of sleep.   Other providers: Dr. Coralyn Pear- eyes  Dentist- [facility] in [place].   Social history: Lives with his wife, works as a Furniture conservator/restorer. His one year anniversary is this weekend.  Denies smoking, drinking alcohol, drug use Diet: poor Exercise: walking some days   Immunizations: Tdap UTD. Needs flu and Prevnar 13. Will check on Shingrix.   Health maintenance:  Colonoscopy: 2016 and due for recall in 2021  Last PSA: 2018 Last Dental Exam: twice annually  Last Eye Exam: UTD and has a follow up scheduled in 6 months  Wears seatbelt always, uses sunscreen, smoke detectors in home and functioning, does not text while driving, feels safe in home environment.  Reviewed allergies, medications, past medical, surgical, family, and social history.   Review of Systems Review of Systems Constitutional: -fever, -chills,  -sweats, -unexpected weight change,-fatigue ENT: -runny nose, -ear pain, -sore throat Cardiology:  -chest pain, -palpitations, -edema Respiratory: -cough, -shortness of breath, -wheezing Gastroenterology: -abdominal pain, -nausea, -vomiting, -diarrhea, -constipation  Hematology: -bleeding or bruising problems Musculoskeletal: -arthralgias, -myalgias, -joint swelling, -back pain Ophthalmology: -vision changes Urology: -dysuria, -difficulty urinating, -hematuria, -urinary frequency, -urgency Neurology: -headache, -weakness, -tingling, -numbness       Objective:   Physical Exam BP 120/70   Pulse 80   Ht 5\' 9"  (1.753 m)   Wt 183 lb (83 kg)   BMI 27.02 kg/m   General Appearance:    Alert, cooperative, no distress, appears stated age  Head:    Normocephalic, without obvious abnormality, atraumatic  Eyes:    PERRL, conjunctiva/corneas clear, EOM's intact, fundi    benign  Ears:    Normal TM's and external ear canals  Nose:   Nares normal, mucosa normal, no drainage or sinus   tenderness  Throat:   Lips, mucosa, and tongue normal; teeth and gums normal  Neck:   Supple, no lymphadenopathy;  thyroid:  no   enlargement/tenderness/nodules; no carotid   bruit or JVD  Back:    Spine nontender, no curvature, ROM normal, no CVA     tenderness  Lungs:     Clear to auscultation bilaterally without wheezes, rales or     ronchi; respirations unlabored  Chest Wall:    No tenderness or deformity   Heart:    Regular rate and rhythm, S1 and S2  normal, no murmur, rub   or gallop  Breast Exam:    No chest wall tenderness, masses or gynecomastia  Abdomen:     Soft, non-tender, nondistended, normoactive bowel sounds,    no masses, no hepatosplenomegaly  Genitalia:    Declines      Extremities:   No clubbing, cyanosis or edema  Pulses:   2+ and symmetric all extremities  Skin:   Skin color, texture, turgor normal. Black nevus on the top of his scalp.   Lymph nodes:   Cervical, supraclavicular, and  axillary nodes normal  Neurologic:   CNII-XII intact, normal strength, sensation and gait; reflexes 2+ and symmetric throughout          Psych:   Normal mood, affect, hygiene and grooming.     Urinalysis dipstick: glucose >1,000 negative otherwise      Assessment & Plan:  Routine general medical examination at a health care facility - Plan: POCT Urinalysis DIP (Proadvantage Device), CBC with Differential/Platelet, Comprehensive metabolic panel, TSH, T4, free, Lipid panel  Anxiety and depression  Hypertension associated with diabetes ([place]) - Plan: CBC with Differential/Platelet, Comprehensive metabolic panel  Gastroesophageal reflux disease, esophagitis presence not specified  Need for vaccination - Plan: Pneumococcal conjugate vaccine 13-valent, Flu Vaccine QUAD 36+ mos IM  Type 2 diabetes mellitus with hyperglycemia, unspecified whether long term insulin use ([place]) - Plan: POCT Urinalysis DIP (Proadvantage Device), Microalbumin/Creatinine Ratio, Urine, HgB A1c, Pneumococcal conjugate vaccine 13-valent, CBC with Differential/Platelet, Comprehensive metabolic panel, POCT glucose (manual entry), TSH, T4, free, Lipid panel  Sleep apnea-like behavior - Plan: Home sleep test  Excessive daytime sleepiness - Plan: Home sleep test  Tobacco chew use  Atypical nevi  Screening for prostate cancer - Plan: PSA  Elevated serum glucose with glucosuria - Plan: POCT glucose (manual entry)  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 POCT fasting glucose 191 Asymptomatic.  Discussed that his blood sugar is not controlled and discussed potential worsening health.  Discussed adding medication and he declines. Continue on Metformin and Jardiance for now. States he can make diet changes since his diet has been poor. He exercises some but discussed increasing this. He will cut back on sweets and carbohydrates.    Start checking his blood sugars 2-3 times weekly at least.  HTN- well controlled. Continue on enalapril.  Continue on statin. No side effects.  GERD- does well as long as he takes PPI. History of esophageal dilation but states he is not having issues with food getting stuck. He will keep me posted and needs referral to local GI in future. Colonoscopy due in 2021 per records.  His wife reports periods of apnea and he does report daytime sleepiness. Never had a sleep study.  Epworth 11 Refer for sleep study.  Follow up with Dr. Coralyn Pear for retinopathy.  See dermatology for new atypical nevi on scalp. Advised to wear sunscreen and hat.  He would like to stop chewing tobacco. Discussed options. He will call and schedule classes offered by [facility].  Vaccine counseling done. He will check on Shingrix and let us know.  Prevnar 13 given Flu shot given  Follow up in 4 months for diabetes or sooner pending labs.

## 2017-12-27 NOTE — Patient Instructions (Addendum)
Your fasting blood sugar today is 191 and you have sugar in your urine.  Your hemoglobin A1c is 7.5%   Continue on your current medications.  Start checking your blood sugar 2-3 times per week at least.  Goal fasting blood sugar is 80-130 and 2 hours after a meal goal is 130-160.  Cut back on carbohydrates and sweets.  Make sure you are getting at least 150 minutes of physical activity per week.   Call and schedule a dermatology appointment.   You should receive a call regarding a sleep study. Let us know if you do not in a few days.   Call and schedule the classes to stop smokeless tobacco.   Call and ask your insurance company regarding the Shingrix vaccine to prevent shingles. This is a 2 shot series.  If you decide to get this, call and schedule a nurse visit.   We will call you with your lab results.    Dermatology offices  [facility]: Phone #: [phone] Address: [address], [place], [place] [address]  [facility]: Phone: [phone]  Address: [address] [place], [place] [address]  [facility] Address: [address] [place], [place], [place] [address] Phone: [phone]   Preventative Care for Adults, Male       [place]:  A routine yearly physical is a good way to check in with your primary care provider about your health and preventive screening. It is also an opportunity to share updates about your health and any concerns you have, and receive a thorough all-over exam.   Most health insurance companies pay for at least some preventative services.  Check with your health plan for specific coverages.  WHAT PREVENTATIVE SERVICES DO MEN NEED?  Adult men should have their weight and blood pressure checked regularly.   Men age 25 and older should have their cholesterol levels checked regularly.  Beginning at age 17 and continuing to age 50, men should be screened for colorectal cancer.  Certain people  should may need continued testing until age 11.  Other cancer screening may include exams for testicular and prostate cancer.  Updating vaccinations is part of preventative care.  Vaccinations help protect against diseases such as the flu.  Lab tests are generally done as part of preventative care to screen for anemia and blood disorders, to screen for problems with the kidneys and liver, to screen for bladder problems, to check blood sugar, and to check your cholesterol level.  Preventative services generally include counseling about diet, exercise, avoiding tobacco, drugs, excessive alcohol consumption, and sexually transmitted infections.    GENERAL RECOMMENDATIONS FOR GOOD HEALTH:  Healthy diet:  Eat a variety of foods, including fruit, vegetables, animal or vegetable protein, such as meat, fish, chicken, and eggs, or beans, lentils, tofu, and grains, such as rice.  Drink plenty of water daily.  Decrease saturated fat in the diet, avoid lots of red meat, processed foods, sweets, fast foods, and fried foods.  Exercise:  Aerobic exercise helps maintain good heart health. At least 30-40 minutes of moderate-intensity exercise is recommended. For example, a brisk walk that increases your heart rate and breathing. This should be done on most days of the week.   Find a type of exercise or a variety of exercises that you enjoy so that it becomes a part of your daily life.  Examples are running, walking, swimming, water aerobics, and biking.  For motivation and support, explore group exercise such as aerobic class,  spin class, Zumba, Yoga,or  martial arts, etc.    Set exercise goals for yourself, such as a certain weight goal, walk or run in a race such as a 5k walk/run.  Speak to your primary care provider about exercise goals.  Disease prevention:  If you smoke or chew tobacco, find out from your caregiver how to quit. It can literally save your life, no matter how long you have been a  tobacco user. If you do not use tobacco, never begin.   Maintain a healthy diet and normal weight. Increased weight leads to problems with blood pressure and diabetes.   The Body Mass Index or BMI is a way of measuring how much of your body is fat. Having a BMI above 27 increases the risk of heart disease, diabetes, hypertension, stroke and other problems related to obesity. Your caregiver can help determine your BMI and based on it develop an exercise and dietary program to help you achieve or maintain this important measurement at a healthful level.  High blood pressure causes heart and blood vessel problems.  Persistent high blood pressure should be treated with medicine if weight loss and exercise do not work.   Fat and cholesterol leaves deposits in your arteries that can block them. This causes heart disease and vessel disease elsewhere in your body.  If your cholesterol is found to be high, or if you have heart disease or certain other medical conditions, then you may need to have your cholesterol monitored frequently and be treated with medication.   Ask if you should have a stress test if your history suggests this. A stress test is a test done on a treadmill that looks for heart disease. This test can find disease prior to there being a problem.  Avoid drinking alcohol in excess (more than two drinks per day).  Avoid use of street drugs. Do not share needles with anyone. Ask for professional help if you need assistance or instructions on stopping the use of alcohol, cigarettes, and/or drugs.  Brush your teeth twice a day with fluoride toothpaste, and floss once a day. Good oral hygiene prevents tooth decay and gum disease. The problems can be painful, unattractive, and can cause other health problems. Visit your dentist for a routine oral and dental check up and preventive care every 6-12 months.   Look at your skin regularly.  Use a mirror to look at your back. Notify your caregivers of  changes in moles, especially if there are changes in shapes, colors, a size larger than a pencil eraser, an irregular border, or development of new moles.  Safety:  Use seatbelts 100% of the time, whether driving or as a passenger.  Use safety devices such as hearing protection if you work in environments with loud noise or significant background noise.  Use safety glasses when doing any work that could send debris in to the eyes.  Use a helmet if you ride a bike or motorcycle.  Use appropriate safety gear for contact sports.  Talk to your caregiver about gun safety.  Use sunscreen with a SPF (or skin protection factor) of 15 or greater.  Lighter skinned people are at a greater risk of skin cancer. Don't forget to also wear sunglasses in order to protect your eyes from too much damaging sunlight. Damaging sunlight can accelerate cataract formation.   Practice safe sex. Use condoms. Condoms are used for birth control and to help reduce the spread of sexually transmitted infections (or STIs).  Some of the STIs are gonorrhea (the clap), chlamydia, syphilis, trichomonas, herpes, HPV (human papilloma virus) and HIV (human immunodeficiency virus) which causes AIDS. The herpes, HIV and HPV are viral illnesses that have no cure. These can result in disability, cancer and death.   Keep carbon monoxide and smoke detectors in your home functioning at all times. Change the batteries every 6 months or use a model that plugs into the wall.   Vaccinations:  Stay up to date with your tetanus shots and other required immunizations. You should have a booster for tetanus every 10 years. Be sure to get your flu shot every year, since 5%-20% of the [place] population comes down with the flu. The flu vaccine changes each year, so being vaccinated once is not enough. Get your shot in the fall, before the flu season peaks.   Other vaccines to consider:  Pneumococcal vaccine to protect against certain types of pneumonia.  This  is normally recommended for adults age 88 or older.  However, adults younger than 51 years old with certain underlying conditions such as diabetes, heart or lung disease should also receive the vaccine.  Shingles vaccine to protect against Varicella Zoster if you are older than age 77, or younger than 51 years old with certain underlying illness.  Hepatitis A vaccine to protect against a form of infection of the liver by a virus acquired from food.  Hepatitis B vaccine to protect against a form of infection of the liver by a virus acquired from blood or body fluids, particularly if you work in health care.  If you plan to travel internationally, check with your local health department for specific vaccination recommendations.  Cancer Screening:  Most routine colon cancer screening begins at the age of 61. On a yearly basis, doctors may provide special easy to use take-home tests to check for hidden blood in the stool. Sigmoidoscopy or colonoscopy can detect the earliest forms of colon cancer and is life saving. These tests use a small camera at the end of a tube to directly examine the colon. Speak to your caregiver about this at age 62, when routine screening begins (and is repeated every 5 years unless early forms of pre-cancerous polyps or small growths are found).   At the age of 46 men usually start screening for prostate cancer every year. Screening may begin at a younger age for those with higher risk. Those at higher risk include African-Americans or having a family history of prostate cancer. There are two types of tests for prostate cancer:   Prostate-specific antigen (PSA) testing. Recent studies raise questions about prostate cancer using PSA and you should discuss this with your caregiver.   Digital rectal exam (in which your doctor's lubricated and gloved finger feels for enlargement of the prostate through the anus).   Screening for testicular cancer.  Do a monthly exam of your  testicles. Gently roll each testicle between your thumb and fingers, feeling for any abnormal lumps. The best time to do this is after a hot shower or bath when the tissues are looser. Notify your caregivers of any lumps, tenderness or changes in size or shape immediately.

## 2017-12-28 LAB — COMPREHENSIVE METABOLIC PANEL
ALBUMIN: 4.9 g/dL (ref 3.5–5.5)
ALK PHOS: 80 IU/L (ref 39–117)
ALT: 23 IU/L (ref 0–44)
AST: 15 IU/L (ref 0–40)
Albumin/Globulin Ratio: 2.6 — ABNORMAL HIGH (ref 1.2–2.2)
BUN / CREAT RATIO: 17 (ref 9–20)
BUN: 14 mg/dL (ref 6–24)
Bilirubin Total: 0.4 mg/dL (ref 0.0–1.2)
CO2: 25 mmol/L (ref 20–29)
CREATININE: 0.83 mg/dL (ref 0.76–1.27)
Calcium: 9.5 mg/dL (ref 8.7–10.2)
Chloride: 98 mmol/L (ref 96–106)
GFR, EST AFRICAN AMERICAN: 118 mL/min/{1.73_m2} (ref >59)
GFR, EST NON AFRICAN AMERICAN: 102 mL/min/{1.73_m2} (ref >59)
GLOBULIN, TOTAL: 1.9 g/dL (ref 1.5–4.5)
GLUCOSE: 185 mg/dL — AB (ref 65–99)
Potassium: 4.4 mmol/L (ref 3.5–5.2)
Sodium: 140 mmol/L (ref 134–144)
TOTAL PROTEIN: 6.8 g/dL (ref 6.0–8.5)

## 2017-12-28 LAB — CBC WITH DIFFERENTIAL/PLATELET
Basophils Absolute: 0 10*3/uL (ref 0.0–0.2)
Basos: 1 %
EOS (ABSOLUTE): 0.1 10*3/uL (ref 0.0–0.4)
EOS: 1 %
HEMATOCRIT: 48.1 % (ref 37.5–51.0)
Hemoglobin: 16.5 g/dL (ref 13.0–17.7)
Immature Grans (Abs): 0 10*3/uL (ref 0.0–0.1)
Immature Granulocytes: 0 %
LYMPHS ABS: 1.5 10*3/uL (ref 0.7–3.1)
Lymphs: 23 %
MCH: 31.5 pg (ref 26.6–33.0)
MCHC: 34.3 g/dL (ref 31.5–35.7)
MCV: 92 fL (ref 79–97)
MONOS ABS: 0.5 10*3/uL (ref 0.1–0.9)
Monocytes: 9 %
Neutrophils Absolute: 4.1 10*3/uL (ref 1.4–7.0)
Neutrophils: 66 %
PLATELETS: 241 10*3/uL (ref 150–450)
RBC: 5.24 x10E6/uL (ref 4.14–5.80)
RDW: 12.8 % (ref 12.3–15.4)
WBC: 6.3 10*3/uL (ref 3.4–10.8)

## 2017-12-28 LAB — LIPID PANEL
CHOLESTEROL TOTAL: 122 mg/dL (ref 100–199)
Chol/HDL Ratio: 3.6 ratio (ref 0.0–5.0)
HDL: 34 mg/dL — AB (ref >39)
LDL CALC: 71 mg/dL (ref 0–99)
TRIGLYCERIDES: 85 mg/dL (ref 0–149)
VLDL CHOLESTEROL CAL: 17 mg/dL (ref 5–40)

## 2017-12-28 LAB — MICROALBUMIN / CREATININE URINE RATIO: Creatinine, Urine: 39.4 mg/dL

## 2017-12-28 LAB — T4, FREE: FREE T4: 1.06 ng/dL (ref 0.82–1.77)

## 2017-12-28 LAB — PSA: PROSTATE SPECIFIC AG, SERUM: 2.7 ng/mL (ref 0.0–4.0)

## 2017-12-28 LAB — TSH: TSH: 2.61 u[IU]/mL (ref 0.450–4.500)

## 2018-01-06 ENCOUNTER — Other Ambulatory Visit: Payer: Self-pay | Admitting: Family Medicine

## 2018-01-13 ENCOUNTER — Other Ambulatory Visit: Payer: Self-pay | Admitting: Family Medicine

## 2018-01-13 NOTE — Telephone Encounter (Signed)
[facility] is requesting to fill pt [facility]. Please advise KH 

## 2018-02-04 ENCOUNTER — Ambulatory Visit (HOSPITAL_BASED_OUTPATIENT_CLINIC_OR_DEPARTMENT_OTHER): Payer: No Typology Code available for payment source | Attending: Family Medicine | Admitting: Internal Medicine

## 2018-02-04 DIAGNOSIS — G4710 Hypersomnia, unspecified: Secondary | ICD-10-CM | POA: Diagnosis not present

## 2018-02-04 DIAGNOSIS — G4733 Obstructive sleep apnea (adult) (pediatric): Secondary | ICD-10-CM | POA: Insufficient documentation

## 2018-02-04 DIAGNOSIS — G4739 Other sleep apnea: Secondary | ICD-10-CM | POA: Diagnosis present

## 2018-02-04 DIAGNOSIS — G4719 Other hypersomnia: Secondary | ICD-10-CM

## 2018-02-07 ENCOUNTER — Telehealth: Payer: Self-pay | Admitting: Family Medicine

## 2018-02-07 MED ORDER — GLUCOSE BLOOD VI STRP: ORAL_STRIP | 2 refills | Status: DC

## 2018-02-07 NOTE — Telephone Encounter (Signed)
done

## 2018-02-07 NOTE — Telephone Encounter (Signed)
Pt requesting refill on test strips [facility] in [place], [place] for One Touch Verio machine. Pt tests once a day.

## 2018-02-12 DIAGNOSIS — G4739 Other sleep apnea: Secondary | ICD-10-CM

## 2018-02-12 DIAGNOSIS — G4719 Other hypersomnia: Secondary | ICD-10-CM

## 2018-02-12 NOTE — Procedures (Signed)
    Patient Name: Jacob Hart, Jacob Hart Date: 02/05/2018 Gender: Male D.O.B: [birth year] Age (years): 44 Referring Provider: Girtha Rm NP Height (inches): 43 Interpreting Physician: Baird Lyons MD, ABSM Weight (lbs): 180 RPSGT: Jacolyn Reedy BMI: 27 MRN: [id number] Neck Size: 15.50  CLINICAL INFORMATION Sleep Study Type: HST Indication for sleep study: Excessive Daytime Sleepiness  Epworth Sleepiness Score: 8  SLEEP STUDY TECHNIQUE A multi-channel overnight portable sleep study was performed. The channels recorded were: nasal airflow, thoracic respiratory movement, and oxygen saturation with a pulse oximetry. Snoring was also monitored.  MEDICATIONS Patient self administered medications include: none reported.  SLEEP ARCHITECTURE Patient was studied for 439.4 minutes. The sleep efficiency was 98.2 % and the patient was supine for 60.1%. The arousal index was 0.0 per hour.  RESPIRATORY PARAMETERS The overall AHI was 8.1 per hour, with a central apnea index of 0.0 per hour. The oxygen nadir was 81% during sleep.  CARDIAC DATA Mean heart rate during sleep was 66.1 bpm.  IMPRESSIONS - Mild obstructive sleep apnea occurred during this study (AHI = 8.1/h). - No significant central sleep apnea occurred during this study (CAI = 0.0/h). - Oxygen desaturation was noted during this study (Min O2 = 81%). Mean 92%. - Patient snored.  DIAGNOSIS - Obstructive Sleep Apnea (327.23 [G47.33 ICD-10])  RECOMMENDATIONS - Treatment for mild OSA is directed at symptoms, with consideration of comorbidities. Conservative options may include observation, weight loss, sleep position off back. Other options, including CPAP or a fitted oral appliance, would be based on clinical judgment. - Be careful with alcohol, sedatives and other CNS depressants that may worsen sleep apnea and disrupt normal sleep architecture. - Sleep hygiene should be reviewed to assess factors that may improve  sleep quality. - Weight management and regular exercise should be initiated or continued.  [Electronically signed] 02/12/2018 09:37 AM  Baird Lyons MD, ABSM Diplomate, American Board of Sleep Medicine   NPI: [id number]                         [place], [place] [facility]  ELECTRONICALLY SIGNED ON:  02/12/2018, 9:33 AM [place] PH: [phone]   FX: [phone] [place]

## 2018-02-15 ENCOUNTER — Ambulatory Visit: Payer: No Typology Code available for payment source | Admitting: Family Medicine

## 2018-02-15 ENCOUNTER — Encounter: Payer: Self-pay | Admitting: Family Medicine

## 2018-02-15 VITALS — BP 120/80 | HR 82 | Wt 182.8 lb

## 2018-02-15 DIAGNOSIS — E119 Type 2 diabetes mellitus without complications: Secondary | ICD-10-CM | POA: Diagnosis not present

## 2018-02-15 DIAGNOSIS — E1159 Type 2 diabetes mellitus with other circulatory complications: Secondary | ICD-10-CM | POA: Diagnosis not present

## 2018-02-15 DIAGNOSIS — R208 Other disturbances of skin sensation: Secondary | ICD-10-CM

## 2018-02-15 DIAGNOSIS — E1165 Type 2 diabetes mellitus with hyperglycemia: Secondary | ICD-10-CM | POA: Diagnosis not present

## 2018-02-15 DIAGNOSIS — R350 Frequency of micturition: Secondary | ICD-10-CM | POA: Diagnosis not present

## 2018-02-15 DIAGNOSIS — I152 Hypertension secondary to endocrine disorders: Secondary | ICD-10-CM

## 2018-02-15 DIAGNOSIS — I10 Essential (primary) hypertension: Secondary | ICD-10-CM

## 2018-02-15 LAB — POCT CBG (FASTING - GLUCOSE)-MANUAL ENTRY: GLUCOSE FASTING, POC: 164 mg/dL — AB (ref 70–99)

## 2018-02-15 MED ORDER — EXENATIDE ER 2 MG/0.85ML ~~LOC~~ AUIJ: 1.0000 | AUTO-INJECTOR | SUBCUTANEOUS | 2 refills | Status: DC

## 2018-02-15 NOTE — Addendum Note (Signed)
Addended by: Minette Headland A on: 02/15/2018 03:24 PM   Modules accepted: Orders

## 2018-02-15 NOTE — Progress Notes (Signed)
Subjective:    Patient ID: Jacob Hart, male    DOB: 11-Mar-1966, 52 y.o.   MRN: [id number]  HPI Chief Complaint  Patient presents with  . DIabetes    questions about DM. burning sensation in feet-worried, BS >150- worried why they aren't coming down.  discuss sleep apnea results   He is here with concerns about hyperglycemia and worsening burning of his feet.  Denies numbness, tingling, weakness.  States he is not having pain but he is having a warm sensation in his feet.  This has been ongoing for the past 2 months.  No leg or calf pain with walking.  States his FBS has been between 150s-225. Reports good compliance with twice daily Metformin and once daily Jardiance.  He is very concerned that his diabetes is no longer controlled.   Hgb A1c 7.5% 12/27/2017   States his diet is very high in carbohydrates and sugar.  States for breakfast he has a large bowl of cereal, lunch is usually out of a can such as canned fruit and Vianna sausages.  States his supper is typically a large bowl of rice with beans and vegetables. He is not currently exercising. States he has been on insulin in the past for a brief.  But was able to come off of it pretty quickly.  States he went to the nutritionist several years ago but he is interested in getting help with his diet again.  Denies fever, chills, dizziness, vision changes, chest pain, palpitations, shortness of breath, abdominal pain, nausea, diarrhea, urinary symptoms, lower extremity edema. Denies polyuria.   Reviewed allergies, medications, past medical, surgical, family, and social history.    Review of Systems Pertinent positives and negatives in the history of present illness.     Objective:   Physical Exam BP 120/80   Pulse 82   Wt 182 lb 12.8 oz (82.9 kg)   BMI 26.99 kg/m   Alert and oriented and in no distress. Pharyngeal area is normal. Neck is supple without adenopathy or thyromegaly. Cardiac exam shows a regular sinus  rhythm without murmurs or gallops. Lungs are clear to auscultation.  Extremities without edema, distal pulses intact.  Foot exam shows bilateral pulses, capillary refill, sensation and vibratory normal.  Skin intact.  Monofilament testing done      Assessment & Plan:  Controlled type 2 diabetes mellitus without complication, without long-term current use of insulin (HCC) - Plan: Glucose (CBG), Fasting  Hypertension associated with diabetes ([place])  Type 2 diabetes mellitus with hyperglycemia, without long-term current use of insulin (HCC) - Plan: VAS [place] ABI WITH/WO TBI, Amb ref to Medical Nutrition Therapy-MNT, VAS [place] LOWER EXTREMITY ARTERIAL DUPLEX  Urinary frequency  Burning sensation of foot - Plan: VAS [place] ABI WITH/WO TBI, VAS [place] LOWER EXTREMITY ARTERIAL DUPLEX  PCOT glucose fasting 164 Reports good medication compliance.  Discussed holding on a new medication and having him make healthy lifestyle changes. he prefers to add medication as well as attempt to change his diet.  He will continue on twice daily metformin, Jardiance and we will add once weekly Bydureon.  Instructions provided on how to administer the injection per Gabriel Cirri, CMA.  Denies having any previous ABI testing.  We will schedule this.  MNT referral made. In-depth counseling on making healthy dietary changes and increasing his physical activity.  Encouraged him to cut back on portion sizes, sugary foods and carbohydrates.  Encouraged him to include more protein into his diet. Discussed medication for  neuropathy he declines this at this time.  He may try over-the-counter capsaicin if he chooses. Unable to leave a urine today.  Follow up in 4 weeks.

## 2018-02-15 NOTE — Patient Instructions (Signed)
Continue on your current medications and add the once weekly Bydureon injection.   Keep an eye on your blood sugars. Goal fasting blood sugars are 80-130.   Cut back on carbohydrates.   The medical nutritionist will call you to schedule an appointment.   Ok to try Capcaisian cream on your feet.   Return in 4 weeks.

## 2018-02-17 ENCOUNTER — Encounter: Payer: Self-pay | Admitting: Internal Medicine

## 2018-02-18 ENCOUNTER — Ambulatory Visit (HOSPITAL_COMMUNITY)
Admission: RE | Admit: 2018-02-18 | Discharge: 2018-02-18 | Disposition: A | Discharge status: Home / Self Care | Payer: No Typology Code available for payment source | Source: Ambulatory Visit | Attending: Cardiology | Admitting: Cardiology

## 2018-02-18 DIAGNOSIS — R208 Other disturbances of skin sensation: Secondary | ICD-10-CM | POA: Insufficient documentation

## 2018-02-18 DIAGNOSIS — E1165 Type 2 diabetes mellitus with hyperglycemia: Secondary | ICD-10-CM | POA: Diagnosis present

## 2018-02-19 ENCOUNTER — Other Ambulatory Visit: Payer: Self-pay | Admitting: Family Medicine

## 2018-03-04 ENCOUNTER — Other Ambulatory Visit: Payer: Self-pay | Admitting: Family Medicine

## 2018-03-16 ENCOUNTER — Ambulatory Visit (INDEPENDENT_AMBULATORY_CARE_PROVIDER_SITE_OTHER): Payer: No Typology Code available for payment source | Admitting: Family Medicine

## 2018-03-16 ENCOUNTER — Encounter: Payer: Self-pay | Admitting: Family Medicine

## 2018-03-16 VITALS — BP 112/70 | HR 66 | Temp 98.7°F | Resp 16 | Ht 69.0 in | Wt 182.2 lb

## 2018-03-16 DIAGNOSIS — E119 Type 2 diabetes mellitus without complications: Secondary | ICD-10-CM | POA: Diagnosis not present

## 2018-03-16 NOTE — Progress Notes (Signed)
   Subjective:    Patient ID: Jacob Hart, male    DOB: 31-Mar-1966, 52 y.o.   MRN: [id number]  HPI Chief Complaint  Patient presents with  . follow up    4 week follow up DM sugar running high    He is here to follow up on hyperglycemia and starting on new medication GLP-1 BCISE. He is also taking metformin 1,000 mg bid and Jardiance 10 mg daily in the morning.   FBS are still in the 160s.  States he ate lunch and 2 hours after his meal his BS was 124.   He will be seeing a dietician next week. States he eats too much in the evenings.   States burning sensation to his feet has improved.   Denies fever, chills, dizziness, chest pain, DOE, polyuria, polydipsia.    Review of Systems Pertinent positives and negatives in the history of present illness.     Objective:   Physical Exam BP 112/70   Pulse 66   Temp 98.7 F (37.1 C) (Oral)   Resp 16   Ht 5\' 9"  (1.753 m)   Wt 182 lb 3.2 oz (82.6 kg)   SpO2 95%   BMI 26.91 kg/m   Alert and oriented and in no acute distress. Not otherwise examined.       Assessment & Plan:  Controlled type 2 diabetes mellitus without complication, without long-term current use of insulin ([place])  Doing well and pleased with current medication regimen. No hypoglycemia. FBS still not in goal range. Previous Hgb A1c 7.5% on 12/27/2017. Continue on medications and see the dietician next week as scheduled.  Follow up in January as scheduled.

## 2018-03-22 ENCOUNTER — Ambulatory Visit: Payer: No Typology Code available for payment source | Admitting: *Deleted

## 2018-04-02 ENCOUNTER — Telehealth: Payer: Self-pay | Admitting: Family Medicine

## 2018-04-02 NOTE — Telephone Encounter (Signed)
P.A. JARDIANCE 

## 2018-04-16 NOTE — Telephone Encounter (Signed)
P.A. approved til 04/03/19, pt informed, faxed pharmacy

## 2018-04-29 ENCOUNTER — Ambulatory Visit: Payer: No Typology Code available for payment source | Admitting: Family Medicine

## 2018-05-10 ENCOUNTER — Other Ambulatory Visit: Payer: Self-pay | Admitting: Family Medicine

## 2018-05-10 DIAGNOSIS — E119 Type 2 diabetes mellitus without complications: Secondary | ICD-10-CM

## 2018-05-10 DIAGNOSIS — E1165 Type 2 diabetes mellitus with hyperglycemia: Secondary | ICD-10-CM

## 2018-06-03 ENCOUNTER — Ambulatory Visit (INDEPENDENT_AMBULATORY_CARE_PROVIDER_SITE_OTHER): Payer: No Typology Code available for payment source | Admitting: Ophthalmology

## 2018-06-03 ENCOUNTER — Encounter (INDEPENDENT_AMBULATORY_CARE_PROVIDER_SITE_OTHER): Payer: Self-pay | Admitting: Ophthalmology

## 2018-06-03 DIAGNOSIS — E113293 Type 2 diabetes mellitus with mild nonproliferative diabetic retinopathy without macular edema, bilateral: Secondary | ICD-10-CM | POA: Diagnosis not present

## 2018-06-03 DIAGNOSIS — I10 Essential (primary) hypertension: Secondary | ICD-10-CM | POA: Diagnosis not present

## 2018-06-03 DIAGNOSIS — H35033 Hypertensive retinopathy, bilateral: Secondary | ICD-10-CM | POA: Diagnosis not present

## 2018-06-03 DIAGNOSIS — H3581 Retinal edema: Secondary | ICD-10-CM | POA: Diagnosis not present

## 2018-06-03 DIAGNOSIS — H25813 Combined forms of age-related cataract, bilateral: Secondary | ICD-10-CM

## 2018-06-03 NOTE — Progress Notes (Signed)
[facility] Note  06/03/2018     CHIEF COMPLAINT Patient presents for Retina Follow Up   HISTORY OF PRESENT ILLNESS: Jacob Hart is a 53 y.o. male who presents to the clinic today for:   HPI    Retina Follow Up    Patient presents with  Diabetic Retinopathy.  In both eyes.  Severity is mild.  Duration of 6 months.  I, the attending physician,  performed the HPI with the patient and updated documentation appropriately.          Comments    Patient states vision the same OU. Last A1c was around 7. BS was 105 this am.        Last edited by Bernarda Caffey, MD on 06/03/2018  2:30 PM. (History)    pt states he is having no trouble with his vision, he states his blood sugar is under control, he says his last A1C was 7.5, which is where he seems to be holding  Referring physician: Girtha Rm, NP-C [place], [place] [address]  HISTORICAL INFORMATION:   Selected notes from the [facility] [place] Referred by [name], NP-C for DM exam LEE:  Ocular Hx- PMH-DM (last A1C: 7.0, taking jardiance, metformin) HTN, anxiety, depression    CURRENT MEDICATIONS: No current outpatient medications on file. (Ophthalmic Drugs)   No current facility-administered medications for this visit.  (Ophthalmic Drugs)   Current Outpatient Medications (Other)  Medication Sig  . atorvastatin (LIPITOR) 10 MG tablet TAKE 1 TABLET BY MOUTH EVERY DAY  . enalapril (VASOTEC) 5 MG tablet TAKE ONE TABLET BY MOUTH ONCE DAILY  . escitalopram (LEXAPRO) 20 MG tablet TAKE 1 TABLET BY MOUTH EVERY DAY  . Exenatide ER 2 MG/0.85ML AUIJ INJECT 1 AUTO-INJECTOR INTO THE SKIN ONCE A WEEK  . glucose blood test strip Test once a day. Pt uses one touch verio flex meter  . JARDIANCE 10 MG TABS tablet TAKE 1 TABLET BY MOUTH EVERY DAY  . metFORMIN (GLUCOPHAGE) 1000 MG tablet TAKE 1 TABLET BY MOUTH TWICE A DAY WITH A MEAL  . omeprazole (PRILOSEC) 20 MG capsule TAKE  ONE CAPSULE BY MOUTH ONCE DAILY  . ONETOUCH DELICA LANCETS FINE MISC Test once a day   No current facility-administered medications for this visit.  (Other)      REVIEW OF SYSTEMS: ROS    Positive for: Endocrine, Eyes   Negative for: Constitutional, Gastrointestinal, Neurological, Skin, Genitourinary, Musculoskeletal, HENT, Cardiovascular, Respiratory, Psychiatric, Allergic/Imm, Heme/Lymph   Last edited by Roselee Nova D on 06/03/2018  1:25 PM. (History)       ALLERGIES Allergies  Allergen Reactions  . Sulfa Antibiotics Hives    PAST MEDICAL HISTORY Past Medical History:  Diagnosis Date  . Anxiety and depression 12/23/2016  . Controlled type 2 diabetes mellitus without complication, without long-term current use of insulin ([place]) 12/23/2016  . GERD (gastroesophageal reflux disease) 12/23/2016  . History of esophageal stricture    dilation done in 2016 at [facility]  . Hypertension associated with diabetes ([place]) 12/23/2016  . Uncontrolled diabetes mellitus type 2 without complications ([place]) 9/48/5462   Past Surgical History:  Procedure Laterality Date  . ABDOMINAL SURGERY     as a child  . ESOPHAGOGASTRODUODENOSCOPY  08/02/2014   mild chronic nonspecific gastritis, stomach and endoscopic biopises. reflux esophagitis  . ESOPHAGOGASTRODUODENOSCOPY (EGD) WITH ESOPHAGEAL DILATION     mild chronic stomach gastritis per record in 2016  . NASAL SEPTUM  SURGERY    . TONSILLECTOMY    . VASECTOMY      FAMILY HISTORY Family History  Problem Relation Age of Onset  . Lung cancer Mother 58  . Lung cancer Maternal Grandfather   . Colon cancer Paternal Grandmother   . CAD Brother     SOCIAL HISTORY Social History   Tobacco Use  . Smoking status: Never Smoker  . Smokeless tobacco: Current User    Types: Snuff  Substance Use Topics  . 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 Drug use: No         OPHTHALMIC EXAM:  Base Eye Exam    Visual  Acuity (Snellen - Linear)      Right Left   Dist [place] 20/20 20/20 -1       Tonometry (Tonopen, 1:30 PM)      Right Left   Pressure 14 18       Pupils      Dark Light Shape React APD   Right 5 4 Round Brisk None   Left 5 4 Round Brisk None       Visual Fields (Counting fingers)      Left Right    Full Full       Extraocular Movement      Right Left    Full, Ortho Full, Ortho       Neuro/Psych    Oriented x3:  Yes   Mood/Affect:  Normal       Dilation    Both eyes:  1.0% Mydriacyl, 2.5% Phenylephrine @ 1:30 PM        Slit Lamp and Fundus Exam    Slit Lamp Exam      Right Left   Lids/Lashes Dermatochalasis - upper lid Dermatochalasis - upper lid   Conjunctiva/Sclera White and quiet White and quiet   Cornea Arcus Arcus   Anterior Chamber Deep and quiet Deep and quiet   Iris Round and reactive Round and reactive   Lens 1+ Nuclear sclerosis, 1+ Cortical cataract 1-2+ Nuclear sclerosis, 1-2+ Cortical cataract   Vitreous Vitreous syneresis Vitreous syneresis       Fundus Exam      Right Left   Disc Tilted disc, Temporal Peripapillary atrophy Tilted disc, Temporal Peripapillary atrophy   C/D Ratio 0.4 0.5   Macula Flat, Good foveal reflex, mild Retinal pigment epithelial mottling, scattered Microaneurysms blunted foveal reflex, scattered Microaneurysms nasal to fovea, cluster of IRH temporal macula, Retinal pigment epithelial mottling, no edema   Vessels Normal Normal   Periphery Attached, scattered MA's Attached, rare, scattered MA/DBH          IMAGING AND PROCEDURES  Imaging and Procedures for @TODAY @  OCT, Retina - OU - Both Eyes       Right Eye Quality was good. Central Foveal Thickness: 320. Progression has been stable. Findings include normal foveal contour, no IRF, no SRF (blunted foveal reflex, single drusen).   Left Eye Quality was good. Central Foveal Thickness: 329. Progression has been stable. Findings include normal foveal contour, no SRF,  intraretinal fluid (Blunted foveal reflex, interval increase in IRF, temporal macula).   Notes *Images captured and stored on drive  Diagnosis / Impression:  NFP, no IRF/SRF OD Interval increase in IRF temporal macula OS  Clinical management:  See below  Abbreviations: NFP - Normal foveal profile. CME - cystoid macular edema. PED - pigment epithelial detachment. IRF - intraretinal fluid. SRF - subretinal fluid. EZ -  ellipsoid zone. ERM - epiretinal membrane. ORA - outer retinal atrophy. ORT - outer retinal tubulation. SRHM - subretinal hyper-reflective material                  ASSESSMENT/PLAN:    ICD-10-CM   1. 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 Essential hypertension I10   3. Hypertensive retinopathy of both eyes H35.033   4. Retinal edema H35.81 OCT, Retina - OU - Both Eyes  5. Combined forms of age-related cataract of both eyes H25.813     1. Mild Non-proliferative diabetic retinopathy w/o DME, OU - The incidence, risk factors for progression, natural history and treatment options for diabetic retinopathy were discussed with patient.   - The need for close monitoring of blood glucose, blood pressure, and serum lipids, avoiding cigarette or any type of tobacco, and the need for long term follow up was also discussed with patient. - exam shows scattered MA OU; no NV - OCT without diabetic macular edema OD, OS with mild noncentral IRF temporal macula - f/u in 6-9 months  2,3. Hypertensive retinopathy OU - discussed importance of tight BP control - monitor  4. No retinal edema on exam or OCT  5. Combined form age-related cataract OU - The symptoms of cataract, surgical options, and treatments and risks were discussed with patient. - discussed diagnosis and progression - not yet visually significant - monitor for now   Ophthalmic Meds Ordered this visit:  No  orders of the defined types were placed in this encounter.      Return for f/u 6-9 months NPDR OU, DFE, OCT.  There are no Patient Instructions on file for this visit.   Explained the diagnoses, plan, and follow up with the patient and they expressed understanding.  Patient expressed understanding of the importance of proper follow up care.   This document serves as a record of services personally performed by Gardiner Sleeper, MD, PhD. It was created on their behalf by Ernest Mallick, OA, an ophthalmic assistant. The creation of this record is the provider's dictation and/or activities during the visit.    Electronically signed by: Ernest Mallick, OA  02.28.2020 1:18 PM    Gardiner Sleeper, M.D., Ph.D. Diseases & Surgery of the Retina and Vitreous [facility]   I have reviewed the above documentation for accuracy and completeness, and I agree with the above. Gardiner Sleeper, M.D., Ph.D. 06/08/18 1:18 PM     Abbreviations: M myopia (nearsighted); A astigmatism; H hyperopia (farsighted); P presbyopia; Mrx spectacle prescription;  CTL contact lenses; OD right eye; OS left eye; OU both eyes  XT exotropia; ET esotropia; PEK punctate epithelial keratitis; PEE punctate epithelial erosions; DES dry eye syndrome; MGD meibomian gland dysfunction; ATs artificial tears; PFAT's preservative free artificial tears; [place] nuclear sclerotic cataract; PSC posterior subcapsular cataract; ERM epi-retinal membrane; PVD posterior vitreous detachment; RD retinal detachment; DM diabetes mellitus; DR diabetic retinopathy; NPDR non-proliferative diabetic retinopathy; PDR proliferative diabetic retinopathy; CSME clinically significant macular edema; DME diabetic macular edema; dbh dot blot hemorrhages; CWS cotton wool spot; POAG primary open angle glaucoma; C/D cup-to-disc ratio; HVF humphrey visual field; GVF goldmann visual field; OCT optical coherence tomography; IOP intraocular pressure; BRVO  Branch retinal vein occlusion; CRVO central retinal vein occlusion; CRAO central retinal artery occlusion; BRAO branch retinal artery occlusion; RT retinal tear; SB scleral buckle; PPV pars plana vitrectomy; VH Vitreous  hemorrhage; PRP panretinal laser photocoagulation; IVK intravitreal kenalog; VMT vitreomacular traction; MH Macular hole;  NVD neovascularization of the disc; NVE neovascularization elsewhere; AREDS age related eye disease study; ARMD age related macular degeneration; POAG primary open angle glaucoma; EBMD epithelial/anterior basement membrane dystrophy; ACIOL anterior chamber intraocular lens; IOL intraocular lens; PCIOL posterior chamber intraocular lens; Phaco/IOL phacoemulsification with intraocular lens placement; [place] photorefractive keratectomy; LASIK laser assisted in situ keratomileusis; HTN hypertension; DM diabetes mellitus; COPD chronic obstructive pulmonary disease

## 2018-06-08 ENCOUNTER — Encounter (INDEPENDENT_AMBULATORY_CARE_PROVIDER_SITE_OTHER): Payer: Self-pay | Admitting: Ophthalmology

## 2018-07-01 ENCOUNTER — Other Ambulatory Visit: Payer: Self-pay | Admitting: Family Medicine

## 2018-07-01 NOTE — Telephone Encounter (Signed)
Is this okay to refill? 

## 2018-07-04 ENCOUNTER — Other Ambulatory Visit: Payer: Self-pay | Admitting: Family Medicine

## 2018-07-10 ENCOUNTER — Other Ambulatory Visit: Payer: Self-pay | Admitting: Family Medicine

## 2018-07-28 ENCOUNTER — Encounter: Payer: Self-pay | Admitting: Family Medicine

## 2018-08-08 ENCOUNTER — Other Ambulatory Visit: Payer: Self-pay | Admitting: Family Medicine

## 2018-08-08 DIAGNOSIS — E1165 Type 2 diabetes mellitus with hyperglycemia: Secondary | ICD-10-CM

## 2018-08-08 DIAGNOSIS — E119 Type 2 diabetes mellitus without complications: Secondary | ICD-10-CM

## 2018-09-02 ENCOUNTER — Other Ambulatory Visit: Payer: Self-pay | Admitting: Family Medicine

## 2018-09-10 ENCOUNTER — Other Ambulatory Visit: Payer: Self-pay | Admitting: Family Medicine

## 2018-09-30 ENCOUNTER — Other Ambulatory Visit: Payer: Self-pay | Admitting: Family Medicine

## 2018-12-07 ENCOUNTER — Other Ambulatory Visit: Payer: Self-pay | Admitting: Family Medicine

## 2018-12-07 NOTE — Telephone Encounter (Signed)
Pt has an appt end of september

## 2019-01-02 ENCOUNTER — Encounter: Payer: Self-pay | Admitting: Family Medicine

## 2019-01-02 ENCOUNTER — Other Ambulatory Visit: Payer: Self-pay | Admitting: Family Medicine

## 2019-01-02 ENCOUNTER — Other Ambulatory Visit: Payer: Self-pay

## 2019-01-02 ENCOUNTER — Ambulatory Visit (INDEPENDENT_AMBULATORY_CARE_PROVIDER_SITE_OTHER): Payer: No Typology Code available for payment source | Admitting: Family Medicine

## 2019-01-02 VITALS — BP 130/80 | HR 74 | Temp 97.5°F | Ht 70.0 in | Wt 182.8 lb

## 2019-01-02 DIAGNOSIS — I10 Essential (primary) hypertension: Secondary | ICD-10-CM

## 2019-01-02 DIAGNOSIS — G4730 Sleep apnea, unspecified: Secondary | ICD-10-CM | POA: Insufficient documentation

## 2019-01-02 DIAGNOSIS — Z0000 Encounter for general adult medical examination without abnormal findings: Secondary | ICD-10-CM

## 2019-01-02 DIAGNOSIS — Z23 Encounter for immunization: Secondary | ICD-10-CM | POA: Diagnosis not present

## 2019-01-02 DIAGNOSIS — I152 Hypertension secondary to endocrine disorders: Secondary | ICD-10-CM

## 2019-01-02 DIAGNOSIS — F419 Anxiety disorder, unspecified: Secondary | ICD-10-CM

## 2019-01-02 DIAGNOSIS — Z125 Encounter for screening for malignant neoplasm of prostate: Secondary | ICD-10-CM | POA: Diagnosis not present

## 2019-01-02 DIAGNOSIS — E1159 Type 2 diabetes mellitus with other circulatory complications: Secondary | ICD-10-CM

## 2019-01-02 DIAGNOSIS — E119 Type 2 diabetes mellitus without complications: Secondary | ICD-10-CM | POA: Diagnosis not present

## 2019-01-02 DIAGNOSIS — Z114 Encounter for screening for human immunodeficiency virus [HIV]: Secondary | ICD-10-CM | POA: Diagnosis not present

## 2019-01-02 DIAGNOSIS — F32A Depression, unspecified: Secondary | ICD-10-CM

## 2019-01-02 DIAGNOSIS — E133299 Other specified diabetes mellitus with mild nonproliferative diabetic retinopathy without macular edema, unspecified eye: Secondary | ICD-10-CM | POA: Insufficient documentation

## 2019-01-02 DIAGNOSIS — F329 Major depressive disorder, single episode, unspecified: Secondary | ICD-10-CM

## 2019-01-02 LAB — POCT GLYCOSYLATED HEMOGLOBIN (HGB A1C): Hemoglobin A1C: 7.9 % — AB (ref 4.0–5.6)

## 2019-01-02 MED ORDER — TRULICITY 0.75 MG/0.5ML ~~LOC~~ SOAJ: 0.7500 mg | SUBCUTANEOUS | 3 refills | Status: DC

## 2019-01-02 MED ORDER — ENALAPRIL MALEATE 5 MG PO TABS: 5.0000 mg | ORAL_TABLET | Freq: Every day | ORAL | 1 refills | Status: DC

## 2019-01-02 NOTE — Progress Notes (Signed)
Subjective:    Patient ID: Jacob Hart, male    DOB: February 10, 1966, 53 y.o.   MRN: [id number]  HPI Chief Complaint  Patient presents with  . fasting cpe    fasting cpe, flu shot given today   He is here for a complete physical exam. Previous medical care: Last CPE:  Other providers: Dr. Coralyn Pear- eyes  Dentist- [facility] in [place].    Diabetes- Checks BS fasting daily and readings have been in the 150s-190 Reports taking Metformin twice daily and Jardiance daily.  Stopped Bcise in June 2020. He had issues with the pen and a "knot under his skin". He did not let me know.  Denies numbness, tingling or decreased sensation in his feet.    HTN- has not been taking the enalapril and does not know why. States the prescription just "fell off". Did not let me know. Does not check BP at home.   History of sleep study showing mild sleep apnea, on OSA. Is not using a CPAP. States he is sleeping fine.   Anxiety and depression- reports taking Lexapro same dose for 10 years and doing well. On depression or anxiety issues.   Social history: Lives with wife. He has 2 adult children. works as a Furniture conservator/restorer  Denies smoking, drinking alcohol, drug use He does use smokeless tobacco and no plan to stop.   Diet: eats a lot of spaghetti and carbohydrates.  Exercise: works in garden but nothing beyond this and his active job.   Immunizations: Tdap UTD, Prevnar 13.   Health maintenance:  Colonoscopy: 10/2014 and due for recall in 2021  Last PSA: 12/2017 Last Dental Exam: last month  Last Eye Exam: 05/08/18 and has another appt in December.   Depression screen [facility] 2/9 01/02/2019 12/27/2017 12/23/2016 08/13/2016 08/13/2016  Decreased Interest 0 0 0 0 0  Down, Depressed, Hopeless 0 0 0 0 0  PHQ - 2 Score 0 0 0 0 0     Wears seatbelt always, uses sunscreen, smoke detectors in home and functioning, does not text while driving, feels safe in home environment.  Reviewed allergies,  medications, past medical, surgical, family, and social history.     Review of Systems Review of Systems Constitutional: -fever, -chills, -sweats, -unexpected weight change,-fatigue ENT: -runny nose, -ear pain, -sore throat Cardiology:  -chest pain, -palpitations, -edema Respiratory: -cough, -shortness of breath, -wheezing Gastroenterology: -abdominal pain, -nausea, -vomiting, -diarrhea, -constipation  Hematology: -bleeding or bruising problems Musculoskeletal: -arthralgias, -myalgias, -joint swelling, -back pain Ophthalmology: -vision changes Urology: -dysuria, -difficulty urinating, -hematuria, -urinary frequency, -urgency, -ED Neurology: -headache, -weakness, -tingling, -numbness       Objective:   Physical Exam BP 130/80   Pulse 74   Temp (!) 97.5 F (36.4 C)   Ht 5\' 10"  (1.778 m)   Wt 182 lb 12.8 oz (82.9 kg)   BMI 26.23 kg/m   General Appearance:    Alert, cooperative, no distress, appears stated age  Head:    Normocephalic, without obvious abnormality, atraumatic  Eyes:    PERRL, conjunctiva/corneas clear, EOM's intact, fundi    benign  Ears:    Normal TM's and external ear canals  Nose:   Mask in place   Throat:   Mask in place   Neck:   Supple, no lymphadenopathy;  thyroid:  no   enlargement/tenderness/nodules; no carotid   bruit or JVD  Back:    Spine nontender, no curvature, ROM normal, no CVA     tenderness  Lungs:  Clear to auscultation bilaterally without wheezes, rales or     ronchi; respirations unlabored  Chest Wall:    No tenderness or deformity   Heart:    Regular rate and rhythm, S1 and S2 normal, no murmur, rub   or gallop  Breast Exam:    No chest wall tenderness, masses or gynecomastia  Abdomen:     Soft, non-tender, nondistended, normoactive bowel sounds,    no masses, no hepatosplenomegaly  Genitalia:    Declines   Rectal:    Declines   Extremities:   No clubbing, cyanosis or edema  Pulses:   2+ and symmetric all extremities  Skin:    Skin color, texture, turgor normal, no rashes or lesions  Lymph nodes:   Cervical, supraclavicular, and axillary nodes normal  Neurologic:   CNII-XII intact, normal strength, sensation and gait; reflexes 2+ and symmetric throughout          Psych:   Normal mood, affect, hygiene and grooming.         Assessment & Plan:  Routine general medical examination at a health care facility - Plan: CBC with Differential/Platelet, Comprehensive metabolic panel, Lipid panel - counseling on preventive health care. Colonoscopy due again in 2021. Will check PSA to look at trend. Immunizations reviewed and up to date. Discussed safety and health promotion.   Controlled type 2 diabetes mellitus without complication, without long-term current use of insulin ([place]) - Plan: HgB A1c, Microalbumin / creatinine urine ratio, Lipid panel - Hgb A1c 7.9%. discussed better control to prevent worsening health.  He stopped Bcise 4 months ago due to having issues with a knot under his skin with injections and difficulty with pen.  Start him on Trulicity and he will let me know if any problems arise.  Continue Metformin and Jardiance.  Counseling on cutting back carbohydrates.  Continue statin. He is doing well with this.  Check labs and follow up in 4 months or sooner if needed.   Hypertension associated with diabetes ([place]) - Plan: enalapril (VASOTEC) 5 MG tablet -unclear as to why he stopped the enalapril. Will restart this and let me know if any issues. Discussed standards of care with DM  Anxiety and depression- doing well on Lexapro. Continue medication.   Mild sleep apnea- no OSA per report. Does not use CPAP, declines. Reports sleeping well.   Mild nonproliferative retinopathy due to secondary diabetes [facility])- has follow up with Dr. Coralyn Pear in December. Discussed good control of DM to prevent worsening  Needs flu shot - Plan: Flu Vaccine QUAD 36+ mos IM- this was given and side effects discussed   Screening for  HIV without presence of risk factors - Plan: HIV Antibody (routine testing w rflx) - done per screening guidelines. He is ok with this.   Screening for prostate cancer - Plan: PSA Follow up pending results. No urinary symptoms

## 2019-01-02 NOTE — Addendum Note (Signed)
Addended by: Minette Headland A on: 01/02/2019 10:42 AM   Modules accepted: Orders

## 2019-01-02 NOTE — Patient Instructions (Addendum)
Your hemoglobin A1c is 7.9% and we should get this down.  Try the once weekly Trulicity injection and see how you do with this. Let me know if you have any concerns.   Start back on the enalapril. I sent this to your pharmacy.   Continue on your other medications and let me know if you have any concerns with any of them.   Cut back on carbohydrates and include more salads and vegetables and lean meat.    Preventive Care 62-52 Years Old, Male Preventive care refers to lifestyle choices and visits with your health care provider that can promote health and wellness. This includes:  A yearly physical exam. This is also called an annual well check.  Regular dental and eye exams.  Immunizations.  Screening for certain conditions.  Healthy lifestyle choices, such as eating a healthy diet, getting regular exercise, not using drugs or products that contain nicotine and tobacco, and limiting alcohol use. What can I expect for my preventive care visit? Physical exam Your health care provider will check:  Height and weight. These may be used to calculate body mass index (BMI), which is a measurement that tells if you are at a healthy weight.  Heart rate and blood pressure.  Your skin for abnormal spots. Counseling Your health care provider may ask you questions about:  Alcohol, tobacco, and drug use.  Emotional well-being.  Home and relationship well-being.  Sexual activity.  Eating habits.  Work and work Statistician. What immunizations do I need?  Influenza (flu) vaccine  This is recommended every year. Tetanus, diphtheria, and pertussis (Tdap) vaccine  You may need a Td booster every 10 years. Varicella (chickenpox) vaccine  You may need this vaccine if you have not already been vaccinated. Zoster (shingles) vaccine  You may need this after age 52. Measles, mumps, and rubella (MMR) vaccine  You may need at least one dose of MMR if you were born in 1957 or later.  You may also need a second dose. Pneumococcal conjugate (PCV13) vaccine  You may need this if you have certain conditions and were not previously vaccinated. Pneumococcal polysaccharide (PPSV23) vaccine  You may need one or two doses if you smoke cigarettes or if you have certain conditions. Meningococcal conjugate (MenACWY) vaccine  You may need this if you have certain conditions. Hepatitis A vaccine  You may need this if you have certain conditions or if you travel or work in places where you may be exposed to hepatitis A. Hepatitis B vaccine  You may need this if you have certain conditions or if you travel or work in places where you may be exposed to hepatitis B. Haemophilus influenzae type b (Hib) vaccine  You may need this if you have certain risk factors. Human papillomavirus (HPV) vaccine  If recommended by your health care provider, you may need three doses over 6 months. You may receive vaccines as individual doses or as more than one vaccine together in one shot (combination vaccines). Talk with your health care provider about the risks and benefits of combination vaccines. What tests do I need? Blood tests  Lipid and cholesterol levels. These may be checked every 5 years, or more frequently if you are over 25 years old.  Hepatitis C test.  Hepatitis B test. Screening  Lung cancer screening. You may have this screening every year starting at age 31 if you have a 30-pack-year history of smoking and currently smoke or have quit within the past 15 years.  Prostate cancer screening. Recommendations will vary depending on your family history and other risks.  Colorectal cancer screening. All adults should have this screening starting at age 70 and continuing until age 49. Your health care provider may recommend screening at age 34 if you are at increased risk. You will have tests every 1-10 years, depending on your results and the type of screening test.  Diabetes  screening. This is done by checking your blood sugar (glucose) after you have not eaten for a while (fasting). You may have this done every 1-3 years.  Sexually transmitted disease (STD) testing. Follow these instructions at home: Eating and drinking  Eat a diet that includes fresh fruits and vegetables, whole grains, lean protein, and low-fat dairy products.  Take vitamin and mineral supplements as recommended by your health care provider.  Do not drink alcohol if your health care provider tells you not to drink.  If you drink alcohol: ? Limit how much you have to 0-2 drinks a day. ? Be aware of how much alcohol is in your drink. In the [place] one drink equals one 12 oz bottle of beer (355 mL), one 5 oz glass of wine (148 mL), or one 1 oz glass of hard liquor (44 mL). Lifestyle  Take daily care of your teeth and gums.  Stay active. Exercise for at least 30 minutes on 5 or more days each week.  Do not use any products that contain nicotine or tobacco, such as cigarettes, e-cigarettes, and chewing tobacco. If you need help quitting, ask your health care provider.  If you are sexually active, practice safe sex. Use a condom or other form of protection to prevent STIs (sexually transmitted infections).  Talk with your health care provider about taking a low-dose aspirin every day starting at age 13. What's next?  Go to your health care provider once a year for a well check visit.  Ask your health care provider how often you should have your eyes and teeth checked.  Stay up to date on all vaccines. This information is not intended to replace advice given to you by your health care provider. Make sure you discuss any questions you have with your health care provider. Document Released: 04/19/2015 Document Revised: 03/17/2018 Document Reviewed: 03/17/2018 Elsevier Patient Education  2020 [employer].

## 2019-01-02 NOTE — Telephone Encounter (Signed)
Pt being seen today

## 2019-01-03 ENCOUNTER — Telehealth: Payer: Self-pay | Admitting: Internal Medicine

## 2019-01-03 LAB — COMPREHENSIVE METABOLIC PANEL
ALT: 30 IU/L (ref 0–44)
AST: 25 IU/L (ref 0–40)
Albumin/Globulin Ratio: 1.9 (ref 1.2–2.2)
Albumin: 4.5 g/dL (ref 3.8–4.9)
Alkaline Phosphatase: 86 IU/L (ref 39–117)
BUN/Creatinine Ratio: 16 (ref 9–20)
BUN: 15 mg/dL (ref 6–24)
Bilirubin Total: 0.8 mg/dL (ref 0.0–1.2)
CO2: 21 mmol/L (ref 20–29)
Calcium: 9.6 mg/dL (ref 8.7–10.2)
Chloride: 100 mmol/L (ref 96–106)
Creatinine, Ser: 0.92 mg/dL (ref 0.76–1.27)
GFR calc Af Amer: 110 mL/min/{1.73_m2} (ref >59)
GFR calc non Af Amer: 95 mL/min/{1.73_m2} (ref >59)
Globulin, Total: 2.4 g/dL (ref 1.5–4.5)
Glucose: 173 mg/dL — ABNORMAL HIGH (ref 65–99)
Potassium: 4.5 mmol/L (ref 3.5–5.2)
Sodium: 138 mmol/L (ref 134–144)
Total Protein: 6.9 g/dL (ref 6.0–8.5)

## 2019-01-03 LAB — CBC WITH DIFFERENTIAL/PLATELET
Basophils Absolute: 0 10*3/uL (ref 0.0–0.2)
Basos: 1 %
EOS (ABSOLUTE): 0.1 10*3/uL (ref 0.0–0.4)
Eos: 1 %
Hematocrit: 49.5 % (ref 37.5–51.0)
Hemoglobin: 17.3 g/dL (ref 13.0–17.7)
Immature Grans (Abs): 0 10*3/uL (ref 0.0–0.1)
Immature Granulocytes: 0 %
Lymphocytes Absolute: 1.5 10*3/uL (ref 0.7–3.1)
Lymphs: 22 %
MCH: 31.9 pg (ref 26.6–33.0)
MCHC: 34.9 g/dL (ref 31.5–35.7)
MCV: 91 fL (ref 79–97)
Monocytes Absolute: 0.6 10*3/uL (ref 0.1–0.9)
Monocytes: 9 %
Neutrophils Absolute: 4.5 10*3/uL (ref 1.4–7.0)
Neutrophils: 67 %
Platelets: 229 10*3/uL (ref 150–450)
RBC: 5.42 x10E6/uL (ref 4.14–5.80)
RDW: 12.5 % (ref 11.6–15.4)
WBC: 6.6 10*3/uL (ref 3.4–10.8)

## 2019-01-03 LAB — MICROALBUMIN / CREATININE URINE RATIO
Creatinine, Urine: 34.3 mg/dL
Microalb/Creat Ratio: <9 mg/g creat (ref 0–29)
Microalbumin, Urine: <3 ug/mL

## 2019-01-03 LAB — HIV ANTIBODY (ROUTINE TESTING W REFLEX): HIV Screen 4th Generation wRfx: NONREACTIVE

## 2019-01-03 LAB — LIPID PANEL
Chol/HDL Ratio: 4.1 ratio (ref 0.0–5.0)
Cholesterol, Total: 130 mg/dL (ref 100–199)
HDL: 32 mg/dL — ABNORMAL LOW (ref >39)
LDL Chol Calc (NIH): 75 mg/dL (ref 0–99)
Triglycerides: 126 mg/dL (ref 0–149)
VLDL Cholesterol Cal: 23 mg/dL (ref 5–40)

## 2019-01-03 LAB — PSA: Prostate Specific Ag, Serum: 1.9 ng/mL (ref 0.0–4.0)

## 2019-01-03 NOTE — Telephone Encounter (Signed)
Pt called and states he does want to go on a medication to help with better blood flow to his penis as he talked to his wife last night about what you and him discussed in office yesterday and wants something. Please advise

## 2019-01-03 NOTE — Telephone Encounter (Signed)
We can do a virtual visit to discuss this and treatment options. I need proper documentation of the issues he is having and he did not report any concerns with this yesterday.

## 2019-01-04 NOTE — Telephone Encounter (Signed)
Left message for pt to call back to schedule a virtual

## 2019-01-06 ENCOUNTER — Other Ambulatory Visit: Payer: Self-pay

## 2019-01-06 ENCOUNTER — Encounter: Payer: Self-pay | Admitting: Family Medicine

## 2019-01-06 ENCOUNTER — Ambulatory Visit (INDEPENDENT_AMBULATORY_CARE_PROVIDER_SITE_OTHER): Payer: No Typology Code available for payment source | Admitting: Family Medicine

## 2019-01-06 DIAGNOSIS — E1159 Type 2 diabetes mellitus with other circulatory complications: Secondary | ICD-10-CM

## 2019-01-06 DIAGNOSIS — I10 Essential (primary) hypertension: Secondary | ICD-10-CM | POA: Diagnosis not present

## 2019-01-06 DIAGNOSIS — E119 Type 2 diabetes mellitus without complications: Secondary | ICD-10-CM | POA: Diagnosis not present

## 2019-01-06 DIAGNOSIS — I152 Hypertension secondary to endocrine disorders: Secondary | ICD-10-CM

## 2019-01-06 DIAGNOSIS — N529 Male erectile dysfunction, unspecified: Secondary | ICD-10-CM | POA: Diagnosis not present

## 2019-01-06 MED ORDER — SILDENAFIL CITRATE 20 MG PO TABS: ORAL_TABLET | ORAL | 0 refills | Status: DC

## 2019-01-06 NOTE — Progress Notes (Signed)
   Subjective:  Documentation for virtual audio and video telecommunications through [facility] encounter:  The patient was located at home. 2 patient identifiers used.  The provider was located in the office. The patient did consent to this visit and is aware of possible charges through their insurance for this visit.  The other persons participating in this telemedicine service were none.    Patient ID: Jacob Hart, male    DOB: 11-Nov-1965, 53 y.o.   MRN: [id number]  HPI Chief Complaint  Patient presents with  . treatment options    treatment options for better blood flow for intercourse   At his CPE he denied any issues with ED however, after talking with his wife, he has concerns and requests to try medication to help with ED.    States he has difficulty maintaining an erection. He is able to get an erection without much difficulty but it only lasts a short while. States this has caused him and his wife to become frustrated with sexual activity and their sex life over the past few months has been nonexistent.   No other complaints today.   Denies fever, chills, dizziness, chest pain, palpitations, shortness of breath, abdominal pain, N/V/D, urinary symptoms, LE edema.   Reviewed allergies, medications, past medical, surgical, family, and social history.  Review of Systems Pertinent positives and negatives in the history of present illness.     Objective:   Physical Exam There were no vitals taken for this visit.  Alert and oriented and in no acute distress. Normal speech, mood and thought process.       Assessment & Plan:  Erectile dysfunction, unspecified erectile dysfunction type  Hypertension associated with diabetes ([place])  Controlled type 2 diabetes mellitus without complication, without long-term current use of insulin ([facility])  No red flag symptoms. Advised that keeping his BP and BP under good control are key.  Discussed diagnosis, treatment and potential  side effects of medication.  He will call back after downloading Good Rx and finding the pharmacy in his area that he would like to use and I will send in sildenafil for him. Advised against using nitrates with this medication and if he has an erection longer than 4 hours he will seek immediate medical attention.   Time spent on call was 14 minutes and in review of previous records 2 minutes total.  This virtual service is not related to other E/M service within previous 7 days.

## 2019-01-08 ENCOUNTER — Other Ambulatory Visit: Payer: Self-pay | Admitting: Family Medicine

## 2019-01-11 ENCOUNTER — Other Ambulatory Visit: Payer: Self-pay | Admitting: Family Medicine

## 2019-01-25 ENCOUNTER — Other Ambulatory Visit: Payer: Self-pay | Admitting: Family Medicine

## 2019-02-28 ENCOUNTER — Other Ambulatory Visit: Payer: Self-pay | Admitting: Family Medicine

## 2019-03-10 ENCOUNTER — Encounter (INDEPENDENT_AMBULATORY_CARE_PROVIDER_SITE_OTHER): Payer: No Typology Code available for payment source | Admitting: Ophthalmology

## 2019-03-28 ENCOUNTER — Other Ambulatory Visit: Payer: Self-pay | Admitting: Family Medicine

## 2019-04-21 ENCOUNTER — Other Ambulatory Visit: Payer: Self-pay | Admitting: Family Medicine

## 2019-04-21 NOTE — Telephone Encounter (Signed)
They have changed from truility to [place]. Would the dosing be 0.6mg  for a week, 1.2mg  for a week and 1.8 mg for a week then 3mg  daily

## 2019-04-21 NOTE — Telephone Encounter (Signed)
Switched to bcise and pt was left message on vm about this

## 2019-04-21 NOTE — Telephone Encounter (Signed)
Can we find out which medication in it's class is covered? Happy to switch him.

## 2019-04-21 NOTE — Telephone Encounter (Signed)
The generic for victoza or Bcise is fine

## 2019-04-21 NOTE — Telephone Encounter (Signed)
Pharmacy suggested [facility]- victoza or bcise

## 2019-04-21 NOTE — Telephone Encounter (Signed)
I recommend Bcise over Victoza due to once weekly dosing.

## 2019-04-21 NOTE — Telephone Encounter (Signed)
Insurance not covering med

## 2019-04-22 ENCOUNTER — Other Ambulatory Visit: Payer: Self-pay | Admitting: Family Medicine

## 2019-05-03 NOTE — Progress Notes (Signed)
Subjective:    Patient ID: Jacob Hart, male    DOB: [birth year], 54 y.o.   MRN: [id number]  Jacob Hart is a 54 y.o. male who presents for follow-up of Type 2 diabetes mellitus and other chronic health conditions.  HTN- good compliance and no issues with medication.   On statin and no side effects.   GERD- taking omeprazole. History of esophageal dilation in [place].  States he is due this year for his colonoscopy. Last one was in [place].   Complains of chronic cough. Mainly dry but he often notices some post nasal drainage. Cough is worse after he eats at night. States he occasionally wakes up and has a bowl of cereal or peanut butter in the middle the night and lays back down. Denies fever, chills, chest pain, palpitations, shortness of breath, orthopnea, PND, lower extremity edema.  ED-states sildenafil worked great for him.  He is taking Lexapro for his mood and doing well  Patient is checking home blood sugars.   Home blood sugar records: ranging between 130 to 250 How often is blood sugars being checked: once a day Current symptoms include: none. Patient denies increased appetite, nausea, visual disturbances, vomiting and weight loss.  Patient is checking their feet daily. Any Foot concerns (callous, ulcer, wound, thickened nails, toenail fungus, skin fungus, hammer toe): none Last dilated eye exam: was suppose to have one back in December 2020 but cancelled it and will have to reschedule  Current treatments: doing well on dm meds. Medication compliance: good  Current diet: in general, a "healthy" diet   Current exercise: none Known diabetic complications: none  The following portions of the patient's history were reviewed and updated as appropriate: allergies, current medications, past medical history, past social history and problem list.  ROS as in subjective above.     Objective:    Physical Exam Alert and in no distress. Cardiac exam shows a  regular rhythm without murmurs or gallops. Lungs are clear to auscultation. Extremities without edema. Skin is warm and dry.    Blood pressure 110/60, pulse 73, temperature 98.2 F (36.8 C), weight 181 lb 9.6 oz (82.4 kg).  Lab Review Diabetic Labs Latest Ref Rng & Units 05/04/2019 01/02/2019 12/27/2017 08/04/2017 04/02/2017  HbA1c 4.0 - 5.6 % 6.1(A) 7.9(A) 7.5(A) 7.0% 6.9  Microalbumin Not estab mg/dL - - - - -  Micro/Creat Ratio 0 - 29 mg/g creat - <9 <7.6 - -  Chol 100 - 199 mg/dL - 130 122 - 116  HDL >39 mg/dL - 32(L) 34(L) - 31(L)  Calc LDL 0 - 99 mg/dL - 75 71 - 71  Triglycerides 0 - 149 mg/dL - 126 85 - 65  Creatinine 0.76 - 1.27 mg/dL - 0.92 0.83 - 0.70   BP/Weight 05/04/2019 01/02/2019 03/16/2018 02/15/2018 [id number]  Systolic BP [id number]  Diastolic BP 60 80 70 80 70  Wt. (Lbs) 181.6 182.8 182.2 182.8 183  BMI 26.06 26.23 26.91 26.99 27.02   Foot/eye exam completion dates 01/02/2019 12/23/2016  Foot Form Completion Done Done    Jacob Hart  reports that he has never smoked. His smokeless tobacco use includes snuff. He reports that he does not drink alcohol or use drugs.     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 Rx changes: none Hgb A1c improved from 7.9% to 6.1%  On Metformin, Jardiance, and Bcise.  2. Education: Reviewed 'ABCs' of diabetes management (respective goals in  parentheses):  A1C (<7), blood pressure (<130/80), and cholesterol (LDL <100). 3. Compliance at present is estimated to be good. Efforts to improve compliance (if necessary) will be directed at increased exercise and regular blood sugar monitoring: daily. 4. HTN- controlled. Continue on enalapril.  5. On statin therapy and doing fine.  6. Chronic cough- appears to be related to reflux vs. Allergies. He may try Claritin or Allegra and I also spent time counseling him on lifestyle modifications to control acid reflux. 7. Anxiety and depression-doing well.  He does report that he still occasionally stress eats but not as often 8. He is overdue for his diabetic eye exam and states he will reschedule once the Covid pandemic calms down. 9. ED-states the medication worked well for him.  He does not need a refill. 10. Reports being due this year for his colonoscopy.  His last one was in [place].  I will refer him to the [facility] for this as well as to address GERD and history of esophageal dilation. 11. Follow up: 4 months for diabetes, chronic health conditions and fasting labs.

## 2019-05-04 ENCOUNTER — Encounter: Payer: Self-pay | Admitting: Family Medicine

## 2019-05-04 ENCOUNTER — Other Ambulatory Visit: Payer: Self-pay | Admitting: Family Medicine

## 2019-05-04 ENCOUNTER — Ambulatory Visit: Payer: No Typology Code available for payment source | Admitting: Family Medicine

## 2019-05-04 ENCOUNTER — Other Ambulatory Visit: Payer: Self-pay

## 2019-05-04 VITALS — BP 110/60 | HR 73 | Temp 98.2°F | Wt 181.6 lb

## 2019-05-04 DIAGNOSIS — I10 Essential (primary) hypertension: Secondary | ICD-10-CM

## 2019-05-04 DIAGNOSIS — Z23 Encounter for immunization: Secondary | ICD-10-CM | POA: Diagnosis not present

## 2019-05-04 DIAGNOSIS — E119 Type 2 diabetes mellitus without complications: Secondary | ICD-10-CM | POA: Diagnosis not present

## 2019-05-04 DIAGNOSIS — R05 Cough: Secondary | ICD-10-CM

## 2019-05-04 DIAGNOSIS — E1159 Type 2 diabetes mellitus with other circulatory complications: Secondary | ICD-10-CM | POA: Diagnosis not present

## 2019-05-04 DIAGNOSIS — Z1211 Encounter for screening for malignant neoplasm of colon: Secondary | ICD-10-CM

## 2019-05-04 DIAGNOSIS — F32A Depression, unspecified: Secondary | ICD-10-CM

## 2019-05-04 DIAGNOSIS — Z9889 Other specified postprocedural states: Secondary | ICD-10-CM | POA: Insufficient documentation

## 2019-05-04 DIAGNOSIS — E133299 Other specified diabetes mellitus with mild nonproliferative diabetic retinopathy without macular edema, unspecified eye: Secondary | ICD-10-CM

## 2019-05-04 DIAGNOSIS — F419 Anxiety disorder, unspecified: Secondary | ICD-10-CM

## 2019-05-04 DIAGNOSIS — F329 Major depressive disorder, single episode, unspecified: Secondary | ICD-10-CM

## 2019-05-04 DIAGNOSIS — N529 Male erectile dysfunction, unspecified: Secondary | ICD-10-CM

## 2019-05-04 DIAGNOSIS — R053 Chronic cough: Secondary | ICD-10-CM | POA: Insufficient documentation

## 2019-05-04 DIAGNOSIS — K219 Gastro-esophageal reflux disease without esophagitis: Secondary | ICD-10-CM

## 2019-05-04 DIAGNOSIS — I152 Hypertension secondary to endocrine disorders: Secondary | ICD-10-CM

## 2019-05-04 LAB — POCT GLYCOSYLATED HEMOGLOBIN (HGB A1C): Hemoglobin A1C: 6.1 % — AB (ref 4.0–5.6)

## 2019-05-04 NOTE — Patient Instructions (Signed)
Your hemoglobin A1c is 6.1% today and your blood sugars are much improved.  Continue on your current medications but I recommend that he keep a close eye on your blood sugars.  If you are seeing fasting blood sugars less than 100, please let me know.  We may be able to reduce some of your diabetes medications.  For your cough, I recommend trying an over-the-counter nondrowsy antihistamine such as Claritin, Allegra, Zyrtec or Xyzal.  Your cough may also be related to your acid reflux.  Recommend avoiding eating and laying down for at least 2 hours.  Also recommend avoiding any foods or beverages that trigger your cough.  Eat small meals and do not overload your stomach.  You will receive a call from [facility] regarding screening colonoscopy.  You may also want to discuss your history of acid reflux and esophageal dilation.  I will see you back in 4 months or sooner if needed

## 2019-05-04 NOTE — Telephone Encounter (Signed)
Has an appt today. Will refill this at appt

## 2019-05-05 ENCOUNTER — Encounter: Payer: Self-pay | Admitting: Gastroenterology

## 2019-05-26 ENCOUNTER — Other Ambulatory Visit: Payer: Self-pay | Admitting: Family Medicine

## 2019-06-02 ENCOUNTER — Encounter: Payer: Self-pay | Admitting: Gastroenterology

## 2019-06-02 ENCOUNTER — Ambulatory Visit: Payer: No Typology Code available for payment source | Admitting: Gastroenterology

## 2019-06-02 VITALS — BP 142/74 | HR 79 | Temp 98.2°F | Ht 69.0 in | Wt 178.0 lb

## 2019-06-02 DIAGNOSIS — R1310 Dysphagia, unspecified: Secondary | ICD-10-CM

## 2019-06-02 DIAGNOSIS — Z01818 Encounter for other preprocedural examination: Secondary | ICD-10-CM

## 2019-06-02 DIAGNOSIS — K219 Gastro-esophageal reflux disease without esophagitis: Secondary | ICD-10-CM

## 2019-06-02 DIAGNOSIS — Z800 Family history of malignant neoplasm of digestive organs: Secondary | ICD-10-CM | POA: Diagnosis not present

## 2019-06-02 DIAGNOSIS — Z1211 Encounter for screening for malignant neoplasm of colon: Secondary | ICD-10-CM | POA: Diagnosis not present

## 2019-06-02 MED ORDER — OMEPRAZOLE 40 MG PO CPDR: 40.0000 mg | DELAYED_RELEASE_CAPSULE | Freq: Every day | ORAL | 3 refills | Status: DC

## 2019-06-02 MED ORDER — SUTAB 1479-225-188 MG PO TABS: 1.0000 | ORAL_TABLET | ORAL | 0 refills | Status: DC

## 2019-06-02 NOTE — Progress Notes (Signed)
Referring Provider: Girtha Rm, NP-C Primary Care Physician:  Girtha Rm, NP-C  Reason for Consultation: GERD   IMPRESSION:  GERD Intermittent liquid dysphagia Need for colon cancer screening Family history of colon polyps (father at age 53) Family history of colon cancer (paternal grandmother with colon cancer in her 79s)  GERD with intermittent liquid dysphagia: Will obtain prior records regarding diagnosis and dilatation because his current symptoms are atypical for ring, web, or stricture. I recommend increasing the omeprazole and proceeding with a barium esophagram as well as an EGD with esophageal and gastric biopsies. If negative, will proceed with esophageal manometry.   Family history of colon polyps and cancer: Last colonoscopy 5 years ago. Colonoscopy recommended.   PLAN: Increase omeprazole to 40 mg QAM EGD Barium esophagram Colonoscopy Obtain prior records from Dr. Posey Pronto  The nature of the procedure, as well as the risks, benefits, and alternatives were carefully and thoroughly reviewed with the patient. Ample time for discussion and questions allowed. The patient understood, was satisfied, and agreed to proceed.  Please see the "Patient Instructions" section for addition details about the plan.  HPI: Jacob Hart is a 54 y.o. male referred by NP [facility].  The history is obtained to the patient and review of his electronic health record.  He has type 2 diabetes mellitus with retinopathy, hypertension, erectile dysfunction, mood disorder treated with Lexapro and GERD. He is Furniture conservator/restorer.   Previously evaluated by gastroenterologist in [place], [place].  Has longstanding reflux previously controlled on omeprazole 20 mg daily.  Has frequent "strangling" with liquids that started 6 months ago. No difficulty with solids.  Some dysphagia at the sternal notch. No odynophagia. Also has a chronic cough and postnasal drainage. No dysphagia, sore throat, no neck  pain. No evidence for GI bleeding, iron deficiency anemia, anorexia, unexplained weight loss, nausea, persistent vomiting. Has a history of esophageal dilation in 2015. Noted improvement following until the last 6 months.  He takes omeprazole 20 mg QAM. He has never been on a higher dose.   He has had a prior colonoscopy in 2015 and was told to have another one every 5 years due to his family history.  Feels that he is due for one this year.  Father with colon polyps in his 28s. Paternal grandmother with colon cancer in her 29s.  No other known family history of colon cancer or polyps. No family history of uterine/endometrial cancer, pancreatic cancer or gastric/stomach cancer.   Past Medical History:  Diagnosis Date  . Anxiety and depression 12/23/2016  . Controlled type 2 diabetes mellitus without complication, without long-term current use of insulin ([place]) 12/23/2016  . GERD (gastroesophageal reflux disease) 12/23/2016  . History of esophageal stricture    dilation done in 2016 at [facility]  . Hypertension associated with diabetes ([place]) 12/23/2016  . Uncontrolled diabetes mellitus type 2 without complications [id number]    Past Surgical History:  Procedure Laterality Date  . ABDOMINAL SURGERY     as a child  . ESOPHAGOGASTRODUODENOSCOPY  08/02/2014   mild chronic nonspecific gastritis, stomach and endoscopic biopises. reflux esophagitis  . ESOPHAGOGASTRODUODENOSCOPY (EGD) WITH ESOPHAGEAL DILATION     mild chronic stomach gastritis per record in 2016  . NASAL SEPTUM SURGERY    . TONSILLECTOMY    . VASECTOMY      Current Outpatient Medications  Medication Sig Dispense Refill  . atorvastatin (LIPITOR) 10 MG tablet TAKE 1 TABLET BY MOUTH EVERY DAY 30 tablet 5  .  enalapril (VASOTEC) 5 MG tablet Take 1 tablet (5 mg total) by mouth daily. 90 tablet 1  . escitalopram (LEXAPRO) 20 MG tablet TAKE 1 TABLET BY MOUTH EVERY DAY 90 tablet 1  . Exenatide ER (BYDUREON BCISE) 2 MG/0.85ML AUIJ  Inject 2 mg into the skin once a week. 0.85 pen 3  . glucose blood test strip Test once a day. Pt uses one touch verio flex meter 100 each 2  . JARDIANCE 10 MG TABS tablet TAKE 1 TABLET BY MOUTH EVERY DAY 30 tablet 5  . metFORMIN (GLUCOPHAGE) 1000 MG tablet TAKE 1 TABLET BY MOUTH TWICE A DAY WITH MEALS 180 tablet 0  . omeprazole (PRILOSEC) 20 MG capsule TAKE 1 CAPSULE BY MOUTH EVERY DAY 90 capsule 0  . ONETOUCH DELICA LANCETS FINE MISC Test once a day 100 each 1  . sildenafil (REVATIO) 20 MG tablet Take up to 5 tablets (100 mg) 30-60 minutes before sexual activity. No more than 100 mg in a 24 hour period. 30 tablet 0   No current facility-administered medications for this visit.    Allergies as of 06/02/2019 - Review Complete 05/04/2019  Allergen Reaction Noted  . Sulfa antibiotics Hives 08/13/2016    Family History  Problem Relation Age of Onset  . Lung cancer Mother 56  . Lung cancer Maternal Grandfather   . Colon cancer Paternal Grandmother   . CAD Brother     Social History   Socioeconomic History  . Marital status: Single    Spouse name: Not on file  . Number of children: Not on file  . Years of education: Not on file  . Highest education level: Not on file  Occupational History  . Not on file  Tobacco Use  . Smoking status: Never Smoker  . Smokeless tobacco: Current User    Types: Snuff  Substance and Sexual Activity  . 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 Drug use: No  . Sexual activity: Yes    Partners: Female  Other Topics Concern  . Not on file  Social History Narrative   Married in 12/2016. Works as a Furniture conservator/restorer. Smokeless tobacco x 35 years and is contemplating stopping. Enjoys fishing and hunting.    Social Determinants of Health   Financial Resource Strain:   . Difficulty of Paying Living Expenses: Not on file  Food Insecurity:   . Worried About Charity fundraiser in the Last Year: Not on file  . Ran Out of Food in  the Last Year: Not on file  Transportation Needs:   . Lack of Transportation (Medical): Not on file  . Lack of Transportation (Non-Medical): Not on file  Physical Activity:   . Days of Exercise per Week: Not on file  . Minutes of Exercise per Session: Not on file  Stress:   . Feeling of Stress : Not on file  Social Connections:   . Frequency of Communication with Friends and Family: Not on file  . Frequency of Social Gatherings with Friends and Family: Not on file  . Attends Religious Services: Not on file  . Active Member of Clubs or Organizations: Not on file  . Attends Archivist Meetings: Not on file  . Marital Status: Not on file  Intimate Partner Violence:   . Fear of Current or Ex-Partner: Not on file  . Emotionally Abused: Not on file  . Physically Abused: Not on file  . Sexually Abused: Not on file  Review of Systems: 12 system ROS is negative except as noted above.   Physical Exam: General:   Alert,  well-nourished, pleasant and cooperative in NAD Head:  Normocephalic and atraumatic. Eyes:  Sclera clear, no icterus.   Conjunctiva pink. Ears:  Normal auditory acuity. Nose:  No deformity, discharge,  or lesions. Mouth:  No deformity or lesions.   Neck:  Supple; no masses or thyromegaly. Lungs:  Clear throughout to auscultation.   No wheezes. Heart:  Regular rate and rhythm; no murmurs. Abdomen:  Soft, nontender, nondistended, normal bowel sounds, no rebound or guarding. No hepatosplenomegaly.   Rectal:  Deferred  Msk:  Symmetrical. No boney deformities LAD: No inguinal or umbilical LAD Extremities:  No clubbing or edema. Neurologic:  Alert and  oriented x4;  grossly nonfocal Skin:  Intact without significant lesions or rashes. Psych:  Alert and cooperative. Normal mood and affect.    Keyondra Lagrand L. Tarri Glenn, MD, MPH 06/02/2019, 1:38 PM

## 2019-06-02 NOTE — Patient Instructions (Signed)
Increase Omeprazole to 40 mg every morning.   You have been scheduled for an endoscopy and colonoscopy. Please follow the written instructions given to you at your visit today. Please pick up your prep supplies at the pharmacy within the next 1-3 days. If you use inhalers (even only as needed), please bring them with you on the day of your procedure.  You have been scheduled for a Barium Esophogram at [facility] (1st floor of the hospital) on 06-08-19 at 9:30 am. Please arrive 15 minutes prior to your appointment for registration. Make certain not to have anything to eat or drink 3 hours prior to your test. If you need to reschedule for any reason, please contact radiology at [phone] to do so. __________________________________________________________________ A barium swallow is an examination that concentrates on views of the esophagus. This tends to be a double contrast exam (barium and two liquids which, when combined, create a gas to distend the wall of the oesophagus) or single contrast (non-ionic iodine based). The study is usually tailored to your symptoms so a good history is essential. Attention is paid during the study to the form, structure and configuration of the esophagus, looking for functional disorders (such as aspiration, dysphagia, achalasia, motility and reflux) EXAMINATION You may be asked to change into a gown, depending on the type of swallow being performed. A radiologist and radiographer will perform the procedure. The radiologist will advise you of the type of contrast selected for your procedure and direct you during the exam. You will be asked to stand, sit or lie in several different positions and to hold a small amount of fluid in your mouth before being asked to swallow while the imaging is performed .In some instances you may be asked to swallow barium coated marshmallows to assess the motility of a solid food bolus. The exam can be recorded as a digital or  video fluoroscopy procedure. POST PROCEDURE It will take 1-2 days for the barium to pass through your system. To facilitate this, it is important, unless otherwise directed, to increase your fluids for the next 24-48hrs and to resume your normal diet.  This test typically takes about 30 minutes to perform. __________________________________________________________________________________  Tips for colonoscopy:  -STAY WELL HYDRATED FOR 3-4 DAYS PRIOR TO THE EXAM. This reduces nausea and dehydration.  -TO PREVENT SKIN/HEMORRHOID IRRITATION- prior to wiping, put A&Dointment or vaseline on the toilet paper. -Keep a towel or pad on the bed.  -DRINK 64oz of clear liquids in the morning of prep day (PRIOR TO STARTING THE PREP) to be sure that there is enough fluid to flush the colon and stay hydrated!!!! This is in addition to the fluids required for preparation.

## 2019-06-05 HISTORY — PX: COLONOSCOPY: SHX174

## 2019-06-08 ENCOUNTER — Ambulatory Visit (HOSPITAL_COMMUNITY): Payer: No Typology Code available for payment source

## 2019-06-12 ENCOUNTER — Telehealth: Payer: Self-pay | Admitting: Gastroenterology

## 2019-06-12 NOTE — Telephone Encounter (Signed)
Left patient detailed message will switch prep to plenvu and he can come by and pick up sample and new instructions.

## 2019-06-12 NOTE — Telephone Encounter (Signed)
Pt stated that his Suprep is $160 and requested a cheaper alternative.

## 2019-06-14 ENCOUNTER — Other Ambulatory Visit: Payer: Self-pay | Admitting: Gastroenterology

## 2019-06-14 ENCOUNTER — Ambulatory Visit (INDEPENDENT_AMBULATORY_CARE_PROVIDER_SITE_OTHER): Payer: No Typology Code available for payment source

## 2019-06-14 DIAGNOSIS — Z1159 Encounter for screening for other viral diseases: Secondary | ICD-10-CM

## 2019-06-14 LAB — SARS CORONAVIRUS 2 (TAT 6-24 HRS): SARS Coronavirus 2: NEGATIVE

## 2019-06-16 ENCOUNTER — Encounter: Payer: Self-pay | Admitting: Gastroenterology

## 2019-06-16 ENCOUNTER — Other Ambulatory Visit: Payer: Self-pay

## 2019-06-16 ENCOUNTER — Ambulatory Visit (AMBULATORY_SURGERY_CENTER): Payer: No Typology Code available for payment source | Admitting: Gastroenterology

## 2019-06-16 VITALS — BP 113/74 | HR 65 | Temp 96.9°F | Resp 18 | Ht 69.0 in | Wt 178.0 lb

## 2019-06-16 DIAGNOSIS — K2950 Unspecified chronic gastritis without bleeding: Secondary | ICD-10-CM

## 2019-06-16 DIAGNOSIS — K219 Gastro-esophageal reflux disease without esophagitis: Secondary | ICD-10-CM | POA: Diagnosis not present

## 2019-06-16 DIAGNOSIS — Z800 Family history of malignant neoplasm of digestive organs: Secondary | ICD-10-CM

## 2019-06-16 DIAGNOSIS — K269 Duodenal ulcer, unspecified as acute or chronic, without hemorrhage or perforation: Secondary | ICD-10-CM | POA: Diagnosis not present

## 2019-06-16 DIAGNOSIS — Z1211 Encounter for screening for malignant neoplasm of colon: Secondary | ICD-10-CM | POA: Diagnosis not present

## 2019-06-16 DIAGNOSIS — D132 Benign neoplasm of duodenum: Secondary | ICD-10-CM | POA: Diagnosis not present

## 2019-06-16 DIAGNOSIS — K259 Gastric ulcer, unspecified as acute or chronic, without hemorrhage or perforation: Secondary | ICD-10-CM | POA: Diagnosis not present

## 2019-06-16 DIAGNOSIS — K2980 Duodenitis without bleeding: Secondary | ICD-10-CM | POA: Diagnosis not present

## 2019-06-16 DIAGNOSIS — K3189 Other diseases of stomach and duodenum: Secondary | ICD-10-CM | POA: Diagnosis not present

## 2019-06-16 DIAGNOSIS — Z8371 Family history of colonic polyps: Secondary | ICD-10-CM | POA: Diagnosis not present

## 2019-06-16 DIAGNOSIS — D130 Benign neoplasm of esophagus: Secondary | ICD-10-CM | POA: Diagnosis not present

## 2019-06-16 MED ORDER — SODIUM CHLORIDE 0.9 % IV SOLN: 500.0000 mL | Freq: Once | INTRAVENOUS | Status: DC

## 2019-06-16 MED ORDER — OMEPRAZOLE 40 MG PO CPDR: 40.0000 mg | DELAYED_RELEASE_CAPSULE | Freq: Two times a day (BID) | ORAL | 3 refills | Status: DC

## 2019-06-16 NOTE — Patient Instructions (Signed)
Handouts given hemorrhoids and GERD.  Increase Omeprazole to 40mg  twice daily for 8 weeks, then take daily thereafter.  No NSAIDS (Non-Steroidal anti-inflammatory drugs) .  (These include, aspirin, aspirin-containing products, ibuprofen, advil, motrin, naproxen, aleve, goody powders, etc)  YOU HAD AN ENDOSCOPIC PROCEDURE TODAY AT [facility]:   Refer to the procedure report that was given to you for any specific questions about what was found during the examination.  If the procedure report does not answer your questions, please call your gastroenterologist to clarify.  If you requested that your care partner not be given the details of your procedure findings, then the procedure report has been included in a sealed envelope for you to review at your convenience later.  YOU SHOULD EXPECT: Some feelings of bloating in the abdomen. Passage of more gas than usual.  Walking can help get rid of the air that was put into your GI tract during the procedure and reduce the bloating. If you had a lower endoscopy (such as a colonoscopy or flexible sigmoidoscopy) you may notice spotting of blood in your stool or on the toilet paper. If you underwent a bowel prep for your procedure, you may not have a normal bowel movement for a few days.  Please Note:  You might notice some irritation and congestion in your nose or some drainage.  This is from the oxygen used during your procedure.  There is no need for concern and it should clear up in a day or so.  SYMPTOMS TO REPORT IMMEDIATELY:   Following lower endoscopy (colonoscopy or flexible sigmoidoscopy):  Excessive amounts of blood in the stool  Significant tenderness or worsening of abdominal pains  Swelling of the abdomen that is new, acute  Fever of 100F or higher   Following upper endoscopy (EGD)  Vomiting of blood or coffee ground material  New chest pain or pain under the shoulder blades  Painful or persistently difficult  swallowing  New shortness of breath  Fever of 100F or higher  Black, tarry-looking stools  For urgent or emergent issues, a gastroenterologist can be reached at any hour by calling [phone]. Do not use MyChart messaging for urgent concerns.    DIET:  We do recommend a small meal at first, but then you may proceed to your regular diet.  Drink plenty of fluids but you should avoid alcoholic beverages for 24 hours.  ACTIVITY:  You should plan to take it easy for the rest of today and you should NOT DRIVE or use heavy machinery until tomorrow (because of the sedation medicines used during the test).    FOLLOW UP: Our staff will call the number listed on your records 48-72 hours following your procedure to check on you and address any questions or concerns that you may have regarding the information given to you following your procedure. If we do not reach you, we will leave a message.  We will attempt to reach you two times.  During this call, we will ask if you have developed any symptoms of COVID 19. If you develop any symptoms (ie: fever, flu-like symptoms, shortness of breath, cough etc.) before then, please call [phone].  If you test positive for Covid 19 in the 2 weeks post procedure, please call and report this information to [place].    If any biopsies were taken you will be contacted by phone or by letter within the next 1-3 weeks.  Please call us at [phone] if you have not  heard about the biopsies in 3 weeks.    SIGNATURES/CONFIDENTIALITY: You and/or your care partner have signed paperwork which will be entered into your electronic medical record.  These signatures attest to the fact that that the information above on your After Visit Summary has been reviewed and is understood.  Full responsibility of the confidentiality of this discharge information lies with you and/or your care-partner.

## 2019-06-16 NOTE — Progress Notes (Signed)
Report to PACU, RN, vss, BBS= Clear.  

## 2019-06-16 NOTE — Op Note (Signed)
Jacob Hart Patient Name: Jacob Hart Procedure Date: 06/16/2019 3:06 PM MRN: [id number] Endoscopist: Thornton Park MD, MD Age: 54 Referring MD:  Date of Birth: 1965/04/08 Gender: Male Account #: [id number] Procedure:                Upper GI endoscopy Indications:              GERD                           Intermittent liquid dysphagia Medicines:                Monitored Anesthesia Care Procedure:                Pre-Anesthesia Assessment:                           - Prior to the procedure, a History and Physical                            was performed, and patient medications and                            allergies were reviewed. The patient's tolerance of                            previous anesthesia was also reviewed. The risks                            and benefits of the procedure and the sedation                            options and risks were discussed with the patient.                            All questions were answered, and informed consent                            was obtained. Prior Anticoagulants: The patient has                            taken no previous anticoagulant or antiplatelet                            agents. ASA Grade Assessment: II - A patient with                            mild systemic disease. After reviewing the risks                            and benefits, the patient was deemed in                            satisfactory condition to undergo the procedure.  After obtaining informed consent, the endoscope was                            passed under direct vision. Throughout the                            procedure, the patient's blood pressure, pulse, and                            oxygen saturations were monitored continuously. The                            Endoscope was introduced through the mouth, and                            advanced to the third part of duodenum. The upper          GI endoscopy was accomplished without difficulty.                            The patient tolerated the procedure well. Scope In: Scope Out: Findings:                 The Z-line was irregular and was found 39 cm from                            the incisors. Biopsies were taken with a cold                            forceps for histology. Estimated blood loss was                            minimal.                           The examined esophagus was normal. Biopsies were                            obtained from the proximal and distal esophagus                            with cold forceps for histology of suspected                            eosinophilic esophagitis.                           Multiple localized small erosions with no bleeding                            and no stigmata of recent bleeding were found in                            the gastric body and in the gastric antrum.  Biopsies were taken from the antrum, body, and                            fundus with a cold forceps for histology. Estimated                            blood loss was minimal.                           One non-bleeding linear duodenal ulcer with no                            stigmata of bleeding was found in the second                            portion of the duodenum. The lesion was 4 mm in                            largest dimension. Biopsies were taken with a cold                            forceps for histology. Estimated blood loss was                            minimal.                           The cardia and gastric fundus were normal on                            retroflexion.                           The exam was otherwise without abnormality. Complications:            No immediate complications. Estimated blood loss:                            Minimal. Estimated Blood Loss:     Estimated blood loss was minimal. Impression:               - Z-line irregular,  39 cm from the incisors.                            Biopsied.                           - Normal esophagus. Biopsied.                           - Erosive gastropathy with no bleeding and no                            stigmata of recent bleeding. Biopsied.                           -  Non-bleeding duodenal ulcer with no stigmata of                            bleeding. Biopsied.                           - The examination was otherwise normal. Recommendation:           - Patient has a contact number available for                            emergencies. The signs and symptoms of potential                            delayed complications were discussed with the                            patient. Return to normal activities tomorrow.                            Written discharge instructions were provided to the                            patient.                           - Resume previous diet.                           - Continue present medications. Increase omeprazole                            to 40 mg BID x 8 weeks.                           - Await pathology results.                           - No aspirin, ibuprofen, naproxen, or other                            non-steroidal anti-inflammatory drugs. Thornton Park MD, MD 06/16/2019 3:52:10 PM This report has been signed electronically.

## 2019-06-16 NOTE — Progress Notes (Signed)
Temp by LC  Vitals by CW

## 2019-06-16 NOTE — Progress Notes (Signed)
Called to room to assist during endoscopic procedure.  Patient ID and intended procedure confirmed with present staff. Received instructions for my participation in the procedure from the performing physician.  

## 2019-06-16 NOTE — Op Note (Signed)
[facility] Patient Name: Jacob Hart Procedure Date: 06/16/2019 3:06 PM MRN: [id number] Endoscopist: Thornton Park MD, MD Age: 54 Referring MD:  Date of Birth: [birth year] Gender: Male Account #: [id number] Procedure:                Colonoscopy Indications:              Screening for colorectal malignant neoplasm                           Family history of colon polyps (father at age 68)                           Family history of colon cancer (paternal                            grandmother with colon cancer in her 37s)                           Last colonoscopy 15 years ago Medicines:                Monitored Anesthesia Care Procedure:                Pre-Anesthesia Assessment:                           - Prior to the procedure, a History and Physical                            was performed, and patient medications and                            allergies were reviewed. The patient's tolerance of                            previous anesthesia was also reviewed. The risks                            and benefits of the procedure and the sedation                            options and risks were discussed with the patient.                            All questions were answered, and informed consent                            was obtained. Prior Anticoagulants: The patient has                            taken no previous anticoagulant or antiplatelet                            agents. ASA Grade Assessment: II - A patient with  mild systemic disease. After reviewing the risks                            and benefits, the patient was deemed in                            satisfactory condition to undergo the procedure.                           After obtaining informed consent, the colonoscope                            was passed under direct vision. Throughout the                            procedure, the patient's blood pressure, pulse, and                         oxygen saturations were monitored continuously. The                            Colonoscope was introduced through the anus and                            advanced to the 4 cm into the ileum. A second                            forward view of the right colon was performed. The                            colonoscopy was performed without difficulty. The                            patient tolerated the procedure well. The quality                            of the bowel preparation was good. The terminal                            ileum, ileocecal valve, appendiceal orifice, and                            rectum were photographed. Scope In: 3:19:37 PM Scope Out: 3:35:36 PM Scope Withdrawal Time: 0 hours 13 minutes 17 seconds  Total Procedure Duration: 0 hours 15 minutes 59 seconds  Findings:                 Hemorrhoids were found on perianal exam.                           The colon (entire examined portion) appeared normal.                           The exam was otherwise without abnormality on  direct and retroflexion views. Complications:            No immediate complications. Estimated Blood Loss:     Estimated blood loss: none. Impression:               - Hemorrhoids found on perianal exam.                           - The entire examined colon is normal.                           - The examination was otherwise normal on direct                            and retroflexion views.                           - No specimens collected. Recommendation:           - Patient has a contact number available for                            emergencies. The signs and symptoms of potential                            delayed complications were discussed with the                            patient. Return to normal activities tomorrow.                            Written discharge instructions were provided to the                            patient.                            - Resume previous diet.                           - Continue present medications.                           - Repeat colonoscopy in 5 years for screening                            purposes given the family history. Thornton Park MD, MD 06/16/2019 3:54:16 PM This report has been signed electronically.

## 2019-06-20 ENCOUNTER — Telehealth: Payer: Self-pay

## 2019-06-20 ENCOUNTER — Telehealth: Payer: Self-pay | Admitting: Gastroenterology

## 2019-06-20 NOTE — Telephone Encounter (Signed)
Spoke with pharmacist and canceled Rx for suprep.

## 2019-06-20 NOTE — Telephone Encounter (Signed)
  Follow up Call-  Call back number 06/16/2019  Post procedure Call Back phone  # [phone]  Permission to leave phone message Yes  Some recent data might be hidden     Patient questions:  Do you have a fever, pain , or abdominal swelling? No. Pain Score  0 *  Have you tolerated food without any problems? Yes.    Have you been able to return to your normal activities? Yes.    Do you have any questions about your discharge instructions: Diet   No. Medications  No. Follow up visit  No.  Do you have questions or concerns about your Care? No.  Actions: * If pain score is 4 or above: No action needed, pain <4.  1. Have you developed a fever since your procedure? no  2.   Have you had an respiratory symptoms (SOB or cough) since your procedure? no  3.   Have you tested positive for COVID 19 since your procedure no  4.   Have you had any family members/close contacts diagnosed with the COVID 19 since your procedure?  no   If yes to any of these questions please route to Joylene John, RN and Alphonsa Gin, Therapist, sports.

## 2019-06-20 NOTE — Telephone Encounter (Signed)
Jacob Hart.. not sure if this is want is needed for [facility] procedure?   Please advise

## 2019-06-22 ENCOUNTER — Encounter: Payer: Self-pay | Admitting: Gastroenterology

## 2019-06-23 ENCOUNTER — Encounter: Payer: Self-pay | Admitting: *Deleted

## 2019-06-23 ENCOUNTER — Ambulatory Visit (HOSPITAL_COMMUNITY)
Admission: RE | Admit: 2019-06-23 | Discharge: 2019-06-23 | Disposition: A | Discharge status: Home / Self Care | Payer: No Typology Code available for payment source | Source: Ambulatory Visit | Attending: Gastroenterology | Admitting: Gastroenterology

## 2019-06-23 ENCOUNTER — Other Ambulatory Visit: Payer: Self-pay

## 2019-06-23 DIAGNOSIS — R1310 Dysphagia, unspecified: Secondary | ICD-10-CM | POA: Diagnosis not present

## 2019-07-01 ENCOUNTER — Other Ambulatory Visit: Payer: Self-pay | Admitting: Family Medicine

## 2019-07-01 DIAGNOSIS — E1159 Type 2 diabetes mellitus with other circulatory complications: Secondary | ICD-10-CM

## 2019-07-01 DIAGNOSIS — I152 Hypertension secondary to endocrine disorders: Secondary | ICD-10-CM

## 2019-07-01 LAB — HM DIABETES EYE EXAM

## 2019-07-03 NOTE — Telephone Encounter (Signed)
Had appt in june

## 2019-07-05 ENCOUNTER — Ambulatory Visit
Admission: EM | Admit: 2019-07-05 | Discharge: 2019-07-05 | Disposition: A | Discharge status: Home / Self Care | Payer: No Typology Code available for payment source

## 2019-07-05 ENCOUNTER — Other Ambulatory Visit: Payer: Self-pay

## 2019-07-05 ENCOUNTER — Emergency Department (HOSPITAL_COMMUNITY)
Admission: EM | Admit: 2019-07-05 | Discharge: 2019-07-05 | Disposition: A | Discharge status: Home / Self Care | Payer: No Typology Code available for payment source | Attending: Emergency Medicine | Admitting: Emergency Medicine

## 2019-07-05 ENCOUNTER — Encounter (HOSPITAL_COMMUNITY): Payer: Self-pay

## 2019-07-05 DIAGNOSIS — I10 Essential (primary) hypertension: Secondary | ICD-10-CM | POA: Insufficient documentation

## 2019-07-05 DIAGNOSIS — H1089 Other conjunctivitis: Secondary | ICD-10-CM | POA: Insufficient documentation

## 2019-07-05 DIAGNOSIS — Z79899 Other long term (current) drug therapy: Secondary | ICD-10-CM | POA: Insufficient documentation

## 2019-07-05 DIAGNOSIS — Z7984 Long term (current) use of oral hypoglycemic drugs: Secondary | ICD-10-CM | POA: Insufficient documentation

## 2019-07-05 DIAGNOSIS — E119 Type 2 diabetes mellitus without complications: Secondary | ICD-10-CM | POA: Insufficient documentation

## 2019-07-05 DIAGNOSIS — H5789 Other specified disorders of eye and adnexa: Secondary | ICD-10-CM | POA: Diagnosis present

## 2019-07-05 MED ORDER — TOBRAMYCIN 0.3 % OP SOLN: 1.0000 [drp] | OPHTHALMIC | 0 refills | Status: DC

## 2019-07-05 NOTE — ED Triage Notes (Signed)
[facility] sent patient here for further eval.  Pt said he accidentally splashed black oxide in his right eye yesterday. Has been rinsing it out but it has been red swollen and draining.

## 2019-07-05 NOTE — ED Triage Notes (Signed)
Pt states that he splashed right eye with black oxide yesterday, pt has flushed eyes under tap water and rinsed with saline several times, right eye is red and irritated , vision is intact

## 2019-07-05 NOTE — Discharge Instructions (Addendum)
Follow-up with your ophthalmologist in the next couple days.  Call tomorrow to make an appointment

## 2019-07-07 ENCOUNTER — Other Ambulatory Visit: Payer: Self-pay | Admitting: Family Medicine

## 2019-07-07 NOTE — ED Provider Notes (Signed)
[facility] Provider Note   CSN: [id number] Arrival date & time: 07/05/19  1825     History Chief Complaint  Patient presents with  . Eye Problem    right    Jacob Hart is a 54 y.o. male.  Patient states he got black oxide in his eyes yesterday and rinse them out thoroughly.  Patient complains of discharge and redness to his right eye.  The history is provided by the patient.  Eye Problem Location:  Both eyes Quality:  Burning Severity:  Mild Onset quality:  Sudden Timing:  Constant Progression:  Waxing and waning Chronicity:  New Context: chemical exposure   Relieved by:  Nothing Worsened by:  Nothing Associated symptoms: no discharge and no headaches        Past Medical History:  Diagnosis Date  . Allergy   . Anxiety and depression 12/23/2016  . Controlled type 2 diabetes mellitus without complication, without long-term current use of insulin ([place]) 12/23/2016  . GERD (gastroesophageal reflux disease) 12/23/2016  . History of esophageal stricture    dilation done in 2016 at [facility]  . Hypertension associated with diabetes ([place]) 12/23/2016  . Substance abuse ([place])   . Uncontrolled diabetes mellitus type 2 without complications [id number]    Patient Active Problem List   Diagnosis Date Noted  . Chronic cough 05/04/2019  . History of esophageal dilatation 05/04/2019  . Erectile dysfunction 01/06/2019  . Mild sleep apnea 01/02/2019  . 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 Controlled type 2 diabetes mellitus without complication, without long-term current use of insulin ([place]) 12/23/2016  . Gastroesophageal reflux disease 12/23/2016  . Anxiety and depression 12/23/2016  . Hypertension associated with diabetes ([place]) 12/23/2016    Past Surgical History:  Procedure Laterality Date  . ABDOMINAL SURGERY     as a child  . ESOPHAGOGASTRODUODENOSCOPY  08/02/2014   mild chronic nonspecific  gastritis, stomach and endoscopic biopises. reflux esophagitis  . ESOPHAGOGASTRODUODENOSCOPY (EGD) WITH ESOPHAGEAL DILATION     mild chronic stomach gastritis per record in 2016  . NASAL SEPTUM SURGERY    . TONSILLECTOMY    . VASECTOMY         Family History  Problem Relation Age of Onset  . Lung cancer Mother 16  . Lung cancer Maternal Grandfather   . Colon cancer Paternal Grandmother   . CAD Brother     Social History   Tobacco Use  . Smoking status: Never Smoker  . Smokeless tobacco: Current User    Types: Snuff  Substance Use Topics  . 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 Drug use: No    Home Medications Prior to Admission medications   Medication Sig Start Date End Date Taking? Authorizing Provider  atorvastatin (LIPITOR) 10 MG tablet TAKE 1 TABLET BY MOUTH EVERY DAY 03/28/19   Henson, Vickie L, NP-C  enalapril (VASOTEC) 5 MG tablet TAKE 1 TABLET BY MOUTH EVERY DAY 07/03/19   Henson, Vickie L, NP-C  escitalopram (LEXAPRO) 20 MG tablet TAKE 1 TABLET BY MOUTH EVERY DAY 01/09/19   Henson, Vickie L, NP-C  Exenatide ER (BYDUREON BCISE) 2 MG/0.85ML AUIJ Inject 2 mg into the skin once a week. 04/21/19   Henson, Vickie L, NP-C  glucose blood test strip Test once a day. Pt uses one touch verio flex meter 02/07/18   Henson, Vickie L, NP-C  JARDIANCE 10 MG TABS tablet TAKE 1 TABLET  BY MOUTH EVERY DAY 05/04/19   Henson, Vickie L, NP-C  metFORMIN (GLUCOPHAGE) 1000 MG tablet TAKE 1 TABLET BY MOUTH TWICE A DAY WITH MEALS 04/24/19   Henson, Vickie L, NP-C  omeprazole (PRILOSEC) 40 MG capsule Take 1 capsule (40 mg total) by mouth daily. 06/02/19   Thornton Park, MD  omeprazole (PRILOSEC) 40 MG capsule Take 1 capsule (40 mg total) by mouth in the morning and at bedtime. 06/16/19   Thornton Park, MD  [facility] DELICA LANCETS FINE MISC Test once a day 12/23/16   [name] L, NP-C  sildenafil (REVATIO) 20 MG tablet Take up to 5 tablets (100 mg) 30-60  minutes before sexual activity. No more than 100 mg in a 24 hour period. 01/06/19   Henson, Vickie L, NP-C  tobramycin (TOBREX) 0.3 % ophthalmic solution Place 1 drop into the right eye every 4 (four) hours. 07/05/19   Milton Ferguson, MD    Allergies    Sulfa antibiotics  Review of Systems   Review of Systems  Constitutional: Negative for appetite change and fatigue.  HENT: Negative for congestion, ear discharge and sinus pressure.        Right eye discharge  Eyes: Negative for discharge.  Respiratory: Negative for cough.   Cardiovascular: Negative for chest pain.  Gastrointestinal: Negative for abdominal pain and diarrhea.  Genitourinary: Negative for frequency and hematuria.  Musculoskeletal: Negative for back pain.  Skin: Negative for rash.  Neurological: Negative for seizures and headaches.  Psychiatric/Behavioral: Negative for hallucinations.    Physical Exam Updated Vital Signs BP 135/80   Pulse 74   Temp 98.1 F (36.7 C)   Resp 17   Ht 5\' 9"  (1.753 m)   Wt 80.7 kg   SpO2 98%   BMI 26.29 kg/m   Physical Exam Vitals reviewed.  Constitutional:      Appearance: Normal appearance. He is well-developed.  HENT:     Head: Normocephalic.     Nose: Nose normal.  Eyes:     General: No scleral icterus.    Extraocular Movements: Extraocular movements intact.     Pupils: Pupils are equal, round, and reactive to light.     Comments: No foreign body seen.  Visual acuity normal.  Sclera inflamed.  Patient has a mild discharge.  Neck:     Thyroid: No thyromegaly.  Cardiovascular:     Rate and Rhythm: Normal rate and regular rhythm.     Heart sounds: No murmur. No friction rub. No gallop.   Pulmonary:     Breath sounds: No stridor. No wheezing or rales.  Chest:     Chest wall: No tenderness.  Abdominal:     General: There is no distension.     Tenderness: There is no abdominal tenderness. There is no rebound.  Musculoskeletal:        General: Normal range of motion.      Cervical back: Neck supple.  Lymphadenopathy:     Cervical: No cervical adenopathy.  Skin:    Findings: No erythema or rash.  Neurological:     Mental Status: He is alert and oriented to person, place, and time.     Motor: No abnormal muscle tone.     Coordination: Coordination normal.  Psychiatric:        Behavior: Behavior normal.     ED Results / Procedures / Treatments   Labs (all labs ordered are listed, but only abnormal results are displayed) Labs Reviewed - No data to display  EKG  None  Radiology No results found.  Procedures Procedures (including critical care time)  Medications Ordered in ED Medications - No data to display  ED Course  I have reviewed the triage vital signs and the nursing notes.  Pertinent labs & imaging results that were available during my care of the patient were reviewed by me and considered in my medical decision making (see chart for details).    MDM Rules/Calculators/A&P                     Patient with most likely chemical conjunctivitis.  He will be covered with some Tobrex ophthalmic drops and referred back to his ophthalmologist Final Clinical Impression(s) / ED Diagnoses Final diagnoses:  Other conjunctivitis, unspecified laterality    Rx / DC Orders ED Discharge Orders         Ordered    tobramycin (TOBREX) 0.3 % ophthalmic solution  Every 4 hours     07/05/19 2135           Milton Ferguson, MD 07/07/19 1119

## 2019-07-17 ENCOUNTER — Encounter: Payer: Self-pay | Admitting: Internal Medicine

## 2019-07-20 ENCOUNTER — Other Ambulatory Visit: Payer: Self-pay | Admitting: Family Medicine

## 2019-08-13 ENCOUNTER — Other Ambulatory Visit: Payer: Self-pay | Admitting: Family Medicine

## 2019-08-19 ENCOUNTER — Other Ambulatory Visit: Payer: Self-pay | Admitting: Family Medicine

## 2019-08-21 NOTE — Telephone Encounter (Signed)
Gi has refilled this

## 2019-08-24 ENCOUNTER — Ambulatory Visit: Payer: No Typology Code available for payment source | Admitting: Gastroenterology

## 2019-09-05 ENCOUNTER — Ambulatory Visit: Payer: No Typology Code available for payment source | Admitting: Family Medicine

## 2019-09-10 NOTE — Progress Notes (Deleted)
  Subjective:    Patient ID: Jacob Hart, male    DOB: [birth year], 54 y.o.   MRN: [id number]  Jacob Hart is a 54 y.o. male who presents for follow-up of Type 2 diabetes mellitus.  Patient {is/are not:32546} checking home blood sugars.   Home blood sugar records: {dm home sugars:14018} How often is blood sugars being checked: *** Current symptoms include: {dm sx:14075}. Patient denies {dm sx:19199}.  Patient {is/are not:32546} checking their feet daily. Any Foot concerns (callous, ulcer, wound, thickened nails, toenail fungus, skin fungus, hammer toe): *** Last dilated eye exam: ***  Current treatments: {dm interventions:14074}. Medication compliance: {good/fair/poor:33178}  Current diet: {diet habits:16563} Current exercise: {exercise types:16438} Known diabetic complications: {diabetes complications:1215}  The following portions of the patient's history were reviewed and updated as appropriate: allergies, current medications, past medical history, past social history and problem list.  ROS as in subjective above.     Objective:    Physical Exam Alert and in no distress otherwise not examined.  There were no vitals taken for this visit.  Lab Review Diabetic Labs Latest Ref Rng & Units 05/04/2019 01/02/2019 12/27/2017 08/04/2017 04/02/2017  HbA1c 4.0 - 5.6 % 6.1(A) 7.9(A) 7.5(A) 7.0% 6.9  Microalbumin Not estab mg/dL - - - - -  Micro/Creat Ratio 0 - 29 mg/g creat - <9 <7.6 - -  Chol 100 - 199 mg/dL - 130 122 - 116  HDL >39 mg/dL - 32(L) 34(L) - 31(L)  Calc LDL 0 - 99 mg/dL - 75 71 - 71  Triglycerides 0 - 149 mg/dL - 126 85 - 65  Creatinine 0.76 - 1.27 mg/dL - 0.92 0.83 - 0.70   BP/Weight 07/05/2019 07/05/2019 06/16/2019 06/02/2019 0/80/2233  Systolic BP 612 244 975 300 511  Diastolic BP 80 94 74 74 60  Wt. (Lbs) 178.02 - 178 178 181.6  BMI 26.29 - 26.29 26.29 26.06   Foot/eye exam completion dates Latest Ref Rng & Units 07/01/2019 01/02/2019  Eye Exam No  Retinopathy No Retinopathy -  Foot Form Completion - - Done    Marcel  reports that he has never smoked. His smokeless tobacco use includes snuff. He reports that he does not drink alcohol or use drugs.     Assessment & Plan:    Controlled type 2 diabetes mellitus without complication, without long-term current use of insulin (HCC)  Anxiety and depression  Hypertension associated with diabetes ([place])  1. Rx changes: {none:33079} 2. Education: Reviewed 'ABCs' of diabetes management (respective goals in parentheses):  A1C (<7), blood pressure (<130/80), and cholesterol (LDL <100). 3. Compliance at present is estimated to be {good/fair/poor:33178}. Efforts to improve compliance (if necessary) will be directed at {compliance:16716}. 4. Follow up: {NUMBERS; 0-10:33138} {time:11}

## 2019-09-11 ENCOUNTER — Ambulatory Visit: Payer: No Typology Code available for payment source | Admitting: Family Medicine

## 2019-09-24 ENCOUNTER — Other Ambulatory Visit: Payer: Self-pay | Admitting: Family Medicine

## 2019-09-24 DIAGNOSIS — E1159 Type 2 diabetes mellitus with other circulatory complications: Secondary | ICD-10-CM

## 2019-09-24 DIAGNOSIS — I152 Hypertension secondary to endocrine disorders: Secondary | ICD-10-CM

## 2019-10-06 ENCOUNTER — Other Ambulatory Visit: Payer: Self-pay | Admitting: Family Medicine

## 2019-10-06 NOTE — Telephone Encounter (Signed)
Left message for pt to call back and schedule an appt.

## 2019-10-07 ENCOUNTER — Other Ambulatory Visit: Payer: Self-pay | Admitting: Family Medicine

## 2019-10-10 NOTE — Telephone Encounter (Signed)
Left message for pt to schedule an appt.

## 2019-11-04 ENCOUNTER — Other Ambulatory Visit: Payer: Self-pay | Admitting: Family Medicine

## 2019-11-06 NOTE — Telephone Encounter (Signed)
Pt has upcoming appt will fill for 30 days

## 2019-11-08 ENCOUNTER — Other Ambulatory Visit: Payer: Self-pay | Admitting: Family Medicine

## 2019-11-09 ENCOUNTER — Ambulatory Visit: Payer: No Typology Code available for payment source | Admitting: Family Medicine

## 2019-11-19 NOTE — Progress Notes (Signed)
Subjective:    Patient ID: Jacob Hart, male    DOB: Aug 27, 1965, 54 y.o.   MRN: [id number]  Jacob Hart is a 54 y.o. male who presents for follow-up of Type 2 diabetes mellitus.  GERD- GI increased his omeprazole to 40 mg.   FBS 230 this morning. States his diet has been poor lately. He has been a lot of cookies, sweets.  States he has been stress eating. His wife has been having health issues.  He stopped taking his once weekly injection.  Anxiety and depression- taking Lexapro and feels that is does help him.    Patient is checking home blood sugars.   Home blood sugar records: BGs range between 140 and 230 How often is blood sugars being checked: twice a day Current symptoms include: none. Patient denies increased appetite, nausea, polyuria, visual disturbances, vomiting and weight loss.  Patient is checking their feet daily. Any Foot concerns (callous, ulcer, wound, thickened nails, toenail fungus, skin fungus, hammer toe): none Last dilated eye exam: never  Current treatments: metformin bid, Jardiance 10 mg He is taking atorvastatin 10 mg daily Taking enalapril daily.  Medication compliance: Fair. He stopped GLP-1 injections but did not let me know.  Current diet: in general, a "healthy" diet   Current exercise: gardening Known diabetic complications: none  The following portions of the patient's history were reviewed and updated as appropriate: allergies, current medications, past medical history, past social history and problem list.  ROS as in subjective above.     Objective:    Physical Exam Alert and in no distress otherwise not examined.  Blood pressure 110/62, pulse 77, weight 185 lb 6.4 oz (84.1 kg).  Lab Review Diabetic Labs Latest Ref Rng & Units 11/20/2019 05/04/2019 01/02/2019 12/27/2017 08/04/2017  HbA1c 4.0 - 5.6 % 8.8(A) 6.1(A) 7.9(A) 7.5(A) 7.0%  Microalbumin Not estab mg/dL - - - - -  Micro/Creat Ratio 0 - 29 mg/g creat <12 - <9 <7.6 -    Chol 100 - 199 mg/dL - - 130 122 -  HDL >39 mg/dL - - 32(L) 34(L) -  Calc LDL 0 - 99 mg/dL - - 75 71 -  Triglycerides 0 - 149 mg/dL - - 126 85 -  Creatinine 0.76 - 1.27 mg/dL 0.96 - 0.92 0.83 -   BP/Weight 11/20/2019 07/05/2019 07/05/2019 06/16/2019 12/03/5619  Systolic BP 308 657 846 962 952  Diastolic BP 62 80 94 74 74  Wt. (Lbs) 185.4 178.02 - 178 178  BMI 27.38 26.29 - 26.29 26.29   Foot/eye exam completion dates Latest Ref Rng & Units 07/01/2019 01/02/2019  Eye Exam No Retinopathy No Retinopathy -  Foot Form Completion - - Done    Jamar  reports that he has never smoked. His smokeless tobacco use includes snuff. He reports that he does not drink alcohol and does not use drugs.     Assessment & Plan:    Uncontrolled type 2 diabetes mellitus with hyperglycemia (HCC) - Plan: HgB A1c, Dulaglutide (TRULICITY) 8.41 LK/4.4WN SOPN, empagliflozin (JARDIANCE) 25 MG TABS tablet, CBC with Differential/Platelet, Comprehensive metabolic panel, POCT Urinalysis DIP (Proadvantage Device), Microalbumin / creatinine urine ratio  Hypertension associated with diabetes (HCC)  Anxiety and depression  Gastroesophageal reflux disease, unspecified whether esophagitis present  1. Rx changes: increase Jardiance to 25 mg and add back Trulicity Hgb U2V 2.5% and diabetes no longer controlled.  2. Education: Reviewed 'ABCs' of diabetes management (respective goals in parentheses):  A1C (<7), blood pressure (<130/80),  and cholesterol (LDL <100). 3. Compliance at present is estimated to be fair. Efforts to improve compliance (if necessary) will be directed at dietary modifications: cut back on soda, sugar, sweets, cookies, candy, increased exercise and regular blood sugar monitoring: daily. 4. HTN- BP controlled.  5. GERD- managed by GI. Taking PPI 6. Anxiety and depression- continue Lexapro. Does not want to increase dose. Is not interested in counseling currently. He will let me know.  7. Follow up: 3  months

## 2019-11-20 ENCOUNTER — Encounter: Payer: Self-pay | Admitting: Family Medicine

## 2019-11-20 ENCOUNTER — Other Ambulatory Visit: Payer: Self-pay

## 2019-11-20 ENCOUNTER — Ambulatory Visit (INDEPENDENT_AMBULATORY_CARE_PROVIDER_SITE_OTHER): Payer: No Typology Code available for payment source | Admitting: Family Medicine

## 2019-11-20 VITALS — BP 110/62 | HR 77 | Wt 185.4 lb

## 2019-11-20 DIAGNOSIS — E1165 Type 2 diabetes mellitus with hyperglycemia: Secondary | ICD-10-CM | POA: Diagnosis not present

## 2019-11-20 DIAGNOSIS — F32A Depression, unspecified: Secondary | ICD-10-CM

## 2019-11-20 DIAGNOSIS — K219 Gastro-esophageal reflux disease without esophagitis: Secondary | ICD-10-CM

## 2019-11-20 DIAGNOSIS — E113299 Type 2 diabetes mellitus with mild nonproliferative diabetic retinopathy without macular edema, unspecified eye: Secondary | ICD-10-CM

## 2019-11-20 DIAGNOSIS — E1159 Type 2 diabetes mellitus with other circulatory complications: Secondary | ICD-10-CM | POA: Diagnosis not present

## 2019-11-20 DIAGNOSIS — F329 Major depressive disorder, single episode, unspecified: Secondary | ICD-10-CM

## 2019-11-20 DIAGNOSIS — F419 Anxiety disorder, unspecified: Secondary | ICD-10-CM

## 2019-11-20 DIAGNOSIS — I152 Hypertension secondary to endocrine disorders: Secondary | ICD-10-CM

## 2019-11-20 DIAGNOSIS — I10 Essential (primary) hypertension: Secondary | ICD-10-CM

## 2019-11-20 LAB — POCT GLYCOSYLATED HEMOGLOBIN (HGB A1C): Hemoglobin A1C: 8.8 % — AB (ref 4.0–5.6)

## 2019-11-20 MED ORDER — EMPAGLIFLOZIN 25 MG PO TABS: 25.0000 mg | ORAL_TABLET | Freq: Every day | ORAL | 2 refills | Status: DC

## 2019-11-20 MED ORDER — TRULICITY 0.75 MG/0.5ML ~~LOC~~ SOAJ: 0.7500 mg | SUBCUTANEOUS | 2 refills | Status: DC

## 2019-11-20 NOTE — Patient Instructions (Signed)
Hemoglobin A1c is 8.8% and your diabetes is now uncontrolled.  Start the Trulicity injections once weekly as discussed. Let me know if this is not affordable please.  I am increasing your Jardiance dose to 25 mg. I sent a new prescription to your pharmacy.  Continue on your other medications.  Cut back on sugar, sweets, candy and cookies as well as soda.

## 2019-11-21 LAB — COMPREHENSIVE METABOLIC PANEL
ALT: 20 IU/L (ref 0–44)
AST: 14 IU/L (ref 0–40)
Albumin/Globulin Ratio: 2.8 — ABNORMAL HIGH (ref 1.2–2.2)
Albumin: 4.7 g/dL (ref 3.8–4.9)
Alkaline Phosphatase: 94 IU/L (ref 48–121)
BUN/Creatinine Ratio: 14 (ref 9–20)
BUN: 13 mg/dL (ref 6–24)
Bilirubin Total: 0.3 mg/dL (ref 0.0–1.2)
CO2: 26 mmol/L (ref 20–29)
Calcium: 9.1 mg/dL (ref 8.7–10.2)
Chloride: 99 mmol/L (ref 96–106)
Creatinine, Ser: 0.96 mg/dL (ref 0.76–1.27)
GFR calc Af Amer: 104 mL/min/{1.73_m2} (ref >59)
GFR calc non Af Amer: 90 mL/min/{1.73_m2} (ref >59)
Globulin, Total: 1.7 g/dL (ref 1.5–4.5)
Glucose: 253 mg/dL — ABNORMAL HIGH (ref 65–99)
Potassium: 4.5 mmol/L (ref 3.5–5.2)
Sodium: 136 mmol/L (ref 134–144)
Total Protein: 6.4 g/dL (ref 6.0–8.5)

## 2019-11-21 LAB — CBC WITH DIFFERENTIAL/PLATELET
Basophils Absolute: 0 10*3/uL (ref 0.0–0.2)
Basos: 1 %
EOS (ABSOLUTE): 0.2 10*3/uL (ref 0.0–0.4)
Eos: 3 %
Hematocrit: 46.6 % (ref 37.5–51.0)
Hemoglobin: 15.8 g/dL (ref 13.0–17.7)
Immature Grans (Abs): 0 10*3/uL (ref 0.0–0.1)
Immature Granulocytes: 1 %
Lymphocytes Absolute: 1.9 10*3/uL (ref 0.7–3.1)
Lymphs: 29 %
MCH: 31.6 pg (ref 26.6–33.0)
MCHC: 33.9 g/dL (ref 31.5–35.7)
MCV: 93 fL (ref 79–97)
Monocytes Absolute: 0.6 10*3/uL (ref 0.1–0.9)
Monocytes: 9 %
Neutrophils Absolute: 3.8 10*3/uL (ref 1.4–7.0)
Neutrophils: 57 %
Platelets: 230 10*3/uL (ref 150–450)
RBC: 5 x10E6/uL (ref 4.14–5.80)
RDW: 12.7 % (ref 11.6–15.4)
WBC: 6.5 10*3/uL (ref 3.4–10.8)

## 2019-11-21 LAB — MICROALBUMIN / CREATININE URINE RATIO
Creatinine, Urine: 25.5 mg/dL
Microalb/Creat Ratio: <12 mg/g creat (ref 0–29)
Microalbumin, Urine: <3 ug/mL

## 2019-12-10 ENCOUNTER — Other Ambulatory Visit: Payer: Self-pay | Admitting: Family Medicine

## 2019-12-12 ENCOUNTER — Other Ambulatory Visit: Payer: Self-pay | Admitting: Family Medicine

## 2019-12-12 NOTE — Telephone Encounter (Signed)
Pt. Requesting refill on escitalopram last filled 11/06/19 last apt was 11/20/19 and next apt is 01/22/20

## 2019-12-18 ENCOUNTER — Other Ambulatory Visit: Payer: Self-pay | Admitting: Family Medicine

## 2019-12-18 DIAGNOSIS — I152 Hypertension secondary to endocrine disorders: Secondary | ICD-10-CM

## 2020-01-07 ENCOUNTER — Other Ambulatory Visit: Payer: Self-pay | Admitting: Family Medicine

## 2020-01-21 NOTE — Patient Instructions (Addendum)
Here is a list of Eye Doctors that you call and schedule an appointment with.  Dr. Katy Fitch Address: [address] [place], [place] [address] Phone: [phone]  [facility] Address: [place], [place], [place] [address] Phone: [phone]  [facility] Address: [address], [place], [place] [address] Phone: [phone]     You can call to schedule your appointment with the counselor. A few offices are listed below for you to call.    [facility] [address] [place], [place] [address]  Phone: [facility]  [place], [place], [place] [address]  Phone: [phone]  [facility] [address] [place], [place] [address] [phone] [phone] for appointments   [facility]  [address] [place], [place] [address] [place]  [address] [place]  (across from [facility])  [facility] [address] [place], [place] [address] [phone]   Preventive Care 24-49 Years Old, Male Preventive care refers to lifestyle choices and visits with your health care provider that can promote health and wellness. This includes:  A yearly physical exam. This is also called an annual well check.  Regular dental and eye exams.  Immunizations.  Screening for certain conditions.  Healthy lifestyle choices, such as eating a healthy diet, getting regular exercise, not using drugs or products that contain nicotine and tobacco, and limiting alcohol use. What can I expect for my preventive care visit? Physical exam Your health care provider will check:  Height and weight. These may be used to calculate body mass index (BMI), which is a measurement that tells if you are at a healthy weight.  Heart rate and blood pressure.  Your skin  for abnormal spots. Counseling Your health care provider may ask you questions about:  Alcohol, tobacco, and drug use.  Emotional well-being.  Home and relationship well-being.  Sexual activity.  Eating habits.  Work and work Statistician. What immunizations do I need?  Influenza (flu) vaccine  This is recommended every year. Tetanus, diphtheria, and pertussis (Tdap) vaccine  You may need a Td booster every 10 years. Varicella (chickenpox) vaccine  You may need this vaccine if you have not already been vaccinated. Zoster (shingles) vaccine  You may need this after age 93. Measles, mumps, and rubella (MMR) vaccine  You may need at least one dose of MMR if you were born in 1957 or later. You may also need a second dose. Pneumococcal conjugate (PCV13) vaccine  You may need this if you have certain conditions and were not previously vaccinated. Pneumococcal polysaccharide (PPSV23) vaccine  You may need one or two doses if you smoke cigarettes or if you have certain conditions. Meningococcal conjugate (MenACWY) vaccine  You may need this if you have certain conditions. Hepatitis A vaccine  You may need this if you have certain conditions or if you travel or work in places where you may be exposed to hepatitis A. Hepatitis B vaccine  You may need this if you have certain conditions or if you travel or work in places where you may be exposed to hepatitis B. Haemophilus influenzae type b (Hib) vaccine  You may need this if you have certain risk factors. Human papillomavirus (HPV) vaccine  If recommended by your health care provider, you may need three  doses over 6 months. You may receive vaccines as individual doses or as more than one vaccine together in one shot (combination vaccines). Talk with your health care provider about the risks and benefits of combination vaccines. What tests do I need? Blood tests  Lipid and cholesterol levels. These may be checked every 5  years, or more frequently if you are over 46 years old.  Hepatitis C test.  Hepatitis B test. Screening  Lung cancer screening. You may have this screening every year starting at age 19 if you have a 30-pack-year history of smoking and currently smoke or have quit within the past 15 years.  Prostate cancer screening. Recommendations will vary depending on your family history and other risks.  Colorectal cancer screening. All adults should have this screening starting at age 46 and continuing until age 32. Your health care provider may recommend screening at age 39 if you are at increased risk. You will have tests every 1-10 years, depending on your results and the type of screening test.  Diabetes screening. This is done by checking your blood sugar (glucose) after you have not eaten for a while (fasting). You may have this done every 1-3 years.  Sexually transmitted disease (STD) testing. Follow these instructions at home: Eating and drinking  Eat a diet that includes fresh fruits and vegetables, whole grains, lean protein, and low-fat dairy products.  Take vitamin and mineral supplements as recommended by your health care provider.  Do not drink alcohol if your health care provider tells you not to drink.  If you drink alcohol: ? Limit how much you have to 0-2 drinks a day. ? Be aware of how much alcohol is in your drink. In the [place] one drink equals one 12 oz bottle of beer (355 mL), one 5 oz glass of wine (148 mL), or one 1 oz glass of hard liquor (44 mL). Lifestyle  Take daily care of your teeth and gums.  Stay active. Exercise for at least 30 minutes on 5 or more days each week.  Do not use any products that contain nicotine or tobacco, such as cigarettes, e-cigarettes, and chewing tobacco. If you need help quitting, ask your health care provider.  If you are sexually active, practice safe sex. Use a condom or other form of protection to prevent STIs (sexually transmitted  infections).  Talk with your health care provider about taking a low-dose aspirin every day starting at age 39. What's next?  Go to your health care provider once a year for a well check visit.  Ask your health care provider how often you should have your eyes and teeth checked.  Stay up to date on all vaccines. This information is not intended to replace advice given to you by your health care provider. Make sure you discuss any questions you have with your health care provider. Document Revised: 03/17/2018 Document Reviewed: 03/17/2018 Elsevier Patient Education  2020 [employer].

## 2020-01-21 NOTE — Progress Notes (Signed)
Subjective:    Patient ID: Jacob Hart, male    DOB: Oct 20, 1965, 54 y.o.   MRN: [id number]  HPI Chief Complaint  Patient presents with  . fasting cpe,    fasting cpe, panic attacks x 2 in the last month- under stress, ringing in ears x 2 months and dizziness x 1 month   He is here for a complete physical exam.  Other providers: Dr. Tarri Glenn - GI   DM- last Hgb A1c 8.8% on 11/20/2019 FBS this morning 98  HTN- doing well. Taking enalapril   Anxiety - taking Lexapro 20 mg and has been on this  Quick tempered and feels like he is at breaking point.  Denies SI or HI.  Stays busy. Works in his garden.  Usually he eats chocolate for stress but he has not been doing this.   Complains of a 4 week history of frontal headache, dizziness with head movement and worseness tinnitus.  Denies hx of headaches. Headache is constant.  Tinnitus for years which usually is intermittent. Has been constant for the past 2 months. States it is getting worse. He is able to sleep.   Social history: Lives with wife, works as a Furniture conservator/restorer  Denies smoking, drinking alcohol, drug use Diet: vegetarian  Exercise: camping, gardening, hiking   Immunizations: UTD.   Health maintenance:  Colonoscopy: 03/08/2021and due in 2026  Last PSA: 12/2018 Last Dental Exam: Last Eye Exam:  Wears seatbelt always, smoke detectors in home and functioning, does not text while driving, feels safe in home environment.  Reviewed allergies, medications, past medical, surgical, family, and social history.     Review of Systems Review of Systems Constitutional: -fever, -chills, -sweats, -unexpected weight change,-fatigue ENT: -runny nose, -ear pain, -sore throat, +tinnitus Cardiology:  -chest pain, -palpitations, -edema Respiratory: -cough, -shortness of breath, -wheezing Gastroenterology: -abdominal pain, -nausea, -vomiting, -diarrhea, -constipation  Hematology: -bleeding or bruising  problems Musculoskeletal: -arthralgias, -myalgias, -joint swelling, -back pain Ophthalmology: -vision changes Urology: -dysuria, -difficulty urinating, -hematuria, -urinary frequency, -urgency Neurology: +headache, -weakness, -tingling, -numbness, +dizziness     Objective:   Physical Exam BP 124/70 (BP Location: Right Arm, Patient Position: Standing, Cuff Size: Large)   Pulse 78   Temp 97.7 F (36.5 C)   Ht 5\' 10"  (1.778 m)   Wt 179 lb (81.2 kg)   BMI 25.68 kg/m   General Appearance:    Alert, cooperative, no distress, appears stated age  Head:    Normocephalic, without obvious abnormality, atraumatic  Eyes:    PERRL, conjunctiva/corneas clear, EOM's intact.  Dizziness elicited with EOM test.  Peripheral vision intact but he noted diplopia worse on the left  Ears:    Normal TM's and external ear canals  Nose:  No sinus tenderness.  Mask on  Throat:  Mask on  Neck:   Supple, no lymphadenopathy;  thyroid:  no   enlargement/tenderness/nodules; no JVD  Back:    Spine nontender, no curvature, ROM normal, no CVA     tenderness  Lungs:     Clear to auscultation bilaterally without wheezes, rales or     ronchi; respirations unlabored  Chest Wall:    No tenderness or deformity   Heart:    Regular rate and rhythm, S1 and S2 normal, no murmur, rub   or gallop  Breast Exam:    No chest wall tenderness, masses or gynecomastia  Abdomen:     Soft, non-tender, nondistended, normoactive bowel sounds,    no masses, no hepatosplenomegaly  Genitalia:   Not done     Extremities:   No clubbing, cyanosis or edema  Pulses:   2+ and symmetric all extremities  Skin:   Skin color, texture, turgor normal, no rashes or lesions  Lymph nodes:   Cervical, supraclavicular, and axillary nodes normal  Neurologic:   CNII-XII intact, normal strength, sensation and gait; reflexes 2+ and symmetric throughout.  No ataxia, normal heel-to-shin and negative Romberg          Psych:   Normal mood, affect, hygiene and  grooming.         Assessment & Plan:  Routine general medical examination at a health care facility - Plan: CBC with Differential/Platelet, Comprehensive metabolic panel, T4, free, T3, TSH, POCT Urinalysis DIP (Proadvantage Device), Lipid panel -Here today for fasting CPE.  Preventive health care reviewed.  Up-to-date on colonoscopy.  Screening PSA done.  He will call and schedule an eye exam.  Immunizations reviewed.  Discussed safety and health promotion.  Uncontrolled type 2 diabetes mellitus with hyperglycemia ([place]) - Plan: Lipid panel -Last hemoglobin A1c 8.8% on November 20, 2019.  Since then we have added Trulicity and increase Jardiance dose.  He reports blood sugars improving with his fasting blood sugar 98 this morning.  Asymptomatic.  He will call and schedule a diabetic eye exam  Hypertension associated with diabetes ([place]) -Blood pressure well controlled.  Continue enalapril and low-sodium diet.  Anxiety and depression -Worsening anxiety and now having panic attacks at work.  Reports good compliance with Lexapro 20 mg.  He has been reluctant to see a counselor in the past but he is now agreeable.  I will provide him with a list and he will call and schedule.  We also discussed coping mechanisms and distraction techniques which he is using including seeing the serenity prayer and self talk.  Discussed the potential of adding a new anxiety medication and I will send this over.  He is aware that we will need to follow-up in 4 weeks.  Need for hepatitis C screening test - Plan: Hepatitis C antibody -Done per protocol  Tremor of both hands - Plan: T4, free, T3, TSH, MR Brain W Wo Contrast This is new.  He also has new onset head tremor. Check labs and get a brain MRI. Refer to neurology.   New onset of headaches after age 58 - Plan: MR Brain W Wo Contrast -Constant headache for the past month.  This is new.  Brain MRI and refer to neurology   Dizziness - Plan: MR Brain W Wo  Contrast -New onset dizziness with head movement . brain MRI.  Discussed differentials including vertigo vs Mnire's.  Consider meclizine after MRI results.  Am also planning to refer him to neurology for new onset headache, dizziness, tinnitus and new tremor  Diplopia - Plan: MR Brain W Wo Contrast -Brain MRI, refer to ophthalmology  Screening for prostate cancer - Plan: PSA -Normal PSA in 2020.  Follow-up pending result  Family history of prostate cancer in father - Plan: PSA  Tinnitus of both ears - Plan: MR Brain W Wo Contrast -This seems to be worsening and more constant. brain MRI ordered. refer to ENT

## 2020-01-22 ENCOUNTER — Ambulatory Visit (HOSPITAL_COMMUNITY): Admission: RE | Admit: 2020-01-22 | Payer: No Typology Code available for payment source | Source: Ambulatory Visit

## 2020-01-22 ENCOUNTER — Other Ambulatory Visit: Payer: Self-pay

## 2020-01-22 ENCOUNTER — Encounter: Payer: Self-pay | Admitting: Family Medicine

## 2020-01-22 ENCOUNTER — Encounter: Payer: Self-pay | Admitting: Internal Medicine

## 2020-01-22 ENCOUNTER — Ambulatory Visit (INDEPENDENT_AMBULATORY_CARE_PROVIDER_SITE_OTHER): Payer: No Typology Code available for payment source | Admitting: Family Medicine

## 2020-01-22 VITALS — BP 124/70 | HR 78 | Temp 97.7°F | Ht 70.0 in | Wt 179.0 lb

## 2020-01-22 DIAGNOSIS — E1159 Type 2 diabetes mellitus with other circulatory complications: Secondary | ICD-10-CM | POA: Diagnosis not present

## 2020-01-22 DIAGNOSIS — Z0000 Encounter for general adult medical examination without abnormal findings: Secondary | ICD-10-CM | POA: Diagnosis not present

## 2020-01-22 DIAGNOSIS — Z8042 Family history of malignant neoplasm of prostate: Secondary | ICD-10-CM

## 2020-01-22 DIAGNOSIS — F419 Anxiety disorder, unspecified: Secondary | ICD-10-CM | POA: Diagnosis not present

## 2020-01-22 DIAGNOSIS — Z125 Encounter for screening for malignant neoplasm of prostate: Secondary | ICD-10-CM

## 2020-01-22 DIAGNOSIS — E1165 Type 2 diabetes mellitus with hyperglycemia: Secondary | ICD-10-CM

## 2020-01-22 DIAGNOSIS — F32A Depression, unspecified: Secondary | ICD-10-CM

## 2020-01-22 DIAGNOSIS — Z1159 Encounter for screening for other viral diseases: Secondary | ICD-10-CM

## 2020-01-22 DIAGNOSIS — I152 Hypertension secondary to endocrine disorders: Secondary | ICD-10-CM

## 2020-01-22 DIAGNOSIS — H9313 Tinnitus, bilateral: Secondary | ICD-10-CM

## 2020-01-22 DIAGNOSIS — R42 Dizziness and giddiness: Secondary | ICD-10-CM

## 2020-01-22 DIAGNOSIS — H532 Diplopia: Secondary | ICD-10-CM

## 2020-01-22 DIAGNOSIS — R251 Tremor, unspecified: Secondary | ICD-10-CM

## 2020-01-22 DIAGNOSIS — R519 Headache, unspecified: Secondary | ICD-10-CM

## 2020-01-22 LAB — POCT URINALYSIS DIP (PROADVANTAGE DEVICE)
Bilirubin, UA: NEGATIVE
Blood, UA: NEGATIVE
Glucose, UA: 500 mg/dL — AB
Ketones, POC UA: NEGATIVE mg/dL
Leukocytes, UA: NEGATIVE
Nitrite, UA: NEGATIVE
Protein Ur, POC: NEGATIVE mg/dL
Specific Gravity, Urine: 1.01
Urobilinogen, Ur: NEGATIVE
pH, UA: 6 (ref 5.0–8.0)

## 2020-01-23 ENCOUNTER — Ambulatory Visit (HOSPITAL_COMMUNITY)
Admission: RE | Admit: 2020-01-23 | Discharge: 2020-01-23 | Disposition: A | Discharge status: Home / Self Care | Payer: No Typology Code available for payment source | Source: Ambulatory Visit | Attending: Family Medicine | Admitting: Family Medicine

## 2020-01-23 DIAGNOSIS — H9313 Tinnitus, bilateral: Secondary | ICD-10-CM | POA: Insufficient documentation

## 2020-01-23 DIAGNOSIS — R42 Dizziness and giddiness: Secondary | ICD-10-CM | POA: Insufficient documentation

## 2020-01-23 DIAGNOSIS — R251 Tremor, unspecified: Secondary | ICD-10-CM | POA: Diagnosis not present

## 2020-01-23 DIAGNOSIS — H532 Diplopia: Secondary | ICD-10-CM | POA: Diagnosis present

## 2020-01-23 DIAGNOSIS — R519 Headache, unspecified: Secondary | ICD-10-CM | POA: Insufficient documentation

## 2020-01-23 LAB — LIPID PANEL
Chol/HDL Ratio: 3.5 ratio (ref 0.0–5.0)
Cholesterol, Total: 95 mg/dL — ABNORMAL LOW (ref 100–199)
HDL: 27 mg/dL — ABNORMAL LOW
LDL Chol Calc (NIH): 53 mg/dL (ref 0–99)
Triglycerides: 66 mg/dL (ref 0–149)
VLDL Cholesterol Cal: 15 mg/dL (ref 5–40)

## 2020-01-23 LAB — CBC WITH DIFFERENTIAL/PLATELET
Basophils Absolute: 0 10*3/uL (ref 0.0–0.2)
Basos: 1 %
EOS (ABSOLUTE): 0.1 10*3/uL (ref 0.0–0.4)
Eos: 2 %
Hematocrit: 49.4 % (ref 37.5–51.0)
Hemoglobin: 16.8 g/dL (ref 13.0–17.7)
Immature Grans (Abs): 0 10*3/uL (ref 0.0–0.1)
Immature Granulocytes: 1 %
Lymphocytes Absolute: 1.4 10*3/uL (ref 0.7–3.1)
Lymphs: 23 %
MCH: 31.6 pg (ref 26.6–33.0)
MCHC: 34 g/dL (ref 31.5–35.7)
MCV: 93 fL (ref 79–97)
Monocytes Absolute: 0.7 10*3/uL (ref 0.1–0.9)
Monocytes: 11 %
Neutrophils Absolute: 3.8 10*3/uL (ref 1.4–7.0)
Neutrophils: 62 %
Platelets: 237 10*3/uL (ref 150–450)
RBC: 5.32 x10E6/uL (ref 4.14–5.80)
RDW: 13.2 % (ref 11.6–15.4)
WBC: 6 10*3/uL (ref 3.4–10.8)

## 2020-01-23 LAB — COMPREHENSIVE METABOLIC PANEL
ALT: 27 IU/L (ref 0–44)
AST: 16 IU/L (ref 0–40)
Albumin/Globulin Ratio: 2.6 — ABNORMAL HIGH (ref 1.2–2.2)
Albumin: 4.9 g/dL (ref 3.8–4.9)
Alkaline Phosphatase: 93 IU/L (ref 44–121)
BUN/Creatinine Ratio: 18 (ref 9–20)
BUN: 13 mg/dL (ref 6–24)
Bilirubin Total: 0.3 mg/dL (ref 0.0–1.2)
CO2: 20 mmol/L (ref 20–29)
Calcium: 9.2 mg/dL (ref 8.7–10.2)
Chloride: 103 mmol/L (ref 96–106)
Creatinine, Ser: 0.73 mg/dL — ABNORMAL LOW (ref 0.76–1.27)
GFR calc Af Amer: 122 mL/min/{1.73_m2} (ref >59)
GFR calc non Af Amer: 106 mL/min/{1.73_m2} (ref >59)
Globulin, Total: 1.9 g/dL (ref 1.5–4.5)
Glucose: 134 mg/dL — ABNORMAL HIGH (ref 65–99)
Potassium: 4.2 mmol/L (ref 3.5–5.2)
Sodium: 139 mmol/L (ref 134–144)
Total Protein: 6.8 g/dL (ref 6.0–8.5)

## 2020-01-23 LAB — TSH: TSH: 2.33 u[IU]/mL (ref 0.450–4.500)

## 2020-01-23 LAB — T4, FREE: Free T4: 0.92 ng/dL (ref 0.82–1.77)

## 2020-01-23 LAB — PSA: Prostate Specific Ag, Serum: 2.1 ng/mL (ref 0.0–4.0)

## 2020-01-23 LAB — T3: T3, Total: 111 ng/dL (ref 71–180)

## 2020-01-23 LAB — HEPATITIS C ANTIBODY: Hep C Virus Ab: <0.1 {s_co_ratio} (ref 0.0–0.9)

## 2020-01-23 MED ORDER — GADOBUTROL 1 MMOL/ML IV SOLN
8.0000 mL | Freq: Once | INTRAVENOUS | Status: AC | PRN
  Administered 2020-01-23: 8 mL via INTRAVENOUS

## 2020-01-23 NOTE — Progress Notes (Signed)
His labs are fine. Please see if you can get in touch with him and whether he plans to reschedule his MRI. He did not show up for it last night.

## 2020-01-24 ENCOUNTER — Other Ambulatory Visit: Payer: Self-pay | Admitting: Internal Medicine

## 2020-01-24 MED ORDER — METFORMIN HCL 1000 MG PO TABS: 1000.0000 mg | ORAL_TABLET | Freq: Two times a day (BID) | ORAL | 0 refills | Status: DC

## 2020-01-24 NOTE — Progress Notes (Signed)
He needs a virtual to discuss starting new medication for anxiety.

## 2020-01-25 ENCOUNTER — Encounter: Payer: Self-pay | Admitting: Family Medicine

## 2020-01-25 ENCOUNTER — Other Ambulatory Visit: Payer: Self-pay

## 2020-01-25 ENCOUNTER — Telehealth: Payer: No Typology Code available for payment source | Admitting: Family Medicine

## 2020-01-25 DIAGNOSIS — F32A Depression, unspecified: Secondary | ICD-10-CM

## 2020-01-25 DIAGNOSIS — R251 Tremor, unspecified: Secondary | ICD-10-CM | POA: Diagnosis not present

## 2020-01-25 DIAGNOSIS — R42 Dizziness and giddiness: Secondary | ICD-10-CM

## 2020-01-25 DIAGNOSIS — H9313 Tinnitus, bilateral: Secondary | ICD-10-CM

## 2020-01-25 DIAGNOSIS — F419 Anxiety disorder, unspecified: Secondary | ICD-10-CM

## 2020-01-25 DIAGNOSIS — R519 Headache, unspecified: Secondary | ICD-10-CM

## 2020-01-25 MED ORDER — BUPROPION HCL ER (SR) 100 MG PO TB12: 100.0000 mg | ORAL_TABLET | Freq: Every day | ORAL | 1 refills | Status: DC

## 2020-01-25 NOTE — Progress Notes (Signed)
   Subjective:  Documentation for virtual audio and video telecommunications through [place] encounter:  The patient was located at  work. 2 patient identifiers used.  The provider was located in the office. The patient did consent to this visit and is aware of possible charges through their insurance for this visit.  The other persons participating in this telemedicine service were none. Time spent on call was 16 minutes and in review of previous records 20 minutes total.  This virtual service is not related to other E/M service within previous 7 days.   Patient ID: Jacob Hart, male    DOB: 1966/02/04, 54 y.o.   MRN: [id number]  HPI Chief Complaint  Patient presents with  . anixety    aneixty and stressed for a couple months, headaches and dizziness   He was seen in my office on 01/22/2020 for a CPE but had several new complaints including new onset headaches, dizziness, new tremor, worsening tinnitus and worsening anxiety and depression. He is currently on Lexapro 20 mg.  He has not yet scheduled with a therapist but he plans to call tomorrow on his off day. We discussed at his visit 3 days ago adding a new medication to help with his anxiety and panic.  He would like to try.  He has heard Wellbutrin but has never taken it.  He does not recall taking any other medications for anxiety or depression. MRI brain negative.   Does not report any changes since his last visit.  He would like to see ENT for worsening tinnitus.  States he has never seen ENT for this.    Review of Systems Pertinent positives and negatives in the history of present illness.     Objective:   Physical Exam There were no vitals taken for this visit. Alert and oriented and in no acute distress.  Respirations unlabored.  Normal speech, mood and thought process.       Assessment & Plan:  Anxiety and depression - Plan: buPROPion (WELLBUTRIN SR) 100 MG 12 hr tablet  Tremor of both hands -  Plan: Ambulatory referral to Neurology  Tinnitus of both ears - Plan: Ambulatory referral to ENT  Dizziness - Plan: Ambulatory referral to Neurology  New onset of headaches after age 67 - Plan: Ambulatory referral to Neurology  Discussed adding on Wellbutrin and continuing on Lexapro.  He is in agreement to try this.  Advised that if he is having any concerning side effects or if his anxiety is worsening, he will let me know and stop the Wellbutrin. We also considered BuSpar and that would be my next plan of care if the Wellbutrin is not helpful. Encouraged him to call and schedule with a therapist and he plans to do this tomorrow. He requests a referral to ENT due to worsening tinnitus and I will put this referral in. Recent labs and MRI brain were unremarkable. New onset tremor as well as headache and dizziness are concerning.  I will refer him to neurology for an initial evaluation.

## 2020-02-14 ENCOUNTER — Other Ambulatory Visit: Payer: Self-pay | Admitting: Family Medicine

## 2020-02-14 DIAGNOSIS — E1165 Type 2 diabetes mellitus with hyperglycemia: Secondary | ICD-10-CM

## 2020-02-15 ENCOUNTER — Telehealth: Payer: Self-pay

## 2020-02-19 ENCOUNTER — Other Ambulatory Visit: Payer: Self-pay | Admitting: Family Medicine

## 2020-02-19 DIAGNOSIS — F32A Depression, unspecified: Secondary | ICD-10-CM

## 2020-02-19 DIAGNOSIS — F419 Anxiety disorder, unspecified: Secondary | ICD-10-CM

## 2020-02-19 NOTE — Telephone Encounter (Signed)
Has he started the Wellbutrin and if so, how is he doing? Let's do a follow up visit before giving a 90 day refill. Also, please check and see if he is scheduled soon to see neurology and if not when he will be scheduled. Thanks.

## 2020-02-20 NOTE — Telephone Encounter (Signed)
Left message for pt to call me back 

## 2020-02-20 NOTE — Telephone Encounter (Signed)
Pt has an appt Thursday and will address then

## 2020-02-21 NOTE — Progress Notes (Signed)
Subjective:    Patient ID: Jacob Hart, male    DOB: 1965/09/01, 54 y.o.   MRN: [id number]  Jacob Hart is a 54 y.o. male who presents for follow-up of Type 2 diabetes mellitus.  States he is doing much better on Wellbutrin.  States he also started doing the 12-step program again which he had stopped doing.  Overall his mental health has improved significantly.  VA flu shot 01/19/2020  Shingrix [facility] 01/19/2020 and due for 2nd   Patient is not checking home blood sugars.   Home blood sugar records: patient does not check sugars How often is blood sugars being checked: not checking Current symptoms include: none. Patient denies increased appetite, nausea, visual disturbances, vomiting and weight loss.  Patient is checking their feet daily. Any Foot concerns (callous, ulcer, wound, thickened nails, toenail fungus, skin fungus, hammer toe): none Last dilated eye exam: last week- [facility] [place], [place]  Current treatments: doing well on DM meds. Medication compliance: good  Current diet: in general, a "healthy" diet  Reports cutting back on sweets Current exercise: none he is active in his job Known diabetic complications: Mild retinopathy  The following portions of the patient's history were reviewed and updated as appropriate: allergies, current medications, past medical history, past social history and problem list.  ROS as in subjective above.     Objective:    Physical Exam Alert and in no distress otherwise not examined.  Blood pressure 102/68, pulse 71, weight 178 lb 6.4 oz (80.9 kg).  Lab Review Diabetic Labs Latest Ref Rng & Units 02/22/2020 01/22/2020 11/20/2019 05/04/2019 01/02/2019  HbA1c 4.0 - 5.6 % 5.9(A) - 8.8(A) 6.1(A) 7.9(A)  Microalbumin Not estab mg/dL - - - - -  Micro/Creat Ratio 0 - 29 mg/g creat - - <12 - <9  Chol 100 - 199 mg/dL - 95(L) - - 130  HDL >39 mg/dL - 27(L) - - 32(L)  Calc LDL 0 - 99 mg/dL - 53 - - 75  Triglycerides 0 -  149 mg/dL - 66 - - 126  Creatinine 0.76 - 1.27 mg/dL - 0.73(L) 0.96 - 0.92   BP/Weight 02/22/2020 01/22/2020 11/20/2019 07/05/2019 7/93/9030  Systolic BP 092 330 076 226 333  Diastolic BP 68 70 62 80 94  Wt. (Lbs) 178.4 179 185.4 178.02 -  BMI 25.6 25.68 27.38 26.29 -   Foot/eye exam completion dates Latest Ref Rng & Units 07/01/2019 01/02/2019  Eye Exam No Retinopathy No Retinopathy -  Foot Form Completion - - Done    Jacob Hart  reports that he has never smoked. His smokeless tobacco use includes snuff. He reports that he does not drink alcohol and does not use drugs.     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 Rx changes: lower Metformin dose to 500 mg bid Hgb A1c 5.9% (dropped from 8.8%) discussed reducing Jardiance or stopping Trulicity but he is doing well with these. Preferred to reduce metformin dose.  2. Education: Reviewed 'ABCs' of diabetes management (respective goals in parentheses):  A1C (<7), blood pressure (<130/80), and cholesterol (LDL <100). 3. Compliance at present is estimated to be good. Efforts to improve  compliance (if necessary) will be directed at continue low sugar and low carb diet. Keep a closer eye on blood sugars since we are reducing Metformin  4. HTN- contolled 5. Anxiety- continue Wellbutrin  6. Follow up: 5 months for diabetes and anxiety

## 2020-02-22 ENCOUNTER — Encounter: Payer: Self-pay | Admitting: Family Medicine

## 2020-02-22 ENCOUNTER — Other Ambulatory Visit: Payer: Self-pay

## 2020-02-22 ENCOUNTER — Ambulatory Visit: Payer: No Typology Code available for payment source | Admitting: Family Medicine

## 2020-02-22 VITALS — BP 102/68 | HR 71 | Wt 178.4 lb

## 2020-02-22 DIAGNOSIS — E1165 Type 2 diabetes mellitus with hyperglycemia: Secondary | ICD-10-CM

## 2020-02-22 DIAGNOSIS — F32A Depression, unspecified: Secondary | ICD-10-CM

## 2020-02-22 DIAGNOSIS — F419 Anxiety disorder, unspecified: Secondary | ICD-10-CM | POA: Diagnosis not present

## 2020-02-22 DIAGNOSIS — E133299 Other specified diabetes mellitus with mild nonproliferative diabetic retinopathy without macular edema, unspecified eye: Secondary | ICD-10-CM

## 2020-02-22 DIAGNOSIS — E1159 Type 2 diabetes mellitus with other circulatory complications: Secondary | ICD-10-CM | POA: Diagnosis not present

## 2020-02-22 DIAGNOSIS — I152 Hypertension secondary to endocrine disorders: Secondary | ICD-10-CM

## 2020-02-22 DIAGNOSIS — E118 Type 2 diabetes mellitus with unspecified complications: Secondary | ICD-10-CM | POA: Diagnosis not present

## 2020-02-22 LAB — POCT GLYCOSYLATED HEMOGLOBIN (HGB A1C): Hemoglobin A1C: 5.9 % — AB (ref 4.0–5.6)

## 2020-02-22 MED ORDER — BUPROPION HCL ER (SR) 100 MG PO TB12: 100.0000 mg | ORAL_TABLET | Freq: Every day | ORAL | 4 refills | Status: DC

## 2020-02-22 MED ORDER — METFORMIN HCL 500 MG PO TABS: 500.0000 mg | ORAL_TABLET | Freq: Two times a day (BID) | ORAL | 1 refills | Status: DC

## 2020-03-09 ENCOUNTER — Other Ambulatory Visit: Payer: Self-pay | Admitting: Family Medicine

## 2020-03-09 DIAGNOSIS — E1165 Type 2 diabetes mellitus with hyperglycemia: Secondary | ICD-10-CM

## 2020-03-10 ENCOUNTER — Other Ambulatory Visit: Payer: Self-pay | Admitting: Family Medicine

## 2020-03-13 ENCOUNTER — Other Ambulatory Visit: Payer: Self-pay

## 2020-03-13 ENCOUNTER — Encounter (INDEPENDENT_AMBULATORY_CARE_PROVIDER_SITE_OTHER): Payer: Self-pay | Admitting: Otolaryngology

## 2020-03-13 ENCOUNTER — Ambulatory Visit (INDEPENDENT_AMBULATORY_CARE_PROVIDER_SITE_OTHER): Payer: No Typology Code available for payment source | Admitting: Otolaryngology

## 2020-03-13 VITALS — Temp 96.3°F

## 2020-03-13 DIAGNOSIS — H9313 Tinnitus, bilateral: Secondary | ICD-10-CM | POA: Diagnosis not present

## 2020-03-13 NOTE — Progress Notes (Signed)
Problem TIA HPI: Jacob Hart is a 54 y.o. male who presents for evaluation of tinnitus.  He apparently has had tinnitus for several years but since this last summer it has been a little bit worse and more persistent.  He works as a Furniture conservator/restorer and uses ear buds to help with protection from loud noise which he is exposed to throughout his work.  Past Medical History:  Diagnosis Date  . Allergy   . Anxiety and depression 12/23/2016  . Controlled type 2 diabetes mellitus without complication, without long-term current use of insulin ([place]) 12/23/2016  . GERD (gastroesophageal reflux disease) 12/23/2016  . History of esophageal stricture    dilation done in 2016 at [facility]  . Hypertension associated with diabetes ([place]) 12/23/2016  . Substance abuse ([place])   . Uncontrolled diabetes mellitus type 2 without complications 2/42/6834   Past Surgical History:  Procedure Laterality Date  . ABDOMINAL SURGERY     as a child  . ESOPHAGOGASTRODUODENOSCOPY  08/02/2014   mild chronic nonspecific gastritis, stomach and endoscopic biopises. reflux esophagitis  . ESOPHAGOGASTRODUODENOSCOPY (EGD) WITH ESOPHAGEAL DILATION     mild chronic stomach gastritis per record in 2016  . NASAL SEPTUM SURGERY    . TONSILLECTOMY    . VASECTOMY     Social History   Socioeconomic History  . Marital status: Married    Spouse name: Not on file  . Number of children: Not on file  . Years of education: Not on file  . Highest education level: Not on file  Occupational History  . Not on file  Tobacco Use  . Smoking status: Never Smoker  . Smokeless tobacco: Current User    Types: Snuff  Vaping Use  . Vaping Use: Never used  Substance and Sexual Activity  . 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 Drug use: No  . Sexual activity: Yes    Partners: Female  Other Topics Concern  . Not on file  Social History Narrative   Married in 12/2016. Works as a Furniture conservator/restorer. Smokeless tobacco  x 35 years and is contemplating stopping. Enjoys fishing and hunting.    Social Determinants of Health   Financial Resource Strain:   . Difficulty of Paying Living Expenses: Not on file  Food Insecurity:   . Worried About Charity fundraiser in the Last Year: Not on file  . Ran Out of Food in the Last Year: Not on file  Transportation Needs:   . Lack of Transportation (Medical): Not on file  . Lack of Transportation (Non-Medical): Not on file  Physical Activity:   . Days of Exercise per Week: Not on file  . Minutes of Exercise per Session: Not on file  Stress:   . Feeling of Stress : Not on file  Social Connections:   . Frequency of Communication with Friends and Family: Not on file  . Frequency of Social Gatherings with Friends and Family: Not on file  . Attends Religious Services: Not on file  . Active Member of Clubs or Organizations: Not on file  . Attends Archivist Meetings: Not on file  . Marital Status: Not on file   Family History  Problem Relation Age of Onset  . Lung cancer Mother 37  . Prostate cancer Father 39  . Bladder Cancer Father   . Lung cancer Maternal Grandfather   . Colon cancer Paternal Grandmother   . CAD Brother  Allergies  Allergen Reactions  . Sulfa Antibiotics Hives   Prior to Admission medications   Medication Sig Start Date End Date Taking? Authorizing Provider  atorvastatin (LIPITOR) 10 MG tablet TAKE 1 TABLET BY MOUTH EVERY DAY 03/11/20  Yes Henson, Vickie L, NP-C  buPROPion (WELLBUTRIN SR) 100 MG 12 hr tablet Take 1 tablet (100 mg total) by mouth daily. 02/22/20  Yes Henson, Vickie L, NP-C  enalapril (VASOTEC) 5 MG tablet TAKE 1 TABLET BY MOUTH ONCE DAILY 12/18/19  Yes Henson, Vickie L, NP-C  escitalopram (LEXAPRO) 20 MG tablet TAKE 1 TABLET BY MOUTH EVERY DAY 01/08/20  Yes Henson, Vickie L, NP-C  glucose blood test strip Test once a day. Pt uses one touch verio flex meter 02/07/18  Yes Henson, Vickie L, NP-C  JARDIANCE 25 MG TABS  tablet TAKE 1 TABLET (25 MG TOTAL) BY MOUTH DAILY BEFORE BREAKFAST. 02/14/20  Yes Henson, Vickie L, NP-C  metFORMIN (GLUCOPHAGE) 500 MG tablet Take 1 tablet (500 mg total) by mouth 2 (two) times daily with a meal. 02/22/20  Yes Henson, Vickie L, NP-C  omeprazole (PRILOSEC) 40 MG capsule Take 1 capsule (40 mg total) by mouth daily. 06/02/19  Yes Thornton Park, MD  [facility] DELICA LANCETS FINE MISC Test once a day 12/23/16  Yes Henson, Vickie L, NP-C  TRULICITY 5.46 FK/8.1EX SOPN INJECT 0.5 MLS (0.75 MG TOTAL) INTO THE SKIN ONCE A WEEK. 03/11/20  Yes Henson, Vickie L, NP-C     Positive ROS: Otherwise negative  All other systems have been reviewed and were otherwise negative with the exception of those mentioned in the HPI and as above.  Physical Exam: Constitutional: Alert, well-appearing, no acute distress Ears: External ears without lesions or tenderness. Ear canals are clear bilaterally with intact, clear TMs bilaterally..  Nasal: External nose without lesions. Clear nasal passages Oral: Lips and gums without lesions. Tongue and palate mucosa without lesions. Posterior oropharynx clear. Neck: No palpable adenopathy or masses Respiratory: Breathing comfortably  Skin: No facial/neck lesions or rash noted.  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  He had type A tympanograms bilaterally.  SRT's were 15 dB bilaterally.  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  Plan: Discussed with the patient concerning using ear protection when around loud noise.  He discussed this with audiologist and they recommended custom noise protection. He will follow-up as needed.  Radene Journey, MD

## 2020-03-18 ENCOUNTER — Ambulatory Visit: Payer: No Typology Code available for payment source | Admitting: Neurology

## 2020-03-28 ENCOUNTER — Encounter: Payer: Self-pay | Admitting: Internal Medicine

## 2020-04-07 ENCOUNTER — Other Ambulatory Visit: Payer: Self-pay | Admitting: Family Medicine

## 2020-04-11 ENCOUNTER — Other Ambulatory Visit: Payer: Self-pay | Admitting: Family Medicine

## 2020-04-11 ENCOUNTER — Encounter (INDEPENDENT_AMBULATORY_CARE_PROVIDER_SITE_OTHER): Payer: Self-pay

## 2020-04-11 DIAGNOSIS — E1165 Type 2 diabetes mellitus with hyperglycemia: Secondary | ICD-10-CM

## 2020-04-16 ENCOUNTER — Other Ambulatory Visit: Payer: Self-pay | Admitting: Family Medicine

## 2020-05-08 ENCOUNTER — Ambulatory Visit: Payer: No Typology Code available for payment source | Admitting: Neurology

## 2020-05-13 ENCOUNTER — Other Ambulatory Visit: Payer: Self-pay | Admitting: Family Medicine

## 2020-05-13 DIAGNOSIS — I152 Hypertension secondary to endocrine disorders: Secondary | ICD-10-CM

## 2020-05-13 DIAGNOSIS — E1159 Type 2 diabetes mellitus with other circulatory complications: Secondary | ICD-10-CM

## 2020-05-16 NOTE — Telephone Encounter (Signed)
Made in error

## 2020-05-20 ENCOUNTER — Other Ambulatory Visit: Payer: Self-pay

## 2020-05-20 ENCOUNTER — Telehealth (INDEPENDENT_AMBULATORY_CARE_PROVIDER_SITE_OTHER): Payer: No Typology Code available for payment source | Admitting: Family Medicine

## 2020-05-20 ENCOUNTER — Encounter: Payer: Self-pay | Admitting: Family Medicine

## 2020-05-20 VITALS — Temp 98.0°F | Ht 70.0 in | Wt 185.0 lb

## 2020-05-20 DIAGNOSIS — J069 Acute upper respiratory infection, unspecified: Secondary | ICD-10-CM

## 2020-05-20 DIAGNOSIS — R059 Cough, unspecified: Secondary | ICD-10-CM | POA: Diagnosis not present

## 2020-05-20 MED ORDER — BENZONATATE 200 MG PO CAPS: 200.0000 mg | ORAL_CAPSULE | Freq: Three times a day (TID) | ORAL | 0 refills | Status: DC | PRN

## 2020-05-20 NOTE — Progress Notes (Signed)
Start time: 3:14 End time: 3:30  Virtual Visit via Video Note  I connected with Jacob Hart on 05/20/20 by a video enabled telemedicine application and verified that I am speaking with the correct person using two identifiers.  Location: Patient: home Provider: office   I discussed the limitations of evaluation and management by telemedicine and the availability of in person appointments. The patient expressed understanding and agreed to proceed.  History of Present Illness:  Chief Complaint  Patient presents with  . sick    Sick since last Wednesday. Covid rapid testing Saturday- negative. Headache and horrible cough.    Patient complains of 5 days of upper respiratory complaints--nose burns, head hurts, coughs a lot, worse when laying down. Cough is productive of thick, clear phlegm. Hurts in head, shoulders, necks--some from coughing, some from body aches. No fevers. 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 [facility]--[facility], flu tests negative per pt. (no results available in [place])  PMH, [place]. SH reviewed Well controlled DM. Nonsmoker Lab Results  Component Value Date   HGBA1C 5.9 (A) 02/22/2020   OTC meds taken for illness: Ibuprofen for body aches 12 hour dextromethorphan syrup, not helping. Using Neti-pot, clear mucus  Outpatient Encounter Medications as of 05/20/2020  Medication Sig  . atorvastatin (LIPITOR) 10 MG tablet TAKE 1 TABLET BY MOUTH EVERY DAY  . buPROPion (WELLBUTRIN SR) 100 MG 12 hr tablet Take 1 tablet (100 mg total) by mouth daily.  . enalapril (VASOTEC) 5 MG tablet TAKE 1 TABLET BY MOUTH ONCE DAILY  . escitalopram (LEXAPRO) 20 MG tablet TAKE 1 TABLET BY MOUTH EVERY DAY  . glucose blood test strip Test once a day. Pt uses one touch verio flex meter  . JARDIANCE 25 MG TABS tablet TAKE 1 TABLET (25 MG TOTAL) BY MOUTH DAILY BEFORE BREAKFAST.   . metFORMIN (GLUCOPHAGE) 500 MG tablet Take 1 tablet (500 mg total) by mouth 2 (two) times daily with a meal.  . omeprazole (PRILOSEC) 40 MG capsule Take 1 capsule (40 mg total) by mouth daily.  [facility] DELICA LANCETS FINE MISC Test once a day  . TRULICITY 5.88 FO/2.7XA SOPN INJECT 0.5 MLS (0.75 MG TOTAL) INTO THE SKIN ONCE A WEEK.   No facility-administered encounter medications on file as of 05/20/2020.   Allergies  Allergen Reactions  . Sulfa Antibiotics Hives   ROS: no fever, chills, chest pain, shortness of breath, n/v/d, rash, bleeding. See HPI      Observations/Objective:  Temp 35 F (36.7 C)   Ht 5\' 10"  (1.778 m)   Wt 185 lb (83.9 kg)   BMI 26.54 kg/m   Well-appearing male, in no distress. He is alert, oriented. He is not coughing during visit. He is speaking easily, in no distress Cranial nerves grossly intact. Exam is limited due to virtual nature of the visit.   Assessment and Plan:  Viral URI - per pt, negative COVID PCR and flu tests.  Conservative measures reviewed, and s/sx secondary bacterial infections.  Cough - Plan: benzonatate (TESSALON) 200 MG capsule   Drink plenty of water Take antihistamine daily (claritin)--this may help dry up some of the runny nose and postnasal drainage that may be contributing to your cough. Take Guafeneisn (12 hour Mucinex, PLAIN) in addition to the 12 hour cough syrup you have at home, OR you can stop the syrup you have, and instead  get the 12 hour Mucinex DM, and take that twice daily. I sent in a prescription for Tessalon perles (benzonatate), which is a different kind of cough medication, to use if needed.  Please contact us if you symptoms change or worsen--any fever, pain with breathing, shortness of breath, sinus pain, discolored mucus or phlegm, or other new symptoms.  I hope you feel better soon!    Follow Up Instructions:    I discussed the assessment and treatment plan with the patient. The patient was  provided an opportunity to ask questions and all were answered. The patient agreed with the plan and demonstrated an understanding of the instructions.   The patient was advised to call back or seek an in-person evaluation if the symptoms worsen or if the condition fails to improve as anticipated.  I spent 20 minutes dedicated to the care of this patient, including pre-visit review of records, face to face time, post-visit ordering of testing and documentation.   Vikki Ports, MD

## 2020-05-20 NOTE — Patient Instructions (Signed)
  Drink plenty of water Take antihistamine daily (claritin)--this may help dry up some of the runny nose and postnasal drainage that may be contributing to your cough. Take Guafeneisn (12 hour Mucinex, PLAIN) in addition to the 12 hour cough syrup you have at home, OR you can stop the syrup you have, and instead get the 12 hour Mucinex DM, and take that twice daily. I sent in a prescription for Tessalon perles (benzonatate), which is a different kind of cough medication, to use if needed.  Please contact us if you symptoms change or worsen--any fever, pain with breathing, shortness of breath, sinus pain, discolored mucus or phlegm, or other new symptoms.  I hope you feel better soon!

## 2020-05-30 ENCOUNTER — Telehealth: Payer: Self-pay | Admitting: Internal Medicine

## 2020-05-30 NOTE — Telephone Encounter (Signed)
Pt called and states that he is only getting his meds through [facility] as it is cheaper. He is not sure about the dose change or why but he will try to figure out and let us know. He will also let us know how we need to refill his meds from now on since [facility] as [facility] will [place] [facility] in [place] , [place]. His current med list was sent to them so it had 5mg  on it and not 20mg 

## 2020-05-30 NOTE — Telephone Encounter (Signed)
Got notice from [facility] that we refilled enalapril 5mg  on 05/26/2020 and Enalapril 20mg  was filled by Valentina Shaggy on 05/23/20 at [facility]. Just trying to figure out if he is seeing [place] or someone else now

## 2020-06-03 ENCOUNTER — Telehealth: Payer: Self-pay

## 2020-06-03 ENCOUNTER — Other Ambulatory Visit: Payer: Self-pay

## 2020-06-03 DIAGNOSIS — E1159 Type 2 diabetes mellitus with other circulatory complications: Secondary | ICD-10-CM

## 2020-06-03 DIAGNOSIS — I152 Hypertension secondary to endocrine disorders: Secondary | ICD-10-CM

## 2020-06-03 MED ORDER — ENALAPRIL MALEATE 5 MG PO TABS: 5.0000 mg | ORAL_TABLET | Freq: Every day | ORAL | 0 refills | Status: DC

## 2020-06-03 NOTE — Telephone Encounter (Signed)
Done changed to the [place]

## 2020-06-03 NOTE — Telephone Encounter (Signed)
Pt called and needs refill Enalapril 5mg  to [place] in [place] fax t# [phone] everything needs to go to [place] except for [facility]  Enalapril is the only thing he needs refilled at this time and it is the 5mg 

## 2020-06-08 NOTE — Telephone Encounter (Signed)
done

## 2020-07-02 ENCOUNTER — Other Ambulatory Visit: Payer: Self-pay | Admitting: Family Medicine

## 2020-07-02 DIAGNOSIS — E1165 Type 2 diabetes mellitus with hyperglycemia: Secondary | ICD-10-CM

## 2020-07-04 ENCOUNTER — Other Ambulatory Visit: Payer: Self-pay | Admitting: Family Medicine

## 2020-07-04 NOTE — Telephone Encounter (Signed)
Pt has an appt on 4/20

## 2020-07-23 NOTE — Progress Notes (Signed)
  Subjective:    Patient ID: Jacob Hart, male    DOB: 04-21-1965, 55 y.o.   MRN: [id number]  Jacob Hart is a 55 y.o. male who presents for follow-up of Type 2 diabetes mellitus.  Anxiety and depression- happy with his medications.   HTN- doing well on medication.   Taking statin daily   Patient is checking home blood sugars.   Home blood sugar records: BGs range between 120 and 150 How often is blood sugars being checked: couple times a week Current symptoms include: none. Patient denies increased appetite, nausea, visual disturbances, vomiting and weight loss.  Patient is checking their feet daily. Any Foot concerns (callous, ulcer, wound, thickened nails, toenail fungus, skin fungus, hammer toe): none Last dilated eye exam: year ago  Current treatments: doing well on dm meds. Medication compliance: excellent  Current diet: in general, a "healthy" diet   Current exercise: none Known diabetic complications: none  The following portions of the patient's history were reviewed and updated as appropriate: allergies, current medications, past medical history, past social history and problem list.  ROS as in subjective above.     Objective:    Physical Exam Alert and in no distress otherwise not examined.  Blood pressure 110/70, pulse 78, weight 183 lb 3.2 oz (83.1 kg).  Lab Review Diabetic Labs Latest Ref Rng & Units 07/24/2020 02/22/2020 01/22/2020 11/20/2019 05/04/2019  HbA1c 4.0 - 5.6 % 6.2(A) 5.9(A) - 8.8(A) 6.1(A)  Microalbumin Not estab mg/dL - - - - -  Micro/Creat Ratio 0 - 29 mg/g creat - - - <12 -  Chol 100 - 199 mg/dL - - 95(L) - -  HDL >39 mg/dL - - 27(L) - -  Calc LDL 0 - 99 mg/dL - - 53 - -  Triglycerides 0 - 149 mg/dL - - 66 - -  Creatinine 0.76 - 1.27 mg/dL - - 0.73(L) 0.96 -   BP/Weight 07/24/2020 05/20/2020 02/22/2020 01/22/2020 0/35/0093  Systolic BP 818 - 299 371 696  Diastolic BP 70 - 68 70 62  Wt. (Lbs) 183.2 185 178.4 179 185.4  BMI  26.29 26.54 25.6 25.68 27.38   Foot/eye exam completion dates Latest Ref Rng & Units 07/01/2019 01/02/2019  Eye Exam No Retinopathy No Retinopathy -  Foot Form Completion - - Done    Jacob Hart  reports that he has never smoked. His smokeless tobacco use includes snuff. He reports that he does not drink alcohol and does not use drugs.     Assessment & Plan:    Controlled type 2 diabetes mellitus with mild nonproliferative retinopathy, macular edema presence unspecified, unspecified laterality, unspecified whether long term insulin use ([place]) - Plan: HgB A1c  Hypertension associated with diabetes ([place])  Anxiety and depression  1. Rx changes: none Hgb A1c 6.2%  2. Education: Reviewed 'ABCs' of diabetes management (respective goals in parentheses):  A1C (<7), blood pressure (<130/80), and cholesterol (LDL <100). 3. Continue statin therapy.  4. HTN- controlled. Continue enalapril  5. His mood is good. Continue on Wellbutrin and Lexapro  6. Compliance at present is estimated to be good. Efforts to improve compliance (if necessary) will be directed at dietary modifications: limit sugar and carbohydrates, increased exercise and regular blood sugar monitoring: daily. 7. Follow up: 6 months

## 2020-07-24 ENCOUNTER — Ambulatory Visit: Payer: No Typology Code available for payment source | Admitting: Family Medicine

## 2020-07-24 ENCOUNTER — Other Ambulatory Visit: Payer: Self-pay

## 2020-07-24 ENCOUNTER — Encounter: Payer: Self-pay | Admitting: Family Medicine

## 2020-07-24 VITALS — BP 110/70 | HR 78 | Wt 183.2 lb

## 2020-07-24 DIAGNOSIS — I152 Hypertension secondary to endocrine disorders: Secondary | ICD-10-CM | POA: Diagnosis not present

## 2020-07-24 DIAGNOSIS — F419 Anxiety disorder, unspecified: Secondary | ICD-10-CM | POA: Diagnosis not present

## 2020-07-24 DIAGNOSIS — E1159 Type 2 diabetes mellitus with other circulatory complications: Secondary | ICD-10-CM

## 2020-07-24 DIAGNOSIS — F32A Depression, unspecified: Secondary | ICD-10-CM

## 2020-07-24 DIAGNOSIS — E113299 Type 2 diabetes mellitus with mild nonproliferative diabetic retinopathy without macular edema, unspecified eye: Secondary | ICD-10-CM | POA: Diagnosis not present

## 2020-07-24 LAB — POCT GLYCOSYLATED HEMOGLOBIN (HGB A1C): Hemoglobin A1C: 6.2 % — AB (ref 4.0–5.6)

## 2020-08-14 ENCOUNTER — Other Ambulatory Visit: Payer: Self-pay | Admitting: Gastroenterology

## 2020-08-15 ENCOUNTER — Telehealth: Payer: Self-pay | Admitting: Internal Medicine

## 2020-08-15 DIAGNOSIS — E1165 Type 2 diabetes mellitus with hyperglycemia: Secondary | ICD-10-CM

## 2020-08-15 DIAGNOSIS — I152 Hypertension secondary to endocrine disorders: Secondary | ICD-10-CM

## 2020-08-15 DIAGNOSIS — E1159 Type 2 diabetes mellitus with other circulatory complications: Secondary | ICD-10-CM

## 2020-08-15 MED ORDER — TRULICITY 0.75 MG/0.5ML ~~LOC~~ SOAJ: 0.7500 mg | SUBCUTANEOUS | 0 refills | Status: DC

## 2020-08-15 MED ORDER — ENALAPRIL MALEATE 5 MG PO TABS: 5.0000 mg | ORAL_TABLET | Freq: Every day | ORAL | 0 refills | Status: DC

## 2020-08-15 NOTE — Telephone Encounter (Signed)
Pt requested a refill for enalapril and trulicity

## 2020-11-11 ENCOUNTER — Other Ambulatory Visit: Payer: Self-pay | Admitting: Family Medicine

## 2020-11-11 DIAGNOSIS — I152 Hypertension secondary to endocrine disorders: Secondary | ICD-10-CM

## 2020-11-11 DIAGNOSIS — E1159 Type 2 diabetes mellitus with other circulatory complications: Secondary | ICD-10-CM

## 2020-11-13 ENCOUNTER — Encounter: Payer: Self-pay | Admitting: Internal Medicine

## 2020-11-14 ENCOUNTER — Other Ambulatory Visit: Payer: Self-pay | Admitting: Family Medicine

## 2020-11-14 DIAGNOSIS — E1165 Type 2 diabetes mellitus with hyperglycemia: Secondary | ICD-10-CM

## 2020-12-02 NOTE — Progress Notes (Signed)
[facility] Note  12/05/2020     CHIEF COMPLAINT Patient presents for Retina Follow Up   HISTORY OF PRESENT ILLNESS: Jacob Hart is a 55 y.o. male who presents to the clinic today for:   HPI     Retina Follow Up   Patient presents with  Diabetic Retinopathy.  In both eyes.  Duration of 2.7 years.  Since onset it is gradually worsening.  I, the attending physician,  performed the HPI with the patient and updated documentation appropriately.        Comments   Pt here for a 2 year 7 mo ret f/u, lost to 33mo f/u in Feb 2020. Pt states hes doing well. Does reports some slow, gradual vision worsening due to age OU. Reports blood sugar has been well controlled, reading this AM was 115. Last A1C 6.5.       Last edited by Jacob Caffey, MD on 12/05/2020 12:40 PM.    Pt states his near vision is getting blurry, pts BG was 115 this morning   Referring physician: Girtha Rm, NP-C [place],  [place] [id number]  HISTORICAL INFORMATION:   Selected notes from the MEDICAL RECORD NUMBER Referred by Jacob Dingwall, NP-C for DM exam LEE:  Ocular Hx- PMH-DM (last A1C: 7.0, taking jardiance, metformin) HTN, anxiety, depression    CURRENT MEDICATIONS: No current outpatient medications on file. (Ophthalmic Drugs)   No current facility-administered medications for this visit. (Ophthalmic Drugs)   Current Outpatient Medications (Other)  Medication Sig   atorvastatin (LIPITOR) 10 MG tablet TAKE 1 TABLET BY MOUTH EVERY DAY   buPROPion (WELLBUTRIN SR) 100 MG 12 hr tablet Take 1 tablet (100 mg total) by mouth daily.   enalapril (VASOTEC) 5 MG tablet TAKE 1 TABLET (5 MG TOTAL) BY MOUTH DAILY.   escitalopram (LEXAPRO) 20 MG tablet TAKE 1 TABLET BY MOUTH EVERY DAY   glucose blood test strip Test once a day. Pt uses one touch verio flex meter   JARDIANCE 25 MG TABS tablet TAKE 1 TABLET (25 MG TOTAL) BY MOUTH DAILY BEFORE BREAKFAST.    metFORMIN (GLUCOPHAGE) 500 MG tablet Take 1 tablet (500 mg total) by mouth 2 (two) times daily with a meal.   omeprazole (PRILOSEC) 40 MG capsule Take 1 capsule (40 mg total) by mouth daily.   ONETOUCH DELICA LANCETS FINE MISC Test once a day   TRULICITY 1.22 QM/2.5OI SOPN INJECT 0.5 MLS (0.75 MG TOTAL) INTO THE SKIN ONCE A WEEK.   No current facility-administered medications for this visit. (Other)    REVIEW OF SYSTEMS: ROS   Positive for: Endocrine, Eyes Negative for: Constitutional, Gastrointestinal, Neurological, Skin, Genitourinary, Musculoskeletal, HENT, Cardiovascular, Respiratory, Psychiatric, Allergic/Imm, Heme/Lymph Last edited by Kingsley Spittle, COT on 12/05/2020  8:20 AM.     ALLERGIES Allergies  Allergen Reactions   Sulfa Antibiotics Hives    PAST MEDICAL HISTORY Past Medical History:  Diagnosis Date   Allergy    Anxiety and depression 12/23/2016   Controlled type 2 diabetes mellitus without complication, without long-term current use of insulin (Jacob Hart) 12/23/2016   GERD (gastroesophageal reflux disease) 12/23/2016   History of esophageal stricture    dilation done in 2016 at [facility]   Hypertension associated with diabetes ([place]) 12/23/2016   Substance abuse ([place])    Uncontrolled diabetes mellitus type 2 without complications 3/70/4888   Past Surgical History:  Procedure Laterality Date   ABDOMINAL SURGERY  as a child   ESOPHAGOGASTRODUODENOSCOPY  08/02/2014   mild chronic nonspecific gastritis, stomach and endoscopic biopises. reflux esophagitis   ESOPHAGOGASTRODUODENOSCOPY (EGD) WITH ESOPHAGEAL DILATION     mild chronic stomach gastritis per record in 2016   NASAL SEPTUM SURGERY     TONSILLECTOMY     VASECTOMY      FAMILY HISTORY Family History  Problem Relation Age of Onset   Lung cancer Mother 6   Prostate cancer Father 70   Bladder Cancer Father    Lung cancer Maternal Grandfather    Colon cancer Paternal Grandmother    CAD Brother      SOCIAL HISTORY Social History   Tobacco Use   Smoking status: Never   Smokeless tobacco: Current    Types: Snuff  Vaping Use   Vaping Use: Never used  Substance Use Topics   Alcohol use: No    Comment: recovering alcoholic, 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 Central Foveal Thickness: 319. Progression has been stable. Findings include normal foveal contour, no IRF, no SRF, vitreomacular adhesion (No DME).   Left Eye Quality was good. Central Foveal Thickness: 339. Progression has worsened. Findings include no SRF, intraretinal fluid, abnormal foveal contour, vitreomacular adhesion , intraretinal hyper-reflective material (Interval development of focal cystic changes temporal fovea).   Notes *Images captured and stored on drive  Diagnosis / Impression:  OD: no DME OS: Interval development of focal cystic changes temporal fovea  Clinical management:  See below  Abbreviations: NFP - Normal foveal profile. CME - cystoid macular edema. PED - pigment epithelial detachment. IRF - intraretinal fluid. SRF - subretinal fluid. EZ - ellipsoid zone. ERM - epiretinal membrane. ORA - outer retinal atrophy. ORT - outer retinal tubulation. SRHM - subretinal hyper-reflective material             ASSESSMENT/PLAN:    ICD-10-CM   1. Mild nonproliferative diabetic retinopathy of both eyes without macular edema associated with type 2 diabetes mellitus ([place])  K02.5427     2. Retinal edema  H35.81 OCT, Retina - OU - Both Eyes    3. Essential hypertension  I10     4. Hypertensive retinopathy of both eyes  H35.033     5. Combined forms of age-related cataract of both eyes  H25.813       1,2. Mild Non-proliferative diabetic retinopathy OU  - OS now with DME  - A1c: 6.2 on 04/20.22 - exam shows scattered MA OU; no NV - BCVA remains 20/20  OU - OCT shows: OD: no DME; OS: Interval development of focal cystic changes temporal fovea - f/u in 2-3 months, DFE, OCT, FA (transit OS)  3,4. Hypertensive retinopathy OU - discussed importance of tight BP control - monitor  5. Combined form age-related cataract OU - The symptoms of cataract, surgical options, and treatments and risks were discussed with patient. - discussed diagnosis and progression - not yet visually significant - monitor for now  Ophthalmic Meds Ordered this visit:  No orders of the defined types were placed in this encounter.     Return for f/u 2-3 months, NPDR OU, DFE, OCT, FA.  There are no Patient Instructions on file for this visit.   Explained the diagnoses, plan, and follow up with the  patient and they expressed understanding.  Patient expressed understanding of the importance of proper follow up care.   This document serves as a record of services personally performed by Gardiner Sleeper, MD, PhD. It was created on their behalf by San Jetty. Owens Shark, OA an ophthalmic technician. The creation of this record is the provider's dictation and/or activities during the visit.    Electronically signed by: San Jetty. Marguerita Merles 08.29.2022 12:43 PM  Gardiner Sleeper, M.D., Ph.D. Diseases & Surgery of the Retina and Vitreous [facility]  I have reviewed the above documentation for accuracy and completeness, and I agree with the above. Gardiner Sleeper, M.D., Ph.D. 12/05/20 12:43 PM   Abbreviations: M myopia (nearsighted); A astigmatism; H hyperopia (farsighted); P presbyopia; Mrx spectacle prescription;  CTL contact lenses; OD right eye; OS left eye; OU both eyes  XT exotropia; ET esotropia; PEK punctate epithelial keratitis; PEE punctate epithelial erosions; DES dry eye syndrome; MGD meibomian gland dysfunction; ATs artificial tears; PFAT's preservative free artificial tears; [place] nuclear sclerotic cataract; PSC posterior subcapsular cataract;  ERM epi-retinal membrane; PVD posterior vitreous detachment; RD retinal detachment; DM diabetes mellitus; DR diabetic retinopathy; NPDR non-proliferative diabetic retinopathy; PDR proliferative diabetic retinopathy; CSME clinically significant macular edema; DME diabetic macular edema; dbh dot blot hemorrhages; CWS cotton wool spot; POAG primary open angle glaucoma; C/D cup-to-disc ratio; HVF humphrey visual field; GVF goldmann visual field; OCT optical coherence tomography; IOP intraocular pressure; BRVO Branch retinal vein occlusion; CRVO central retinal vein occlusion; CRAO central retinal artery occlusion; BRAO branch retinal artery occlusion; RT retinal tear; SB scleral buckle; PPV pars plana vitrectomy; VH Vitreous hemorrhage; PRP panretinal laser photocoagulation; IVK intravitreal kenalog; VMT vitreomacular traction; MH Macular hole;  NVD neovascularization of the disc; NVE neovascularization elsewhere; AREDS age related eye disease study; ARMD age related macular degeneration; POAG primary open angle glaucoma; EBMD epithelial/anterior basement membrane dystrophy; ACIOL anterior chamber intraocular lens; IOL intraocular lens; PCIOL posterior chamber intraocular lens; Phaco/IOL phacoemulsification with intraocular lens placement; [place] photorefractive keratectomy; LASIK laser assisted in situ keratomileusis; HTN hypertension; DM diabetes mellitus; COPD chronic obstructive pulmonary disease

## 2020-12-05 ENCOUNTER — Ambulatory Visit (INDEPENDENT_AMBULATORY_CARE_PROVIDER_SITE_OTHER): Payer: No Typology Code available for payment source | Admitting: Ophthalmology

## 2020-12-05 ENCOUNTER — Other Ambulatory Visit: Payer: Self-pay

## 2020-12-05 ENCOUNTER — Encounter (INDEPENDENT_AMBULATORY_CARE_PROVIDER_SITE_OTHER): Payer: Self-pay | Admitting: Ophthalmology

## 2020-12-05 DIAGNOSIS — E113293 Type 2 diabetes mellitus with mild nonproliferative diabetic retinopathy without macular edema, bilateral: Secondary | ICD-10-CM

## 2020-12-05 DIAGNOSIS — I10 Essential (primary) hypertension: Secondary | ICD-10-CM | POA: Diagnosis not present

## 2020-12-05 DIAGNOSIS — H25813 Combined forms of age-related cataract, bilateral: Secondary | ICD-10-CM

## 2020-12-05 DIAGNOSIS — H3581 Retinal edema: Secondary | ICD-10-CM | POA: Diagnosis not present

## 2020-12-05 DIAGNOSIS — H35033 Hypertensive retinopathy, bilateral: Secondary | ICD-10-CM

## 2020-12-13 ENCOUNTER — Ambulatory Visit: Payer: Self-pay

## 2020-12-13 ENCOUNTER — Encounter: Payer: Self-pay | Admitting: Emergency Medicine

## 2020-12-13 ENCOUNTER — Ambulatory Visit
Admission: EM | Admit: 2020-12-13 | Discharge: 2020-12-13 | Disposition: A | Discharge status: Home / Self Care | Payer: No Typology Code available for payment source | Attending: Emergency Medicine | Admitting: Emergency Medicine

## 2020-12-13 ENCOUNTER — Other Ambulatory Visit: Payer: Self-pay

## 2020-12-13 DIAGNOSIS — H5789 Other specified disorders of eye and adnexa: Secondary | ICD-10-CM

## 2020-12-13 DIAGNOSIS — H1131 Conjunctival hemorrhage, right eye: Secondary | ICD-10-CM

## 2020-12-13 LAB — HEMOGLOBIN A1C: Hemoglobin A1C: 7.2

## 2020-12-13 LAB — CBC AND DIFFERENTIAL
HCT: 54 — AB (ref 41–53)
Hemoglobin: 18.2 — AB (ref 13.5–17.5)
Platelets: 242 (ref 150–399)
WBC: 7.3

## 2020-12-13 LAB — CBC: RBC: 5.76 — AB (ref 3.87–5.11)

## 2020-12-13 MED ORDER — ERYTHROMYCIN 5 MG/GM OP OINT: TOPICAL_OINTMENT | OPHTHALMIC | 0 refills | Status: DC

## 2020-12-13 NOTE — Discharge Instructions (Signed)
Perform warm compresses at home.  Soak a wash cloth in warm (not scalding) water and place it over the eyes. As the wash cloth cools, it should be rewarmed and replaced for a total of 5 to 10 minutes of soaking time. Warm compresses should be applied two to four times a day as long as the patient has symptoms Perform lid washing: Either warm water or very dilute baby shampoo can be placed on a clean wash cloth, gauze pad, or cotton swab. Then be advised to gently clean along the lashes and lid margin to remove the accumulated material with care to avoid contacting the ocular surface. If shampoo is used, thorough rinsing is recommended. Vigorous washing should be avoided, as it may cause more irritation.  Prescribed erythromycin ointment.  Apply up to 6 times daily for 5-7 days, or until symptomatic improvement Follow up with ophthalmology for further evaluation and management if symptoms persists Return or go to ER if you have any new or worsening symptoms such as fever, chills, redness, swelling, eye pain, painful eye movements, vision changes, etc..Marland Kitchen

## 2020-12-13 NOTE — ED Triage Notes (Signed)
Eyes burning and crusty x 1 week.  Blood vessel popped in right eye this morning

## 2020-12-13 NOTE — ED Provider Notes (Signed)
[place]   [id number] 12/13/20 Arrival Time: 1732  CC: Red eye  SUBJECTIVE:  Jacob Hart is a 55 y.o. male who presents with complaint of RT red eye x 1 day, and matting of eyes x few days.  Denies a precipitating event, trauma, or close contacts with similar symptoms.  Has NOT tried OTC eye drops.  Denies aggravating factors.  Denies similar symptoms in the past.  Denies fever, chills, nausea, vomiting, eye pain, painful eye movements, itching, vision changes, periorbital erythema.     Denies contact lens use.    ROS: As per HPI.  All other pertinent ROS negative.     Past Medical History:  Diagnosis Date   Allergy    Anxiety and depression 12/23/2016   Controlled type 2 diabetes mellitus without complication, without long-term current use of insulin ([place]) 12/23/2016   GERD (gastroesophageal reflux disease) 12/23/2016   History of esophageal stricture    dilation done in 2016 at [facility]   Hypertension associated with diabetes ([place]) 12/23/2016   Substance abuse ([place])    Uncontrolled diabetes mellitus type 2 without complications [id number]   Past Surgical History:  Procedure Laterality Date   ABDOMINAL SURGERY     as a child   ESOPHAGOGASTRODUODENOSCOPY  08/02/2014   mild chronic nonspecific gastritis, stomach and endoscopic biopises. reflux esophagitis   ESOPHAGOGASTRODUODENOSCOPY (EGD) WITH ESOPHAGEAL DILATION     mild chronic stomach gastritis per record in 2016   NASAL SEPTUM SURGERY     TONSILLECTOMY     VASECTOMY     Allergies  Allergen Reactions   Sulfa Antibiotics Hives   No current facility-administered medications on file prior to encounter.   Current Outpatient Medications on File Prior to Encounter  Medication Sig Dispense Refill   atorvastatin (LIPITOR) 10 MG tablet TAKE 1 TABLET BY MOUTH EVERY DAY 30 tablet 5   buPROPion (WELLBUTRIN SR) 100 MG 12 hr tablet Take 1 tablet (100 mg total) by mouth daily. 30 tablet 4   enalapril (VASOTEC)  5 MG tablet TAKE 1 TABLET (5 MG TOTAL) BY MOUTH DAILY. 90 tablet 0   escitalopram (LEXAPRO) 20 MG tablet TAKE 1 TABLET BY MOUTH EVERY DAY 90 tablet 0   glucose blood test strip Test once a day. Pt uses one touch verio flex meter 100 each 2   JARDIANCE 25 MG TABS tablet TAKE 1 TABLET (25 MG TOTAL) BY MOUTH DAILY BEFORE BREAKFAST. 30 tablet 2   metFORMIN (GLUCOPHAGE) 500 MG tablet Take 1 tablet (500 mg total) by mouth 2 (two) times daily with a meal. 180 tablet 1   omeprazole (PRILOSEC) 40 MG capsule Take 1 capsule (40 mg total) by mouth daily. 90 capsule 3   ONETOUCH DELICA LANCETS FINE MISC Test once a day [id number] each 1   TRULICITY [id number] SOPN INJECT 0.5 MLS (0.75 MG TOTAL) INTO THE SKIN ONCE A WEEK. 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 Works as a Furniture conservator/restorer. Smokeless tobacco x 35 years and is contemplating stopping. Enjoys fishing and hunting.    Social Determinants of Health   Financial Resource Strain: Not on file  Food Insecurity: Not on file  Transportation Needs: Not on file  Physical Activity: Not on file  Stress: Not on file  Social Connections: Not on file  Intimate Partner Violence: Not on file   Family History  Problem Relation Age of Onset   Lung cancer Mother 23   Prostate cancer Father 32   Bladder Cancer Father    Lung cancer Maternal Grandfather    Colon cancer Paternal Grandmother    CAD  Brother     OBJECTIVE:  Vitals:   12/13/20 1746  BP: (!) 142/80  Pulse: 80  Resp: 16  Temp: 98.3 F (36.8 C)  TempSrc: Oral  SpO2: 97%    General appearance: alert; no distress Eyes: RT sided subconjunctival hemorrhage. PERRL; EOMI without discomfort; no obvious drainage Neck: supple Lungs: normal respiratory effort Skin: warm and dry Psychological: alert and cooperative; normal mood and affect  ASSESSMENT & PLAN:  1. Eye drainage   2. Subconjunctival hemorrhage of right eye     Meds ordered this encounter  Medications   erythromycin ophthalmic ointment    Sig: Place a 1/2 inch ribbon of ointment into the lower eyelid.    Dispense:  3.5 g    Refill:  0    Order Specific Question:   Supervising Provider    Answer:   Raylene Everts [id number]   Perform warm compresses at home.  Soak a wash cloth in warm (not scalding) water and place it over the eyes. As the wash cloth cools, it should be rewarmed and replaced for a total of 5 to 10 minutes of soaking time. Warm compresses should be applied two to four times a day as long as the patient has symptoms Perform lid washing: Either warm water or very dilute baby shampoo can be placed on a clean wash cloth, gauze pad, or cotton swab. Then be advised to gently clean along the lashes and lid margin to remove the accumulated material with care to avoid contacting the ocular surface. If shampoo is used, thorough rinsing is recommended. Vigorous washing should be avoided, as it may cause more irritation.  Prescribed erythromycin ointment.  Apply up to 6 times daily for 5-7 days, or until symptomatic improvement Follow up with ophthalmology for further evaluation and management if symptoms persists Return or go to ER if you have any new or worsening symptoms such as fever, chills, redness, swelling, eye pain, painful eye movements, vision changes, etc...  Reviewed expectations re: course of current medical issues. Questions  answered. Outlined signs and symptoms indicating need for more acute intervention. Patient verbalized understanding. After Visit Summary given.    Lestine Box, PA-C 12/13/20 1820

## 2020-12-29 NOTE — Patient Instructions (Addendum)
I recommend you start taking a ONE A DAY multi-vitamin which will have the vitamin D you need.   Preventive Care 84-54 Years Old, Male Preventive care refers to lifestyle choices and visits with your health care provider that can promote health and wellness. This includes: A yearly physical exam. This is also called an annual wellness visit. Regular dental and eye exams. Immunizations. Screening for certain conditions. Healthy lifestyle choices, such as: Eating a healthy diet. Getting regular exercise. Not using drugs or products that contain nicotine and tobacco. Limiting alcohol use. What can I expect for my preventive care visit? Physical exam Your health care provider will check your: Height and weight. These may be used to calculate your BMI (body mass index). BMI is a measurement that tells if you are at a healthy weight. Heart rate and blood pressure. Body temperature. Skin for abnormal spots. Counseling Your health care provider may ask you questions about your: Past medical problems. Family's medical history. Alcohol, tobacco, and drug use. Emotional well-being. Home life and relationship well-being. Sexual activity. Diet, exercise, and sleep habits. Work and work Statistician. Access to firearms. What immunizations do I need? Vaccines are usually given at various ages, according to a schedule. Your health care provider will recommend vaccines for you based on your age, medical history, and lifestyle or other factors, such as travel or where you work. What tests do I need? Blood tests Lipid and cholesterol levels. These may be checked every 5 years, or more often if you are over 6 years old. Hepatitis C test. Hepatitis B test. Screening Lung cancer screening. You may have this screening every year starting at age 41 if you have a 30-pack-year history of smoking and currently smoke or have quit within the past 15 years. Prostate cancer screening. Recommendations will  vary depending on your family history and other risks. Genital exam to check for testicular cancer or hernias. Colorectal cancer screening. All adults should have this screening starting at age 14 and continuing until age 67. Your health care provider may recommend screening at age 25 if you are at increased risk. You will have tests every 1-10 years, depending on your results and the type of screening test. Diabetes screening. This is done by checking your blood sugar (glucose) after you have not eaten for a while (fasting). You may have this done every 1-3 years. STD (sexually transmitted disease) testing, if you are at risk. Follow these instructions at home: Eating and drinking  Eat a diet that includes fresh fruits and vegetables, whole grains, lean protein, and low-fat dairy products. Take vitamin and mineral supplements as recommended by your health care provider. Do not drink alcohol if your health care provider tells you not to drink. If you drink alcohol: Limit how much you have to 0-2 drinks a day. Be aware of how much alcohol is in your drink. In the [place] one drink equals one 12 oz bottle of beer (355 mL), one 5 oz glass of wine (148 mL), or one 1 oz glass of hard liquor (44 mL). Lifestyle Take daily care of your teeth and gums. Brush your teeth every morning and night with fluoride toothpaste. Floss one time each day. Stay active. Exercise for at least 30 minutes 5 or more days each week. Do not use any products that contain nicotine or tobacco, such as cigarettes, e-cigarettes, and chewing tobacco. If you need help quitting, ask your health care provider. Do not use drugs. If you are sexually active, practice  safe sex. Use a condom or other form of protection to prevent STIs (sexually transmitted infections). If told by your health care provider, take low-dose aspirin daily starting at age 71. Find healthy ways to cope with stress, such as: Meditation, yoga, or listening to  music. Journaling. Talking to a trusted person. Spending time with friends and family. Safety Always wear your seat belt while driving or riding in a vehicle. Do not drive: If you have been drinking alcohol. Do not ride with someone who has been drinking. When you are tired or distracted. While texting. Wear a helmet and other protective equipment during sports activities. If you have firearms in your house, make sure you follow all gun safety procedures. What's next? Go to your health care provider once a year for an annual wellness visit. Ask your health care provider how often you should have your eyes and teeth checked. Stay up to date on all vaccines. This information is not intended to replace advice given to you by your health care provider. Make sure you discuss any questions you have with your health care provider. Document Revised: 05/31/2020 Document Reviewed: 03/17/2018 Elsevier Patient Education  2022 [employer].

## 2020-12-29 NOTE — Progress Notes (Signed)
Subjective:    Patient ID: Jacob Hart, male    DOB: 01/14/1966, 55 y.o.   MRN: [id number]  HPI Chief Complaint  Patient presents with   Annual Exam    CPE   Diabetes   He is here for a complete physical exam.  Denies any concerns today.   DM- checking BS at home. Consistent with today's Hgb A1c of 6.6%  Taking medications daily without any concerns.  Recent eye exam.  No foot concerns.  HL- statin daily  Recent lipids done at [place]. LDL 71, HDL 32  Mood is good. Taking medication daily. Communicating better with spouse.   Social history: Lives with wife, works as Furniture conservator/restorer  Denies smoking, drinking alcohol, drug use Diet: healthy. Semi vegetarian  Exercise: walks and garden. 10 mile hike over the weekend on the beach  Immunizations: declines Covid booster. UTD otherwise. Flu shot today Shingles vaccine at the [place] maintenance:   Colonoscopy: 06/2019  Last PSA: 2021 Last Dental Exam: in the last 6 months at [place] Exam: in the past year   Wears seatbelt always, uses sunscreen, smoke detectors in home and functioning, does not text while driving, feels safe in home environment.  Reviewed allergies, medications, past medical, surgical, family, and social history.    Review of Systems Review of Systems Constitutional: -fever, -chills, -sweats, -unexpected weight change,-fatigue ENT: -runny nose, -ear pain, -sore throat Cardiology:  -chest pain, -palpitations, -edema Respiratory: -cough, -shortness of breath, -wheezing Gastroenterology: -abdominal pain, -nausea, -vomiting, -diarrhea, -constipation  Hematology: -bleeding or bruising problems Musculoskeletal: -arthralgias, -myalgias, -joint swelling, -back pain Ophthalmology: -vision changes Urology: -dysuria, -difficulty urinating, -hematuria, -urinary frequency, -urgency Neurology: -headache, -weakness, -tingling, -numbness       Objective:   Physical Exam BP  110/72   Pulse 80   Ht 5\' 9"  (1.753 m)   Wt 189 lb (85.7 kg)   SpO2 95%   BMI 27.91 kg/m   General Appearance:    Alert, cooperative, no distress, appears stated age  Head:    Normocephalic, without obvious abnormality, atraumatic  Eyes:    PERRL, conjunctiva/corneas clear, EOM's intact  Ears:    Normal TM's and external ear canals  Nose:   Mask on   Throat:   Mask on   Neck:   Supple, no lymphadenopathy;  thyroid:  no   enlargement/tenderness/nodules; no JVD  Back:    Spine nontender, no curvature, ROM normal, no CVA     tenderness  Lungs:     Clear to auscultation bilaterally without wheezes, rales or     ronchi; respirations unlabored  Chest Wall:    No tenderness or deformity   Heart:    Regular rate and rhythm, S1 and S2 normal, no murmur, rub   or gallop  Breast Exam:    No chest wall tenderness, masses or gynecomastia  Abdomen:     Soft, non-tender, nondistended, normoactive bowel sounds,    no masses, no hepatosplenomegaly  Genitalia:    Not done      Extremities:   No clubbing, cyanosis or edema. Normal sensation of feet to monofilament.   Pulses:   2+ and symmetric all extremities  Skin:   Skin color, texture, turgor normal, no rashes or lesions  Lymph nodes:   Cervical, supraclavicular, and axillary nodes normal  Neurologic:   CNII-XII intact, normal strength, sensation and gait          Psych:   Normal  mood, affect, hygiene and grooming.        Assessment & Plan:  Routine general medical examination at a health care facility - Plan: CBC with Differential/Platelet, Comprehensive metabolic panel -preventive healthcare reviewed and updated. Mood is good.  Counseled on healthy lifestyle including diet and exercise.  He is up-to-date on eye exam and dental exam.  Discussed safety and health promotion.  Immunizations reviewed.  Encouraged him to continue taking good care of himself.  Type 2 diabetes mellitus with mild nonproliferative retinopathy without macular edema,  unspecified laterality, unspecified whether long term insulin use (HCC) - Plan: CBC with Differential/Platelet, Comprehensive metabolic panel, TSH, POCT glycosylated hemoglobin (Hb A1C), Microalbumin / creatinine urine ratio -controlled. Hgb A1c 6.6% today. Continue medication and low carb diet. Continue exercise. Foot exam done and normal. Urine microalbumin done. UTD on eye exam.  Taking statin.   Hypertension associated with diabetes ([place]) -well controlled. Continue medication   Gastroesophageal reflux disease, unspecified whether esophagitis present -not bothersome  Anxiety and depression -mood is good. Continue medication.   Needs flu shot - Plan: Flu Vaccine QUAD 6+ mos PF IM (Fluarix Quad PF)  Screening for prostate cancer - Plan: PSA  Vitamin D deficiency -per labs done at [facility] last month. Recommend starting on vitamin D supplement

## 2020-12-30 ENCOUNTER — Ambulatory Visit (INDEPENDENT_AMBULATORY_CARE_PROVIDER_SITE_OTHER): Payer: No Typology Code available for payment source | Admitting: Family Medicine

## 2020-12-30 ENCOUNTER — Encounter: Payer: Self-pay | Admitting: Family Medicine

## 2020-12-30 ENCOUNTER — Other Ambulatory Visit: Payer: Self-pay

## 2020-12-30 VITALS — BP 110/72 | HR 80 | Ht 69.0 in | Wt 189.0 lb

## 2020-12-30 DIAGNOSIS — Z0000 Encounter for general adult medical examination without abnormal findings: Secondary | ICD-10-CM

## 2020-12-30 DIAGNOSIS — F419 Anxiety disorder, unspecified: Secondary | ICD-10-CM | POA: Diagnosis not present

## 2020-12-30 DIAGNOSIS — I152 Hypertension secondary to endocrine disorders: Secondary | ICD-10-CM

## 2020-12-30 DIAGNOSIS — Z125 Encounter for screening for malignant neoplasm of prostate: Secondary | ICD-10-CM

## 2020-12-30 DIAGNOSIS — F32A Depression, unspecified: Secondary | ICD-10-CM

## 2020-12-30 DIAGNOSIS — Z23 Encounter for immunization: Secondary | ICD-10-CM

## 2020-12-30 DIAGNOSIS — E113299 Type 2 diabetes mellitus with mild nonproliferative diabetic retinopathy without macular edema, unspecified eye: Secondary | ICD-10-CM | POA: Diagnosis not present

## 2020-12-30 DIAGNOSIS — K219 Gastro-esophageal reflux disease without esophagitis: Secondary | ICD-10-CM

## 2020-12-30 DIAGNOSIS — E559 Vitamin D deficiency, unspecified: Secondary | ICD-10-CM

## 2020-12-30 DIAGNOSIS — E1159 Type 2 diabetes mellitus with other circulatory complications: Secondary | ICD-10-CM | POA: Diagnosis not present

## 2020-12-30 LAB — POCT GLYCOSYLATED HEMOGLOBIN (HGB A1C): Hemoglobin A1C: 6.6 % — AB (ref 4.0–5.6)

## 2020-12-30 NOTE — Progress Notes (Signed)
Abstracted results from [place], original to be scanned to chart

## 2020-12-31 LAB — COMPREHENSIVE METABOLIC PANEL
ALT: 27 IU/L (ref 0–44)
AST: 23 IU/L (ref 0–40)
Albumin/Globulin Ratio: 2.6 — ABNORMAL HIGH (ref 1.2–2.2)
Albumin: 4.5 g/dL (ref 3.8–4.9)
Alkaline Phosphatase: 92 IU/L (ref 44–121)
BUN/Creatinine Ratio: 26 — ABNORMAL HIGH (ref 9–20)
BUN: 21 mg/dL (ref 6–24)
Bilirubin Total: 0.3 mg/dL (ref 0.0–1.2)
CO2: 21 mmol/L (ref 20–29)
Calcium: 8.6 mg/dL — ABNORMAL LOW (ref 8.7–10.2)
Chloride: 106 mmol/L (ref 96–106)
Creatinine, Ser: 0.82 mg/dL (ref 0.76–1.27)
Globulin, Total: 1.7 g/dL (ref 1.5–4.5)
Glucose: 133 mg/dL — ABNORMAL HIGH (ref 65–99)
Potassium: 4.4 mmol/L (ref 3.5–5.2)
Sodium: 143 mmol/L (ref 134–144)
Total Protein: 6.2 g/dL (ref 6.0–8.5)
eGFR: 104 mL/min/{1.73_m2} (ref >59)

## 2020-12-31 LAB — CBC WITH DIFFERENTIAL/PLATELET
Basophils Absolute: 0 10*3/uL (ref 0.0–0.2)
Basos: 1 %
EOS (ABSOLUTE): 0.2 10*3/uL (ref 0.0–0.4)
Eos: 3 %
Hematocrit: 46.5 % (ref 37.5–51.0)
Hemoglobin: 16 g/dL (ref 13.0–17.7)
Immature Grans (Abs): 0 10*3/uL (ref 0.0–0.1)
Immature Granulocytes: 0 %
Lymphocytes Absolute: 1.5 10*3/uL (ref 0.7–3.1)
Lymphs: 24 %
MCH: 32.1 pg (ref 26.6–33.0)
MCHC: 34.4 g/dL (ref 31.5–35.7)
MCV: 93 fL (ref 79–97)
Monocytes Absolute: 0.7 10*3/uL (ref 0.1–0.9)
Monocytes: 11 %
Neutrophils Absolute: 3.9 10*3/uL (ref 1.4–7.0)
Neutrophils: 61 %
Platelets: 219 10*3/uL (ref 150–450)
RBC: 4.98 x10E6/uL (ref 4.14–5.80)
RDW: 12.9 % (ref 11.6–15.4)
WBC: 6.3 10*3/uL (ref 3.4–10.8)

## 2020-12-31 LAB — TSH: TSH: 1.64 u[IU]/mL (ref 0.450–4.500)

## 2020-12-31 LAB — PSA: Prostate Specific Ag, Serum: 1.7 ng/mL (ref 0.0–4.0)

## 2020-12-31 LAB — MICROALBUMIN / CREATININE URINE RATIO
Creatinine, Urine: 35.3 mg/dL
Microalb/Creat Ratio: 12 mg/g creat (ref 0–29)
Microalbumin, Urine: 4.4 ug/mL

## 2021-01-12 ENCOUNTER — Other Ambulatory Visit: Payer: Self-pay | Admitting: Family Medicine

## 2021-01-13 MED ORDER — ESCITALOPRAM OXALATE 20 MG PO TABS: 20.0000 mg | ORAL_TABLET | Freq: Every day | ORAL | 0 refills | Status: DC

## 2021-01-13 MED ORDER — ESCITALOPRAM OXALATE 20 MG PO TABS: 20.0000 mg | ORAL_TABLET | Freq: Every day | ORAL | 1 refills | Status: DC

## 2021-01-27 ENCOUNTER — Encounter: Payer: No Typology Code available for payment source | Admitting: Family Medicine

## 2021-02-10 ENCOUNTER — Telehealth: Payer: Self-pay

## 2021-02-10 DIAGNOSIS — I152 Hypertension secondary to endocrine disorders: Secondary | ICD-10-CM

## 2021-02-10 MED ORDER — ENALAPRIL MALEATE 5 MG PO TABS: 5.0000 mg | ORAL_TABLET | Freq: Every day | ORAL | 0 refills | Status: DC

## 2021-02-10 NOTE — Telephone Encounter (Signed)
Refill request received via fax.

## 2021-02-11 ENCOUNTER — Telehealth: Payer: Self-pay

## 2021-02-11 DIAGNOSIS — E1165 Type 2 diabetes mellitus with hyperglycemia: Secondary | ICD-10-CM

## 2021-02-11 MED ORDER — TRULICITY 0.75 MG/0.5ML ~~LOC~~ SOAJ: 0.7500 mg | SUBCUTANEOUS | 0 refills | Status: DC

## 2021-02-11 NOTE — Telephone Encounter (Signed)
Fax received

## 2021-02-18 NOTE — Progress Notes (Signed)
[facility] Note  02/21/2021     CHIEF COMPLAINT Patient presents for Retina Follow Up   HISTORY OF PRESENT ILLNESS: Jacob Hart is a 55 y.o. male who presents to the clinic today for:   HPI     Retina Follow Up   Patient presents with  Diabetic Retinopathy.  In both eyes.  Severity is mild.  Duration of 2 months.  Since onset it is stable.  I, the attending physician,  performed the HPI with the patient and updated documentation appropriately.        Comments   Pt here for 2 mo ret f/u for NPDR OU. Pt states vision is about the same, no changes or issues noted. Most recent A1C a few mo ago was 6.7, 190 blood sugar this am.       Last edited by Bernarda Caffey, MD on 02/22/2021  1:48 AM.     Pt states no change in vision, last A1c was 6.6 on 09.26.22   Referring physician: Girtha Rm, PA-C No address on file  HISTORICAL INFORMATION:   Selected notes from the MEDICAL RECORD NUMBER Referred by [name], NP-C for DM exam LEE:  Ocular Hx- PMH-DM (last A1C: 7.0, taking jardiance, metformin) HTN, anxiety, depression    CURRENT MEDICATIONS: Current Outpatient Medications (Ophthalmic Drugs)  Medication Sig   erythromycin ophthalmic ointment Place a 1/2 inch ribbon of ointment into the lower eyelid. (Patient not taking: Reported on 02/21/2021)   No current facility-administered medications for this visit. (Ophthalmic Drugs)   Current Outpatient Medications (Other)  Medication Sig   atorvastatin (LIPITOR) 10 MG tablet TAKE 1 TABLET BY MOUTH EVERY DAY   buPROPion (WELLBUTRIN SR) 100 MG 12 hr tablet Take 1 tablet (100 mg total) by mouth daily.   Dulaglutide (TRULICITY) 1.61 WR/6.0AV SOPN Inject 0.75 mg into the skin once a week.   enalapril (VASOTEC) 5 MG tablet Take 1 tablet (5 mg total) by mouth daily.   escitalopram (LEXAPRO) 20 MG tablet Take 1 tablet (20 mg total) by mouth daily.   Ginkgo Biloba 40 MG TABS Take 1  tablet by mouth in the morning and at bedtime.   glucose blood test strip Test once a day. Pt uses one touch verio flex meter   JARDIANCE 25 MG TABS tablet TAKE 1 TABLET (25 MG TOTAL) BY MOUTH DAILY BEFORE BREAKFAST.   metFORMIN (GLUCOPHAGE) 500 MG tablet Take 1 tablet (500 mg total) by mouth 2 (two) times daily with a meal.   omeprazole (PRILOSEC) 40 MG capsule Take 1 capsule (40 mg total) by mouth daily.   ONETOUCH DELICA LANCETS FINE MISC Test once a day   No current facility-administered medications for this visit. (Other)    REVIEW OF SYSTEMS: ROS   Positive for: Endocrine, Eyes Negative for: Constitutional, Gastrointestinal, Neurological, Skin, Genitourinary, Musculoskeletal, HENT, Cardiovascular, Respiratory, Psychiatric, Allergic/Imm, Heme/Lymph Last edited by Kingsley Spittle, COT on 02/21/2021  7:52 AM.      ALLERGIES Allergies  Allergen Reactions   Sulfa Antibiotics Hives    PAST MEDICAL HISTORY Past Medical History:  Diagnosis Date   Allergy    Anxiety and depression 12/23/2016   Controlled type 2 diabetes mellitus without complication, without long-term current use of insulin ([place]) 12/23/2016   GERD (gastroesophageal reflux disease) 12/23/2016   History of esophageal stricture    dilation done in 2016 at [facility]   Hypertension associated with diabetes ([place]) 12/23/2016   Substance abuse (  [place])    Uncontrolled diabetes mellitus type 2 without complications 1/50/5697   Past Surgical History:  Procedure Laterality Date   ABDOMINAL SURGERY     as a child   ESOPHAGOGASTRODUODENOSCOPY  08/02/2014   mild chronic nonspecific gastritis, stomach and endoscopic biopises. reflux esophagitis   ESOPHAGOGASTRODUODENOSCOPY (EGD) WITH ESOPHAGEAL DILATION     mild chronic stomach gastritis per record in 2016   NASAL SEPTUM SURGERY     TONSILLECTOMY     VASECTOMY      FAMILY HISTORY Family History  Problem Relation Age of Onset   Lung cancer Mother 38   Prostate  cancer Father 47   Bladder Cancer Father    Lung cancer Maternal Grandfather    Colon cancer Paternal Grandmother    CAD Brother     SOCIAL HISTORY Social History   Tobacco Use   Smoking status: Never   Smokeless tobacco: Current    Types: Snuff  Vaping Use   Vaping Use: Never used  Substance Use Topics   Alcohol use: No    Comment: recovering alcoholic, 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   Drug use: No        OPHTHALMIC EXAM: Base Eye Exam     Visual Acuity (Snellen - Linear)       Right Left   Dist [place] 20/20 -2 20/20 -1         Tonometry (Tonopen, 7:59 AM)       Right Left   Pressure 19 18         Pupils       Dark Light Shape React APD   Right 4 3 Round Brisk None   Left 4 3 Round Brisk None         Visual Fields (Counting fingers)       Left Right    Full Full         Extraocular Movement       Right Left    Full, Ortho Full, Ortho         Neuro/Psych     Oriented x3: Yes   Mood/Affect: Normal         Dilation     Both eyes: 1.0% Mydriacyl, 2.5% Phenylephrine @ 7:59 AM           Slit Lamp and Fundus Exam     Slit Lamp Exam       Right Left   Lids/Lashes Dermatochalasis - upper lid Dermatochalasis - upper lid   Conjunctiva/Sclera White and quiet White and quiet   Cornea Arcus, trace PEE, trace tear film debris Arcus, trace PEE, trace tear film debris   Anterior Chamber Deep and quiet Deep and quiet   Iris Round and dilated, No NVI Round and dilated, No NVI   Lens 1-2+ Nuclear sclerosis, 1-2+ Cortical cataract 1-2+ Nuclear sclerosis, 1-2+ Cortical cataract   Anterior Vitreous Vitreous syneresis Vitreous syneresis         Fundus Exam       Right Left   Disc Tilted disc, Temporal Peripapillary atrophy, Pink and Sharp Tilted disc, Temporal Peripapillary atrophy, Pink and Sharp   C/D Ratio 0.4 0.5   Macula Flat, Good foveal reflex, mild Retinal pigment epithelial mottling, scattered Microaneurysms blunted foveal reflex, focal  MA/cystic changes temporal fovea - slightly improved, scattered MA greatest temporal macula   Vessels mild attenuation, mild tortuousity attenuated, mild tortuousity   Periphery Attached, scattered MA's/DBH, prominent DBH SN quad Attached, rare MA  IMAGING AND PROCEDURES  Imaging and Procedures for _0 @  OCT, Retina - OU - Both Eyes       Right Eye Quality was good. Central Foveal Thickness: 320. Progression has been stable. Findings include normal foveal contour, no IRF, no SRF, vitreomacular adhesion (No DME).   Left Eye Quality was good. Central Foveal Thickness: 334. Progression has worsened. Findings include no SRF, intraretinal fluid, abnormal foveal contour, vitreomacular adhesion , intraretinal hyper-reflective material (Mild improvement in IRF/IRHM temporal macula/fovea).   Notes *Images captured and stored on drive  Diagnosis / Impression:  OD: no DME OS: Mild improvement in IRF/IRHM temporal macula/fovea  Clinical management:  See below  Abbreviations: NFP - Normal foveal profile. CME - cystoid macular edema. PED - pigment epithelial detachment. IRF - intraretinal fluid. SRF - subretinal fluid. EZ - ellipsoid zone. ERM - epiretinal membrane. ORA - outer retinal atrophy. ORT - outer retinal tubulation. SRHM - subretinal hyper-reflective material       Fluorescein Angiography Optos (Transit OS)       Right Eye Progression has no prior data. Early phase findings include microaneurysm. Mid/Late phase findings include microaneurysm, leakage (No NV).   Left Eye Progression has no prior data. Early phase findings include microaneurysm. Mid/Late phase findings include microaneurysm, leakage (No NV, prominent MA temporal to fovea).   Notes **Images stored on drive**  Impression: Moderate NPDR OU Leaking MA OU No NV OU OS: prominent MA temporal to fovea            ASSESSMENT/PLAN:    ICD-10-CM   1. Mild nonproliferative diabetic  retinopathy of both eyes without macular edema associated with type 2 diabetes mellitus (HCC)  D98.3382 OCT, Retina - OU - Both Eyes    Fluorescein Angiography Optos (Transit OS)    2. Retinal edema  H35.81     3. Essential hypertension  I10     4. Hypertensive retinopathy of both eyes  H35.033     5. Combined forms of age-related cataract of both eyes  H25.813       1,2. Mild Non-proliferative diabetic retinopathy OU  - OS w/ mild DME/cystic changes temporal fovea/macula  - A1c: 6.6 on 09.22.22 - exam shows scattered MA OU; no NV - FA (11.18.22) shows no NV OU, OS with prominent MA temporal to fovea -- potential focal laser target - BCVA remains 20/20 OU - OCT shows: OD: no DME; OS: Mild improvement in IRF/IRHM temporal macula/fovea - f/u in 3 months, DFE, OCT  3,4. Hypertensive retinopathy OU - discussed importance of tight BP control - monitor   5. Combined form age-related cataract OU - The symptoms of cataract, surgical options, and treatments and risks were discussed with patient. - discussed diagnosis and progression - not yet visually significant - monitor for now  Ophthalmic Meds Ordered this visit:  No orders of the defined types were placed in this encounter.     Return in about 3 months (around 05/24/2021) for f/u NPDR OU, DFE, OCT.  There are no Patient Instructions on file for this visit.   Explained the diagnoses, plan, and follow up with the patient and they expressed understanding.  Patient expressed understanding of the importance of proper follow up care.   This document serves as a record of services personally performed by Gardiner Sleeper, MD, PhD. It was created on their behalf by Leonie Douglas, an ophthalmic technician. The creation of this record is the provider's dictation and/or activities during the visit.    Electronically  signed by: Leonie Douglas COA, 02/22/21  1:54 AM  This document serves as a record of services personally performed by Gardiner Sleeper, MD, PhD. It was created on their behalf by San Jetty. Owens Shark, OA an ophthalmic technician. The creation of this record is the provider's dictation and/or activities during the visit.    Electronically signed by: San Jetty. Owens Shark, [place] 11.18.2022 1:54 AM  Gardiner Sleeper, M.D., Ph.D. Diseases & Surgery of the Retina and [place] 02/21/2021  I have reviewed the above documentation for accuracy and completeness, and I agree with the above. Gardiner Sleeper, M.D., Ph.D. 02/22/21 1:54 AM   Abbreviations: M myopia (nearsighted); A astigmatism; H hyperopia (farsighted); P presbyopia; Mrx spectacle prescription;  CTL contact lenses; OD right eye; OS left eye; OU both eyes  XT exotropia; ET esotropia; PEK punctate epithelial keratitis; PEE punctate epithelial erosions; DES dry eye syndrome; MGD meibomian gland dysfunction; ATs artificial tears; PFAT's preservative free artificial tears; [place] nuclear sclerotic cataract; PSC posterior subcapsular cataract; ERM epi-retinal membrane; PVD posterior vitreous detachment; RD retinal detachment; DM diabetes mellitus; DR diabetic retinopathy; NPDR non-proliferative diabetic retinopathy; PDR proliferative diabetic retinopathy; CSME clinically significant macular edema; DME diabetic macular edema; dbh dot blot hemorrhages; CWS cotton wool spot; POAG primary open angle glaucoma; C/D cup-to-disc ratio; HVF humphrey visual field; GVF goldmann visual field; OCT optical coherence tomography; IOP intraocular pressure; BRVO Branch retinal vein occlusion; CRVO central retinal vein occlusion; CRAO central retinal artery occlusion; BRAO branch retinal artery occlusion; RT retinal tear; SB scleral buckle; PPV pars plana vitrectomy; VH Vitreous hemorrhage; PRP panretinal laser photocoagulation; IVK intravitreal kenalog; VMT vitreomacular traction; MH Macular hole;  NVD neovascularization of the disc; NVE neovascularization elsewhere; AREDS age  related eye disease study; ARMD age related macular degeneration; POAG primary open angle glaucoma; EBMD epithelial/anterior basement membrane dystrophy; ACIOL anterior chamber intraocular lens; IOL intraocular lens; PCIOL posterior chamber intraocular lens; Phaco/IOL phacoemulsification with intraocular lens placement; [place] photorefractive keratectomy; LASIK laser assisted in situ keratomileusis; HTN hypertension; DM diabetes mellitus; COPD chronic obstructive pulmonary disease

## 2021-02-21 ENCOUNTER — Encounter (INDEPENDENT_AMBULATORY_CARE_PROVIDER_SITE_OTHER): Payer: Self-pay | Admitting: Ophthalmology

## 2021-02-21 ENCOUNTER — Ambulatory Visit (INDEPENDENT_AMBULATORY_CARE_PROVIDER_SITE_OTHER): Payer: No Typology Code available for payment source | Admitting: Ophthalmology

## 2021-02-21 ENCOUNTER — Other Ambulatory Visit: Payer: Self-pay

## 2021-02-21 DIAGNOSIS — E113293 Type 2 diabetes mellitus with mild nonproliferative diabetic retinopathy without macular edema, bilateral: Secondary | ICD-10-CM

## 2021-02-21 DIAGNOSIS — H25813 Combined forms of age-related cataract, bilateral: Secondary | ICD-10-CM

## 2021-02-21 DIAGNOSIS — H35033 Hypertensive retinopathy, bilateral: Secondary | ICD-10-CM | POA: Diagnosis not present

## 2021-02-21 DIAGNOSIS — I10 Essential (primary) hypertension: Secondary | ICD-10-CM

## 2021-02-21 DIAGNOSIS — H3581 Retinal edema: Secondary | ICD-10-CM

## 2021-02-22 ENCOUNTER — Encounter (INDEPENDENT_AMBULATORY_CARE_PROVIDER_SITE_OTHER): Payer: Self-pay | Admitting: Ophthalmology

## 2021-03-21 ENCOUNTER — Telehealth: Payer: No Typology Code available for payment source | Admitting: Family

## 2021-03-21 DIAGNOSIS — U071 COVID-19: Secondary | ICD-10-CM

## 2021-03-21 MED ORDER — MOLNUPIRAVIR EUA 200MG CAPSULE: 4.0000 | ORAL_CAPSULE | Freq: Two times a day (BID) | ORAL | 0 refills | Status: AC

## 2021-03-21 MED ORDER — BENZONATATE 100 MG PO CAPS: 100.0000 mg | ORAL_CAPSULE | Freq: Three times a day (TID) | ORAL | 0 refills | Status: DC | PRN

## 2021-03-21 NOTE — Progress Notes (Signed)
Virtual Visit Consent   [place], you are scheduled for a virtual visit with a [facility] provider today.     Just as with appointments in the office, your consent must be obtained to participate.  Your consent will be active for this visit and any virtual visit you may have with one of our providers in the next 365 days.     If you have a MyChart account, a copy of this consent can be sent to you electronically.  All virtual visits are billed to your insurance company just like a traditional visit in the office.    As this is a virtual visit, video technology does not allow for your provider to perform a traditional examination.  This may limit your provider's ability to fully assess your condition.  If your provider identifies any concerns that need to be evaluated in person or the need to arrange testing (such as labs, EKG, etc.), we will make arrangements to do so.     Although advances in technology are sophisticated, we cannot ensure that it will always work on either your end or our end.  If the connection with a video visit is poor, the visit may have to be switched to a telephone visit.  With either a video or telephone visit, we are not always able to ensure that we have a secure connection.     I need to obtain your verbal consent now.   Are you willing to proceed with your visit today?    Jacob Hart has provided verbal consent on 03/21/2021 for a virtual visit (video or telephone).   Evelina Dun, FNP   Date: 03/21/2021 3:24 PM   Virtual Visit via Video Note   I, Evelina Dun, connected with  Jacob Hart  ([id number], May 27, 1969) on 03/21/21 at  3:45 PM EST by a video-enabled telemedicine application and verified that I am speaking with the correct person using two identifiers.  Location: Patient: Virtual Visit Location Patient: Home Provider: Virtual Visit Location Provider: Home Office   I discussed the limitations of evaluation and  management by telemedicine and the availability of in person appointments. The patient expressed understanding and agreed to proceed.    History of Present Illness: Jacob Hart is a 55 y.o. who identifies as a male who was assigned male at birth, and is being seen today for COVID. He states his symptoms started two days ago.   HPI: Cough This is a new problem. The current episode started in the past 7 days. The problem has been waxing and waning. The problem occurs every few minutes. The cough is Non-productive. Associated symptoms include chills, headaches, myalgias and nasal congestion. Pertinent negatives include no ear congestion, ear pain, fever, shortness of breath or wheezing. He has tried rest and OTC cough suppressant for the symptoms. The treatment provided mild relief.   Problems:  Patient Active Problem List   Diagnosis Date Noted   Vitamin D deficiency 12/30/2020   Uncontrolled type 2 diabetes mellitus with hyperglycemia ([place]) 11/20/2019   Chronic cough 05/04/2019   History of esophageal dilatation 05/04/2019   Erectile dysfunction 01/06/2019   Mild sleep apnea 01/02/2019   Mild nonproliferative retinopathy due to secondary diabetes ([place]) 01/02/2019   Type 2 diabetes mellitus with mild nonproliferative retinopathy without macular edema (HCC) 12/23/2016   Gastroesophageal reflux disease 12/23/2016   Anxiety and depression 12/23/2016   Hypertension associated with diabetes ([place]) 12/23/2016    Allergies:  Allergies  Allergen Reactions   Sulfa Antibiotics Hives   Medications:  Current Outpatient Medications:    benzonatate (TESSALON PERLES) 100 MG capsule, Take 1 capsule (100 mg total) by mouth 3 (three) times daily as needed., Disp: 20 capsule, Rfl: 0   molnupiravir EUA (LAGEVRIO) 200 mg CAPS capsule, Take 4 capsules (800 mg total) by mouth 2 (two) times daily for 5 days., Disp: 40 capsule, Rfl: 0   atorvastatin (LIPITOR) 10 MG tablet, TAKE 1 TABLET BY  MOUTH EVERY DAY, Disp: 30 tablet, Rfl: 5   buPROPion (WELLBUTRIN SR) 100 MG 12 hr tablet, Take 1 tablet (100 mg total) by mouth daily., Disp: 30 tablet, Rfl: 4   Dulaglutide (TRULICITY) 2.58 NI/7.7OE SOPN, Inject 0.75 mg into the skin once a week., Disp: 5 mL, Rfl: 0   enalapril (VASOTEC) 5 MG tablet, Take 1 tablet (5 mg total) by mouth daily., Disp: 90 tablet, Rfl: 0   erythromycin ophthalmic ointment, Place a 1/2 inch ribbon of ointment into the lower eyelid. (Patient not taking: Reported on 02/21/2021), Disp: 3.5 g, Rfl: 0   escitalopram (LEXAPRO) 20 MG tablet, Take 1 tablet (20 mg total) by mouth daily., Disp: 90 tablet, Rfl: 1   Ginkgo Biloba 40 MG TABS, Take 1 tablet by mouth in the morning and at bedtime., Disp: , Rfl:    glucose blood test strip, Test once a day. Pt uses one touch verio flex meter, Disp: 100 each, Rfl: 2   JARDIANCE 25 MG TABS tablet, TAKE 1 TABLET (25 MG TOTAL) BY MOUTH DAILY BEFORE BREAKFAST., Disp: 30 tablet, Rfl: 2   metFORMIN (GLUCOPHAGE) 500 MG tablet, Take 1 tablet (500 mg total) by mouth 2 (two) times daily with a meal., Disp: 180 tablet, Rfl: 1   omeprazole (PRILOSEC) 40 MG capsule, Take 1 capsule (40 mg total) by mouth daily., Disp: 90 capsule, Rfl: 3   ONETOUCH DELICA LANCETS FINE MISC, Test once a day, Disp: 100 each, Rfl: 1  Observations/Objective: Patient is well-developed, well-nourished in no acute distress.  Resting comfortably  at home.  Head is normocephalic, atraumatic.  No labored breathing.  Speech is clear and coherent with logical content.  Patient is alert and oriented at baseline.  Nasal congestion  Assessment and Plan: 1. COVID-19 - molnupiravir EUA (LAGEVRIO) 200 mg CAPS capsule; Take 4 capsules (800 mg total) by mouth 2 (two) times daily for 5 days.  Dispense: 40 capsule; Refill: 0 - benzonatate (TESSALON PERLES) 100 MG capsule; Take 1 capsule (100 mg total) by mouth 3 (three) times daily as needed.  Dispense: 20 capsule;  Refill: 0 COVID positive, rest, force fluids, tylenol as needed, Quarantine for at least 5 days and you are fever free, then must wear a mask out in public from day 4-23, report any worsening symptoms such as increased shortness of breath, swelling, or continued high fevers. Possible adverse effects discussed with antivirals.    Follow Up Instructions: I discussed the assessment and treatment plan with the patient. The patient was provided an opportunity to ask questions and all were answered. The patient agreed with the plan and demonstrated an understanding of the instructions.  A copy of instructions were sent to the patient via MyChart unless otherwise noted below.     The patient was advised to call back or seek an in-person evaluation if the symptoms worsen or if the condition fails to improve as anticipated.  Time:  I spent 6 minutes with the patient via telehealth technology discussing the above  problems/concerns.    Evelina Dun, FNP

## 2021-03-25 NOTE — Telephone Encounter (Signed)
Our office can not write this note because he is not a patient with this office. Please advise.

## 2021-04-12 ENCOUNTER — Ambulatory Visit
Admission: EM | Admit: 2021-04-12 | Discharge: 2021-04-12 | Disposition: A | Discharge status: Home / Self Care | Payer: No Typology Code available for payment source | Attending: Urgent Care | Admitting: Urgent Care

## 2021-04-12 ENCOUNTER — Ambulatory Visit: Payer: Self-pay

## 2021-04-12 ENCOUNTER — Other Ambulatory Visit: Payer: Self-pay

## 2021-04-12 DIAGNOSIS — R052 Subacute cough: Secondary | ICD-10-CM

## 2021-04-12 DIAGNOSIS — J3089 Other allergic rhinitis: Secondary | ICD-10-CM

## 2021-04-12 DIAGNOSIS — J018 Other acute sinusitis: Secondary | ICD-10-CM

## 2021-04-12 DIAGNOSIS — H938X2 Other specified disorders of left ear: Secondary | ICD-10-CM

## 2021-04-12 MED ORDER — AMOXICILLIN 875 MG PO TABS: 875.0000 mg | ORAL_TABLET | Freq: Two times a day (BID) | ORAL | 0 refills | Status: DC

## 2021-04-12 MED ORDER — LEVOCETIRIZINE DIHYDROCHLORIDE 5 MG PO TABS: 5.0000 mg | ORAL_TABLET | Freq: Every evening | ORAL | 0 refills | Status: DC

## 2021-04-12 MED ORDER — PREDNISONE 20 MG PO TABS
ORAL_TABLET | ORAL | 0 refills | Status: DC
Start: 2021-04-12 — End: 2021-06-30

## 2021-04-12 MED ORDER — BENZONATATE 100 MG PO CAPS
100.0000 mg | ORAL_CAPSULE | Freq: Three times a day (TID) | ORAL | 0 refills | Status: DC | PRN
Start: 2021-04-12 — End: 2021-06-30

## 2021-04-12 MED ORDER — PROMETHAZINE-DM 6.25-15 MG/5ML PO SYRP: 5.0000 mL | ORAL_SOLUTION | Freq: Every evening | ORAL | 0 refills | Status: DC | PRN

## 2021-04-12 NOTE — ED Provider Notes (Signed)
[place]   MRN: [id number] DOB: 03/02/1966  Subjective:   Jacob Hart is a 56 y.o. male presenting for 3-week history of persistent sinus congestion, sinus pressure, sinus headaches, left ear pressure, body aches, coughing.  Patient is a type II diabetic, is treated without insulin.  No history of asthma or COPD.  Patient is not a smoker.  No chest pain, shortness of breath or wheezing.  He does have a history of allergic rhinitis and has been taking pseudoephedrine and Allegra.  No current facility-administered medications for this encounter.  Current Outpatient Medications:    atorvastatin (LIPITOR) 10 MG tablet, TAKE 1 TABLET BY MOUTH EVERY DAY, Disp: 30 tablet, Rfl: 5   benzonatate (TESSALON PERLES) 100 MG capsule, Take 1 capsule (100 mg total) by mouth 3 (three) times daily as needed., Disp: 20 capsule, Rfl: 0   buPROPion (WELLBUTRIN SR) 100 MG 12 hr tablet, Take 1 tablet (100 mg total) by mouth daily., Disp: 30 tablet, Rfl: 4   Dulaglutide (TRULICITY) 7.16 RC/7.8LF SOPN, Inject 0.75 mg into the skin once a week., Disp: 5 mL, Rfl: 0   enalapril (VASOTEC) 5 MG tablet, Take 1 tablet (5 mg total) by mouth daily., Disp: 90 tablet, Rfl: 0   erythromycin ophthalmic ointment, Place a 1/2 inch ribbon of ointment into the lower eyelid. (Patient not taking: Reported on 02/21/2021), Disp: 3.5 g, Rfl: 0   escitalopram (LEXAPRO) 20 MG tablet, Take 1 tablet (20 mg total) by mouth daily., Disp: 90 tablet, Rfl: 1   Ginkgo Biloba 40 MG TABS, Take 1 tablet by mouth in the morning and at bedtime., Disp: , Rfl:    glucose blood test strip, Test once a day. Pt uses one touch verio flex meter, Disp: 100 each, Rfl: 2   JARDIANCE 25 MG TABS tablet, TAKE 1 TABLET (25 MG TOTAL) BY MOUTH DAILY BEFORE BREAKFAST., Disp: 30 tablet, Rfl: 2   metFORMIN (GLUCOPHAGE) 500 MG tablet, Take 1 tablet (500 mg total) by mouth 2 (two) times daily with a meal., Disp: 180 tablet, Rfl: 1   omeprazole  (PRILOSEC) 40 MG capsule, Take 1 capsule (40 mg total) by mouth daily., Disp: 90 capsule, Rfl: 3   ONETOUCH DELICA LANCETS FINE MISC, Test once a day, Disp: 100 each, Rfl: 1   Allergies  Allergen Reactions   Sulfa Antibiotics Hives    Past Medical History:  Diagnosis Date   Allergy    Anxiety and depression 12/23/2016   Controlled type 2 diabetes mellitus without complication, without long-term current use of insulin ([place]) 12/23/2016   GERD (gastroesophageal reflux disease) 12/23/2016   History of esophageal stricture    dilation done in 2016 at [facility]   Hypertension associated with diabetes ([place]) 12/23/2016   Substance abuse ([place])    Uncontrolled diabetes mellitus type 2 without complications 11/13/1749     Past Surgical History:  Procedure Laterality Date   ABDOMINAL SURGERY     as a child   ESOPHAGOGASTRODUODENOSCOPY  08/02/2014   mild chronic nonspecific gastritis, stomach and endoscopic biopises. reflux esophagitis   ESOPHAGOGASTRODUODENOSCOPY (EGD) WITH ESOPHAGEAL DILATION     mild chronic stomach gastritis per record in 2016   NASAL SEPTUM SURGERY     TONSILLECTOMY     VASECTOMY      Family History  Problem Relation Age of Onset   Lung cancer Mother 50   Prostate cancer Father 42   Bladder Cancer Father    Lung cancer Maternal Grandfather  Colon cancer Paternal Grandmother    CAD Brother     Social History   Tobacco Use   Smoking status: Never   Smokeless tobacco: Current    Types: Snuff  Vaping Use   Vaping Use: Never used  Substance Use Topics   Alcohol use: No    Comment: recovering alcoholic, 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  Physical Exam Constitutional:      General: He is not in acute distress.    Appearance: Normal appearance. He is well-developed. He is  not ill-appearing, toxic-appearing or diaphoretic.  HENT:     Head: Normocephalic and atraumatic.     Right Ear: Tympanic membrane, ear canal and external ear normal.     Left Ear: Ear canal and external ear normal.     Ears:     Comments: Left TM slightly injected.    Nose: Congestion and rhinorrhea present.     Mouth/Throat:     Mouth: Mucous membranes are moist.     Pharynx: No oropharyngeal exudate or posterior oropharyngeal erythema.  Eyes:     General: No scleral icterus.       Right eye: No discharge.        Left eye: No discharge.     Extraocular Movements: Extraocular movements intact.     Conjunctiva/sclera: Conjunctivae normal.  Neck:     Meningeal: Brudzinski's sign and Kernig's sign absent.  Cardiovascular:     Rate and Rhythm: Normal rate and regular rhythm.     Heart sounds: Normal heart sounds. No murmur heard.   No friction rub. No gallop.  Pulmonary:     Effort: Pulmonary effort is normal. No respiratory distress.     Breath sounds: Normal breath sounds. No stridor. No wheezing, rhonchi or rales.  Musculoskeletal:     Cervical back: No rigidity.  Neurological:     Mental Status: He is alert and oriented to person, place, and time.     Cranial Nerves: No cranial nerve deficit.     Motor: No weakness.     Coordination: Coordination normal.     Gait: Gait normal.  Psychiatric:        Mood and Affect: Mood normal.        Behavior: Behavior normal.        Thought Content: Thought content normal.      Assessment and Plan :   PDMP not reviewed this encounter.  1. Acute non-recurrent sinusitis of other sinus   2. Ear pressure, left   3. Subacute cough   4. Allergic rhinitis due to other allergic trigger, unspecified seasonality    Deferred imaging given clear cardiopulmonary exam, hemodynamically stable vital signs.  In the context of his allergic rhinitis, will use an oral prednisone course.  Switch to Xyzal.  Will start empiric treatment for sinusitis  with amoxicillin.  Deferred respiratory testing given timeline of his illness.  Counseled patient on potential for adverse effects with medications prescribed/recommended today, ER and return-to-clinic precautions discussed, patient verbalized understanding.    Jaynee Eagles, PA-C 04/12/21 1007

## 2021-04-12 NOTE — ED Triage Notes (Signed)
Pt reports cough, left ear pressure, congestion, body aches, headache x 3 weeks.

## 2021-04-18 ENCOUNTER — Telehealth: Payer: No Typology Code available for payment source | Admitting: Family Medicine

## 2021-04-18 ENCOUNTER — Other Ambulatory Visit: Payer: Self-pay

## 2021-04-18 ENCOUNTER — Encounter: Payer: Self-pay | Admitting: Family Medicine

## 2021-04-18 VITALS — Temp 98.0°F | Wt 189.0 lb

## 2021-04-18 DIAGNOSIS — Z8616 Personal history of COVID-19: Secondary | ICD-10-CM | POA: Diagnosis not present

## 2021-04-18 DIAGNOSIS — J019 Acute sinusitis, unspecified: Secondary | ICD-10-CM

## 2021-04-18 MED ORDER — AMOXICILLIN-POT CLAVULANATE 875-125 MG PO TABS: 1.0000 | ORAL_TABLET | Freq: Two times a day (BID) | ORAL | 0 refills | Status: DC

## 2021-04-18 NOTE — Progress Notes (Signed)
° °  Subjective:    Patient ID: Jacob Hart, male    DOB: 08/19/1965, 56 y.o.   MRN: [id number]  HPI Documentation for virtual audio and video telecommunications through [place] encounter: The patient was located at home. 2 patient identifiers used.  The provider was located in the office. The patient did consent to this visit and is aware of possible charges through their insurance for this visit. The other persons participating in this telemedicine service were none. Time spent on call was 5 minutes and in review of previous records >20 minutes total for counseling and coordination of care. This virtual service is not related to other E/M service within previous 7 days.  He states that he had COVID right around Christmas time and did get much better from that but subsequently developed nasal congestion, purulent rhinorrhea as well as postnasal drainage, coughing as well as some sinus pressure but no fever, chills, sore throat.  He does not smoke and has no underlying allergies.  Review of Systems     Objective:   Physical Exam Alert and in no distress otherwise not examined       Assessment & Plan:  Acute sinusitis, recurrence not specified, unspecified location - Plan: amoxicillin-clavulanate (AUGMENTIN) 875-125 MG tablet  History of COVID-19 Encouraged him to call me if not entirely better when he finishes the antibiotic.

## 2021-05-06 ENCOUNTER — Other Ambulatory Visit: Payer: Self-pay | Admitting: Medical

## 2021-05-06 DIAGNOSIS — E1165 Type 2 diabetes mellitus with hyperglycemia: Secondary | ICD-10-CM

## 2021-05-22 ENCOUNTER — Telehealth: Payer: Self-pay | Admitting: Medical

## 2021-05-22 MED ORDER — OMEPRAZOLE 40 MG PO CPDR: 40.0000 mg | DELAYED_RELEASE_CAPSULE | Freq: Every day | ORAL | 0 refills | Status: DC

## 2021-05-22 NOTE — Telephone Encounter (Signed)
Pt called for refills of omeprazole. He has a appt with Audelia Acton in March. Please send to [place]. Pt can be reached at [phone].

## 2021-05-22 NOTE — Telephone Encounter (Signed)
He would now be a [facility] patient not a shane patient unless shane agreed to take him

## 2021-05-23 NOTE — Telephone Encounter (Signed)
Yes I did yesterday

## 2021-05-23 NOTE — Progress Notes (Shared)
[facility] Note  05/30/2021     CHIEF COMPLAINT Patient presents for No chief complaint on file.    HISTORY OF PRESENT ILLNESS: Jacob Hart is a 56 y.o. male who presents to the clinic today for:     Pt states no change in vision, last A1c was 6.6 on 09.26.22   Referring physician: Girtha Rm, PA-C [place],  [place] [id number]  HISTORICAL INFORMATION:   Selected notes from the MEDICAL RECORD NUMBER Referred by [name], NP-C for DM exam LEE:  Ocular Hx- PMH-DM (last A1C: 7.0, taking jardiance, metformin) HTN, anxiety, depression    CURRENT MEDICATIONS: Current Outpatient Medications (Ophthalmic Drugs)  Medication Sig   erythromycin ophthalmic ointment Place a 1/2 inch ribbon of ointment into the lower eyelid. (Patient not taking: Reported on 02/21/2021)   No current facility-administered medications for this visit. (Ophthalmic Drugs)   Current Outpatient Medications (Other)  Medication Sig   amoxicillin-clavulanate (AUGMENTIN) 875-125 MG tablet Take 1 tablet by mouth 2 (two) times daily.   atorvastatin (LIPITOR) 10 MG tablet TAKE 1 TABLET BY MOUTH EVERY DAY   benzonatate (TESSALON) 100 MG capsule Take 1-2 capsules (100-200 mg total) by mouth 3 (three) times daily as needed for cough.   buPROPion (WELLBUTRIN SR) 100 MG 12 hr tablet Take 1 tablet (100 mg total) by mouth daily.   enalapril (VASOTEC) 5 MG tablet Take 1 tablet (5 mg total) by mouth daily.   escitalopram (LEXAPRO) 20 MG tablet Take 1 tablet (20 mg total) by mouth daily.   Ginkgo Biloba 40 MG TABS Take 1 tablet by mouth in the morning and at bedtime.   glucose blood test strip Test once a day. Pt uses one touch verio flex meter   JARDIANCE 25 MG TABS tablet TAKE 1 TABLET (25 MG TOTAL) BY MOUTH DAILY BEFORE BREAKFAST.   levocetirizine (XYZAL) 5 MG tablet Take 1 tablet (5 mg total) by mouth every evening. (Patient not taking: Reported on  04/18/2021)   metFORMIN (GLUCOPHAGE) 500 MG tablet Take 1 tablet (500 mg total) by mouth 2 (two) times daily with a meal.   omeprazole (PRILOSEC) 40 MG capsule Take 1 capsule (40 mg total) by mouth daily.   ONETOUCH DELICA LANCETS FINE MISC Test once a day   predniSONE (DELTASONE) 20 MG tablet Take 2 tablets daily with breakfast. (Patient not taking: Reported on 04/18/2021)   promethazine-dextromethorphan (PROMETHAZINE-DM) 6.25-15 MG/5ML syrup Take 5 mLs by mouth at bedtime as needed for cough. (Patient not taking: Reported on 6/94/8546)   TRULICITY 2.70 JJ/0.0XF SOPN INJECT 0.75 MG INTO THE SKIN ONCE A WEEK.   No current facility-administered medications for this visit. (Other)    REVIEW OF SYSTEMS:    ALLERGIES Allergies  Allergen Reactions   Sulfa Antibiotics Hives    PAST MEDICAL HISTORY Past Medical History:  Diagnosis Date   Allergy    Anxiety and depression 12/23/2016   Controlled type 2 diabetes mellitus without complication, without long-term current use of insulin ([place]) 12/23/2016   GERD (gastroesophageal reflux disease) 12/23/2016   History of esophageal stricture    dilation done in 2016 at [facility]   Hypertension associated with diabetes ([place]) 12/23/2016   Substance abuse ([place])    Uncontrolled diabetes mellitus type 2 without complications 11/22/2991   Past Surgical History:  Procedure Laterality Date   ABDOMINAL SURGERY     as a child   ESOPHAGOGASTRODUODENOSCOPY  08/02/2014   mild  chronic nonspecific gastritis, stomach and endoscopic biopises. reflux esophagitis   ESOPHAGOGASTRODUODENOSCOPY (EGD) WITH ESOPHAGEAL DILATION     mild chronic stomach gastritis per record in 2016   NASAL SEPTUM SURGERY     TONSILLECTOMY     VASECTOMY      FAMILY HISTORY Family History  Problem Relation Age of Onset   Lung cancer Mother 11   Prostate cancer Father 22   Bladder Cancer Father    Lung cancer Maternal Grandfather    Colon cancer Paternal Grandmother    CAD  Brother     SOCIAL HISTORY Social History   Tobacco Use   Smoking status: Never   Smokeless tobacco: Current    Types: Snuff  Vaping Use   Vaping Use: Never used  Substance Use Topics   Alcohol use: No    Comment: recovering alcoholic, 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 Essential hypertension  I10     3. Hypertensive retinopathy of both eyes  H35.033     4. Combined forms of age-related cataract of both eyes  H25.813        1. Mild Non-proliferative diabetic retinopathy OU  - OS w/ mild DME/cystic changes temporal fovea/macula  - A1c: 6.6 on 09.22.22 - exam shows scattered MA OU; no NV - FA (11.18.22) shows no NV OU, OS with prominent MA temporal to fovea -- potential focal laser target  - BCVA remains 20/20 OU - OCT shows: OD: no DME; OS: Mild improvement in IRF/IRHM temporal macula/fovea - f/u in 3 months, DFE, OCT  2,3. Hypertensive retinopathy OU - discussed importance of tight BP control - monitor   4. Combined form age-related cataract OU - The symptoms of cataract, surgical options, and treatments and risks were discussed with patient. - discussed diagnosis and progression - not yet visually significant - monitor for now  Ophthalmic Meds Ordered this visit:  No orders of the defined types were placed in this encounter.      No follow-ups on file.  There are no Patient Instructions on file for this visit.   Explained the diagnoses, plan, and follow up with the patient and they expressed understanding.  Patient expressed understanding of the importance of proper follow up care.   This document serves as a record of services personally performed by Gardiner Sleeper, MD, PhD. It was created on their behalf by Leonie Douglas, an ophthalmic technician. The creation of this record is the provider's dictation and/or activities during the visit.    Electronically signed by: Leonie Douglas COA, 05/23/21  1:05 PM   Gardiner Sleeper, M.D., Ph.D. Diseases & Surgery of the Retina and Vitreous [facility]: M myopia (nearsighted); A astigmatism; H hyperopia (farsighted); P presbyopia; Mrx spectacle prescription;  CTL contact lenses; OD right eye; OS left eye; OU both eyes  XT exotropia; ET esotropia; PEK punctate epithelial keratitis; PEE punctate epithelial erosions; DES dry eye syndrome; MGD meibomian gland dysfunction; ATs artificial tears; PFAT's preservative free artificial tears; [place] nuclear sclerotic cataract; PSC posterior subcapsular cataract; ERM epi-retinal membrane; PVD posterior vitreous detachment;  RD retinal detachment; DM diabetes mellitus; DR diabetic retinopathy; NPDR non-proliferative diabetic retinopathy; PDR proliferative diabetic retinopathy; CSME clinically significant macular edema; DME diabetic macular edema; dbh dot blot hemorrhages; CWS cotton wool spot; POAG primary open angle glaucoma; C/D cup-to-disc ratio; HVF humphrey visual field; GVF goldmann visual field; OCT optical coherence tomography; IOP intraocular pressure; BRVO Branch retinal vein occlusion; CRVO central retinal vein occlusion; CRAO central retinal artery occlusion; BRAO branch retinal artery occlusion; RT retinal tear; SB scleral buckle; PPV pars plana vitrectomy; VH Vitreous hemorrhage; PRP panretinal laser photocoagulation; IVK intravitreal kenalog; VMT vitreomacular traction; MH Macular hole;  NVD neovascularization of the disc; NVE neovascularization elsewhere; AREDS age related eye disease study; ARMD age related macular degeneration; POAG primary open angle glaucoma; EBMD epithelial/anterior basement membrane dystrophy; ACIOL anterior  chamber intraocular lens; IOL intraocular lens; PCIOL posterior chamber intraocular lens; Phaco/IOL phacoemulsification with intraocular lens placement; [place] photorefractive keratectomy; LASIK laser assisted in situ keratomileusis; HTN hypertension; DM diabetes mellitus; COPD chronic obstructive pulmonary disease

## 2021-05-23 NOTE — Telephone Encounter (Signed)
Just wanted to clarify but that's fine for him to stay on the schedule with shane

## 2021-05-23 NOTE — Telephone Encounter (Signed)
Yes noted. Thank you

## 2021-05-30 ENCOUNTER — Encounter (INDEPENDENT_AMBULATORY_CARE_PROVIDER_SITE_OTHER): Payer: No Typology Code available for payment source | Admitting: Ophthalmology

## 2021-05-30 DIAGNOSIS — E113293 Type 2 diabetes mellitus with mild nonproliferative diabetic retinopathy without macular edema, bilateral: Secondary | ICD-10-CM

## 2021-05-30 DIAGNOSIS — I10 Essential (primary) hypertension: Secondary | ICD-10-CM

## 2021-05-30 DIAGNOSIS — H25813 Combined forms of age-related cataract, bilateral: Secondary | ICD-10-CM

## 2021-05-30 DIAGNOSIS — H35033 Hypertensive retinopathy, bilateral: Secondary | ICD-10-CM

## 2021-06-12 ENCOUNTER — Telehealth: Payer: No Typology Code available for payment source | Admitting: Physician Assistant

## 2021-06-12 ENCOUNTER — Encounter: Payer: Self-pay | Admitting: Physician Assistant

## 2021-06-12 VITALS — Ht 69.0 in | Wt 189.0 lb

## 2021-06-12 DIAGNOSIS — J3089 Other allergic rhinitis: Secondary | ICD-10-CM

## 2021-06-12 DIAGNOSIS — K219 Gastro-esophageal reflux disease without esophagitis: Secondary | ICD-10-CM | POA: Diagnosis not present

## 2021-06-12 DIAGNOSIS — E113299 Type 2 diabetes mellitus with mild nonproliferative diabetic retinopathy without macular edema, unspecified eye: Secondary | ICD-10-CM

## 2021-06-12 MED ORDER — FLUTICASONE PROPIONATE 50 MCG/ACT NA SUSP: 2.0000 | Freq: Every day | NASAL | 6 refills | Status: DC

## 2021-06-12 MED ORDER — OMEPRAZOLE 40 MG PO CPDR: 40.0000 mg | DELAYED_RELEASE_CAPSULE | Freq: Every day | ORAL | 0 refills | Status: DC

## 2021-06-12 MED ORDER — TRULICITY 0.75 MG/0.5ML ~~LOC~~ SOAJ: 0.7500 mg | SUBCUTANEOUS | 1 refills | Status: DC

## 2021-06-12 NOTE — Progress Notes (Signed)
Start time: 4:05 pm ?End time: 4:25 pm ? ?Virtual Visit via Video Note ? ? Patient ID: Jacob Hart, male    DOB: 1966/03/31, 56 y.o.   MRN: [id number] ? ?I connected with above patient on 06/12/21 by a video enabled telemedicine application and verified that I am speaking with the correct person using two identifiers. ? ?Location: ?Patient: home ?Provider: office ?  ?I discussed the limitations of evaluation and management by telemedicine and the availability of in person appointments. The patient expressed understanding and agreed to proceed. ? ?History of Present Illness: ? ?Chief Complaint  ?Patient presents with  ? Sinusitis  ?  Virtual-Pt feels like he has a sinus infection. Pt wakes up coughing and spits up yellow and sometimes clear mucous. This has been going on for awhile according to pt  ? ? ?Had covid in 03/2021, then had a sinus infection and feels like the symptoms never completely went away; + hx/o of allergies and states his sneezing, itchy, watery eyes, and post-nasal drip have all been worse lately;  denies fever / chills / nausea / vomiting / diarrhea / constipation; denies recent travel; denies sick contacts. ?  ? ?[place]   ?Observations/Objective: ? ?Ht '5\' 9"'$  (1.753 m)   Wt 189 lb (85.7 kg)   BMI 27.91 kg/m?  ? ? ?Assessment: ?Encounter Diagnoses  ?Name Primary?  ? Non-seasonal allergic rhinitis, unspecified trigger   ? GERD without esophagitis   ? Type 2 diabetes mellitus with mild nonproliferative retinopathy without macular edema, unspecified laterality, unspecified whether long term insulin use (HCC) Yes  ? ? ? ?Plan: ?You can take an over the counter antihistamine to help with allergic rhinitis / itching / hives: NON-DROWSY Allegra (Fexofenadine) 180 mg daily or NON-Drowsy Claritin (Loratidine) 10 mg daily  or DROWSY Benadryl (Diphenhydramine) 25 mg as directed or Zyrtec (Cetirizine) 10 mg daily. You can go to a store with a pharmacy and ask them to help you find these  medicines. ?For nasal congestion and post nasal drip you can use an OTC nasal saline rinse as well as one of the OTC nasal steroids (generic equivalent) like Flonase (Fluticasone) or Rhinocort (Budesonide) or Nasacort (Triamcinolone) or Nasonex (Mometasone Furoate).  ? ? ?Aaiden was seen today for sinusitis. ? ?Diagnoses and all orders for this visit: ? ?Type 2 diabetes mellitus with mild nonproliferative retinopathy without macular edema, unspecified laterality, unspecified whether long term insulin use ([place]) ?-     Dulaglutide (TRULICITY) 6.94 WN/4.6EV SOPN; Inject 0.75 mg into the skin once a week. ? ?Non-seasonal allergic rhinitis, unspecified trigger ?-     fluticasone (FLONASE) 50 MCG/ACT nasal spray; Place 2 sprays into both nostrils daily. ? ?GERD without esophagitis ?-     omeprazole (PRILOSEC) 40 MG capsule; Take 1 capsule (40 mg total) by mouth daily. ? ? ? ?Follow up: as needed ?If your symptoms get worse, please go to Urgent Care or go to the Emergency Department for further evaluation in person.  ? ? ? ?I discussed the assessment and treatment plan with the patient. The patient was provided an opportunity to ask questions and all were answered. The patient agreed with the plan and demonstrated an understanding of the instructions. ?  ?The patient was advised to call back or seek an in-person evaluation if the symptoms worsen or if the condition fails to improve as anticipated. ? ?I spent 15 minutes dedicated to the care of this patient, including pre-visit review of records, face to face time,  post-visit ordering of testing and documentation. ? ? ? ?Irene Pap, PA-C ?

## 2021-06-12 NOTE — Patient Instructions (Signed)
You can take an over the counter antihistamine to help with allergic rhinitis / itching / hives: NON-DROWSY Allegra (Fexofenadine) 180 mg daily or NON-Drowsy Claritin (Loratidine) 10 mg daily  or DROWSY Benadryl (Diphenhydramine) 25 mg as directed or Zyrtec (Cetirizine) 10 mg daily. You can go to a store with a pharmacy and ask them to help you find these medicines. ? ?For nasal congestion and post nasal drip you can use an OTC nasal saline rinse as well as one of the OTC nasal steroids (generic equivalent) like Flonase (Fluticasone) or Rhinocort (Budesonide) or Nasacort (Triamcinolone) or Nasonex (Mometasone Furoate).  ? ?

## 2021-06-30 ENCOUNTER — Other Ambulatory Visit: Payer: Self-pay

## 2021-06-30 ENCOUNTER — Ambulatory Visit: Payer: No Typology Code available for payment source | Admitting: Medical

## 2021-06-30 VITALS — BP 130/80 | HR 80 | Wt 194.2 lb

## 2021-06-30 DIAGNOSIS — J301 Allergic rhinitis due to pollen: Secondary | ICD-10-CM

## 2021-06-30 DIAGNOSIS — F32A Depression, unspecified: Secondary | ICD-10-CM

## 2021-06-30 DIAGNOSIS — R059 Cough, unspecified: Secondary | ICD-10-CM

## 2021-06-30 DIAGNOSIS — E785 Hyperlipidemia, unspecified: Secondary | ICD-10-CM | POA: Diagnosis not present

## 2021-06-30 DIAGNOSIS — L989 Disorder of the skin and subcutaneous tissue, unspecified: Secondary | ICD-10-CM

## 2021-06-30 DIAGNOSIS — F419 Anxiety disorder, unspecified: Secondary | ICD-10-CM | POA: Diagnosis not present

## 2021-06-30 DIAGNOSIS — E1165 Type 2 diabetes mellitus with hyperglycemia: Secondary | ICD-10-CM

## 2021-06-30 DIAGNOSIS — E113299 Type 2 diabetes mellitus with mild nonproliferative diabetic retinopathy without macular edema, unspecified eye: Secondary | ICD-10-CM

## 2021-06-30 DIAGNOSIS — E1159 Type 2 diabetes mellitus with other circulatory complications: Secondary | ICD-10-CM | POA: Diagnosis not present

## 2021-06-30 DIAGNOSIS — I152 Hypertension secondary to endocrine disorders: Secondary | ICD-10-CM

## 2021-06-30 DIAGNOSIS — E118 Type 2 diabetes mellitus with unspecified complications: Secondary | ICD-10-CM

## 2021-06-30 LAB — POCT GLYCOSYLATED HEMOGLOBIN (HGB A1C): Hemoglobin A1C: 7.3 % — AB (ref 4.0–5.6)

## 2021-06-30 NOTE — Addendum Note (Signed)
Addended by: Minette Headland A on: 06/30/2021 09:06 AM ? ? Modules accepted: Orders ? ?

## 2021-06-30 NOTE — Progress Notes (Signed)
Subjective: ? Jacob Hart is a 56 y.o. male who presents for ?Chief Complaint  ?Patient presents with  ? fasting med check  ?  Fasting med check, no concerns, coughing at night and was told to take xyzal but still has cough at night  ?   ?Medical team: ?Was seeing [name] NP until she left our practice. ?Sees eye doctor ?Sees dentist ? ?Concerns:  ?Diabetes - compliant with Jardiance '25mg'$  daily, Metformin '500mg'$  bid, and Trulicity 0.'75mg'$  weekly injection.   Checks glucose. 144 this morning.   Had run out of some medication in past 2 months, had some really high readings, 300-400s temporarily.   Was having trouble getting medications from the [place].   Back on medications currently. ? ?Hyperlipemia - compliant with atorvastatin '10mg'$  daily ? ?Taking Enalapril '5mg'$  daily for renal protection ? ?He notes hx/o alcoholism and trouble managing emotions.   Got sober 2009.  Been on Lexapro for years that has really helped.   He notes hx/o mood swings.  Got remarried a few years ago.   Started well burin a few years ago when wife had mental breakdown, as this was a hard period.  He notes being melancholy.   When he stresses out, stress eats sugar. ? ?Exercise - going 2 times per week at gym, treadmill and bike.  ? ?Having some post nasal drainage and cough.  On xyzal but doesn't feel it is working.  Also using Flonase  ? ?No other aggravating or relieving factors.   ? ?No other c/o. ? ?The following portions of the patient's history were reviewed and updated as appropriate: allergies, current medications, past family history, past medical history, past social history, past surgical history and problem list. ? ?ROS ?Otherwise as in subjective above ? ? ? ?Objective: ?BP 130/80   Pulse 80   Wt 194 lb 3.2 oz (88.1 kg)   BMI 28.68 kg/m?  ?Wt Readings from Last 3 Encounters:  ?06/30/21 194 lb 3.2 oz (88.1 kg)  ?06/12/21 189 lb (85.7 kg)  ?04/18/21 189 lb (85.7 kg)  ? ?BP Readings from Last 3 Encounters:  ?06/30/21  130/80  ?04/12/21 (!) 144/85  ?12/30/20 110/72  ? ? ?General appearance: alert, no distress, well developed, well nourished ?HEENT: normocephalic, sclerae anicteric, conjunctiva pink and moist, TMs pearly, nares patent, no discharge or erythema, pharynx normal ?Oral cavity: MMM, no lesions ?Neck: supple, no lymphadenopathy, no thyromegaly, no masses, no bruits ?Heart: RRR, normal S1, S2, no murmurs ?Lungs: CTA bilaterally, no wheezes, rhonchi, or rales ?Pulses: 2+ radial pulses, 2+ pedal pulses, normal cap refill ?Ext: no edema ? ?Diabetic Foot Exam - Simple   ?Simple Foot Form ?Diabetic Foot exam was performed with the following findings: Yes 06/30/2021  9:00 AM  ?Visual Inspection ?See comments: Yes ?Sensation Testing ?Intact to touch and monofilament testing bilaterally: Yes ?Pulse Check ?Posterior Tibialis and Dorsalis pulse intact bilaterally: Yes ?Comments ?Several thickened nails bilat, some yellowish coloration ?  ? ? ? ?Assessment: ?Encounter Diagnoses  ?Name Primary?  ? Type 2 diabetes mellitus with mild nonproliferative retinopathy without macular edema, unspecified laterality, unspecified whether long term insulin use ([place]) Yes  ? Anxiety and depression   ? Hypertension associated with diabetes ([place])   ? Hyperlipidemia, unspecified hyperlipidemia type   ? Skin lesion   ? Allergic rhinitis due to pollen, unspecified seasonality   ? Cough, unspecified type   ? ? ? ?Plan: ?Diabetes-hemoglobin A1c okay today.  He had paralysis of medicines recently but  says he needs no refills currently.  He just got that straightened out between [place] and the [place].  Continue glucose monitoring, discussed goal glucose, discussed rechecking if concerns for low or high readings.  Avoid hypoglycemia.  Continue current medications.  Continue daily foot checks.  See eye doctor soon and have them send [place] a copy of the eye doctor note ? ?Hyperlipidemia-continue statin.  Reviewed labs from September 2022 in the chart  record ? ?Kidney protection and hypertension/of diabetes-continue enalapril low-dose ? ?Anxiety and depression-doing fine on current Lexapro and Wellbutrin ? ?Skin lesion between eyes on forehead-referral to dermatology for further evaluation and likely biopsy ? ?Allergies, cough-add nasal saline flush, and use Flonase, potentially changed to Zyrtec or Allegra over-the-counter to see if that helps better ? ? ? ?[place] was seen today for fasting med check. ? ?Diagnoses and all orders for this visit: ? ?Type 2 diabetes mellitus with mild nonproliferative retinopathy without macular edema, unspecified laterality, unspecified whether long term insulin use ([place]) ? ?Anxiety and depression ? ?Hypertension associated with diabetes ([place]) ? ?Hyperlipidemia, unspecified hyperlipidemia type ? ?Skin lesion ?-     Ambulatory referral to Dermatology ? ?Allergic rhinitis due to pollen, unspecified seasonality ? ?Cough, unspecified type ? ? ? ?Follow up: 12/2021 for fasting physical ?

## 2021-07-02 NOTE — Progress Notes (Signed)
?[facility] Note ? ?07/07/2021 ?  ? ?CHIEF COMPLAINT ?Patient presents for Retina Follow Up ? ? ? ?HISTORY OF PRESENT ILLNESS: ?Jacob Hart is a 56 y.o. male who presents to the clinic today for:  ? ?HPI   ? ? Retina Follow Up   ?Patient presents with  Diabetic Retinopathy.  In both eyes.  This started 3 months ago.  I, the attending physician,  performed the HPI with the patient and updated documentation appropriately. ? ?  ?  ? ? Comments   ?Patient here for 3 months retina follow up for NPDR OU. Patient states vision doing fine. No eye pain.  ? ?  ?  ?Last edited by Bernarda Caffey, MD on 07/07/2021  8:25 AM.  ?  ?Pt states his last A1c was 7.3, which is up a little bit ? ? ?Referring physician: ?Girtha Rm, NP-C ?[place],  [place] [id number] ? ?HISTORICAL INFORMATION:  ? ?Selected notes from the [place] ?Referred by [name], NP-C for DM exam ?LEE:  ?Ocular Hx- ?PMH-DM (last A1C: 7.0, taking jardiance, metformin) HTN, anxiety, depression ?  ? ?CURRENT MEDICATIONS: ?No current outpatient medications on file. (Ophthalmic Drugs)  ? ?No current facility-administered medications for this visit. (Ophthalmic Drugs)  ? ?Current Outpatient Medications (Other)  ?Medication Sig  ? atorvastatin (LIPITOR) 10 MG tablet TAKE 1 TABLET BY MOUTH EVERY DAY  ? buPROPion (WELLBUTRIN SR) 100 MG 12 hr tablet Take 1 tablet (100 mg total) by mouth daily.  ? Dulaglutide (TRULICITY) 9.62 IW/9.7LG SOPN Inject 0.75 mg into the skin once a week.  ? enalapril (VASOTEC) 5 MG tablet Take 1 tablet (5 mg total) by mouth daily.  ? escitalopram (LEXAPRO) 20 MG tablet Take 1 tablet (20 mg total) by mouth daily.  ? fluticasone (FLONASE) 50 MCG/ACT nasal spray Place 2 sprays into both nostrils daily.  ? Ginkgo Biloba 40 MG TABS Take 1 tablet by mouth in the morning and at bedtime.  ? JARDIANCE 25 MG TABS tablet TAKE 1 TABLET (25 MG TOTAL) BY MOUTH DAILY BEFORE BREAKFAST.  ?  metFORMIN (GLUCOPHAGE) 500 MG tablet Take 1 tablet (500 mg total) by mouth 2 (two) times daily with a meal.  ? omeprazole (PRILOSEC) 40 MG capsule Take 1 capsule (40 mg total) by mouth daily.  ? levocetirizine (XYZAL) 5 MG tablet Take 1 tablet (5 mg total) by mouth every evening. (Patient not taking: Reported on 04/18/2021)  ? ?No current facility-administered medications for this visit. (Other)  ? ? ?REVIEW OF SYSTEMS: ?ROS   ?Positive for: Neurological, Endocrine, Eyes ?Negative for: Constitutional, Gastrointestinal, Skin, Genitourinary, Musculoskeletal, HENT, Cardiovascular, Respiratory, Psychiatric, Allergic/Imm, Heme/Lymph ?Last edited by Theodore Demark, COA on 07/07/2021  7:49 AM.  ?  ? ? ? ?ALLERGIES ?Allergies  ?Allergen Reactions  ? Sulfa Antibiotics Hives  ? ? ?PAST MEDICAL HISTORY ?Past Medical History:  ?Diagnosis Date  ? Allergy   ? Anxiety and depression 12/23/2016  ? Controlled type 2 diabetes mellitus without complication, without long-term current use of insulin ([place]) 12/23/2016  ? GERD (gastroesophageal reflux disease) 12/23/2016  ? History of esophageal stricture   ? dilation done in 2016 at [place]  ? Hypertension associated with diabetes ([place]) 12/23/2016  ? Substance abuse ([place])   ? Uncontrolled diabetes mellitus type 2 without complications 12/26/1939  ? ?Past Surgical History:  ?Procedure Laterality Date  ? ABDOMINAL SURGERY    ? as a child  ?  ESOPHAGOGASTRODUODENOSCOPY  08/02/2014  ? mild chronic nonspecific gastritis, stomach and endoscopic biopises. reflux esophagitis  ? ESOPHAGOGASTRODUODENOSCOPY (EGD) WITH ESOPHAGEAL DILATION    ? mild chronic stomach gastritis per record in 2016  ? NASAL SEPTUM SURGERY    ? TONSILLECTOMY    ? VASECTOMY    ? ? ?FAMILY HISTORY ?Family History  ?Problem Relation Age of Onset  ? Lung cancer Mother 27  ? Prostate cancer Father 40  ? Bladder Cancer Father   ? Lung cancer Maternal Grandfather   ? Colon cancer Paternal Grandmother   ? CAD Brother   ? ?SOCIAL  HISTORY ?Social History  ? ?Tobacco Use  ? Smoking status: Never  ? Smokeless tobacco: Current  ?  Types: Snuff  ?Vaping Use  ? Vaping Use: Never used  ?Substance Use Topics  ? Alcohol use: No  ?  Comment: recovering alcoholic, 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  ? Drug use: No  ?  ? ?  ?OPHTHALMIC EXAM: ?Base Eye Exam   ? ? Visual Acuity (Snellen - Linear)   ? ?   Right Left  ? Dist [place] 20/20 -2 20/20  ? ?  ?  ? ? Tonometry (Tonopen, 7:47 AM)   ? ?   Right Left  ? Pressure 12 12  ? ?  ?  ? ? Pupils   ? ?   Dark Light Shape React APD  ? Right 4 3 Round Brisk None  ? Left 4 3 Round Brisk None  ? ?  ?  ? ? Visual Fields (Counting fingers)   ? ?   Left Right  ?  Full Full  ? ?  ?  ? ? Extraocular Movement   ? ?   Right Left  ?  Full, Ortho Full, Ortho  ? ?  ?  ? ? Neuro/Psych   ? ? Oriented x3: Yes  ? Mood/Affect: Normal  ? ?  ?  ? ? Dilation   ? ? Both eyes: 1.0% Mydriacyl, 2.5% Phenylephrine @ 7:47 AM  ? ?  ?  ? ?  ? ?Slit Lamp and Fundus Exam   ? ? Slit Lamp Exam   ? ?   Right Left  ? Lids/Lashes Dermatochalasis - upper lid Dermatochalasis - upper lid  ? Conjunctiva/Sclera White and quiet White and quiet  ? Cornea Arcus, trace PEE, trace tear film debris Arcus, trace PEE, trace tear film debris  ? Anterior Chamber Deep and quiet Deep and quiet  ? Iris Round and dilated, No NVI Round and dilated, No NVI  ? Lens 1-2+ Nuclear sclerosis, 1-2+ Cortical cataract 1-2+ Nuclear sclerosis, 1-2+ Cortical cataract  ? Anterior Vitreous Vitreous syneresis Vitreous syneresis  ? ?  ?  ? ? Fundus Exam   ? ?   Right Left  ? Disc Tilted disc, Temporal Peripapillary atrophy, Pink and Sharp Tilted disc, Temporal Peripapillary atrophy, Pink and Sharp  ? C/D Ratio 0.4 0.5  ? Macula Flat, Good foveal reflex, mild Retinal pigment epithelial mottling, scattered Microaneurysms blunted foveal reflex, focal MA/cystic changes temporal fovea - slightly improved, scattered MA greatest temporal macula  ? Vessels mild attenuation, mild tortuosity attenuated, mild  tortuosity  ? Periphery Attached, scattered MA's Attached, scattered MA  ? ?  ?  ? ?  ?  ?IMAGING AND PROCEDURES  ?Imaging and Procedures for '@TODAY'$ @ ? ?OCT, Retina - OU - Both Eyes   ? ?   ?Right Eye ?Quality was good. Central Foveal Thickness: 321. Progression has been stable.  Findings include normal foveal contour, no IRF, no SRF, vitreomacular adhesion (No DME).  ? ?Left Eye ?Quality was good. Central Foveal Thickness: 319. Progression has improved. Findings include no SRF, intraretinal fluid, abnormal foveal contour, vitreomacular adhesion , intraretinal hyper-reflective material (Mild improvement in cystic changes temporal fovea).  ? ?Notes ?*Images captured and stored on drive ? ?Diagnosis / Impression:  ?OD: no DME ?OS: Mild improvement in cystic changes temporal fovea ? ?Clinical management:  ?See below ? ?Abbreviations: NFP - Normal foveal profile. CME - cystoid macular edema. PED - pigment epithelial detachment. IRF - intraretinal fluid. SRF - subretinal fluid. EZ - ellipsoid zone. ERM - epiretinal membrane. ORA - outer retinal atrophy. ORT - outer retinal tubulation. SRHM - subretinal hyper-reflective material ? ? ? ?  ?  ?  ? ?  ?ASSESSMENT/PLAN: ? ?  ICD-10-CM   ?1. Mild nonproliferative diabetic retinopathy of both eyes without macular edema associated with type 2 diabetes mellitus ([place])  B63.8937 OCT, Retina - OU - Both Eyes  ?  ?2. Essential hypertension  I10   ?  ?3. Hypertensive retinopathy of both eyes  H35.033   ?  ?4. Combined forms of age-related cataract of both eyes  H25.813   ?  ? ?1. Mild Non-proliferative diabetic retinopathy OU ? - OS w/ mild DME/cystic changes temporal fovea/macula ? - A1c: 7.3 on 03.27.23; 6.6 on 09.22.22 ?- exam shows scattered MA OU; no NV ?- FA (11.18.22) shows no NV OU, OS with prominent MA temporal to fovea -- potential focal laser target ?- BCVA remains 20/20 OU ?- OCT shows: OD: no DME; OS: Mild improvement in IRF/IRHM temporal macula/fovea ?- f/u in 6 months,  DFE, OCT ? ?2,3. Hypertensive retinopathy OU ?- discussed importance of tight BP control ?- monitor ? ?4. Combined form age-related cataract OU ?- The symptoms of cataract, surgical options, and treatments and risks we

## 2021-07-07 ENCOUNTER — Encounter (INDEPENDENT_AMBULATORY_CARE_PROVIDER_SITE_OTHER): Payer: Self-pay | Admitting: Ophthalmology

## 2021-07-07 ENCOUNTER — Ambulatory Visit (INDEPENDENT_AMBULATORY_CARE_PROVIDER_SITE_OTHER): Payer: No Typology Code available for payment source | Admitting: Ophthalmology

## 2021-07-07 DIAGNOSIS — H35033 Hypertensive retinopathy, bilateral: Secondary | ICD-10-CM | POA: Diagnosis not present

## 2021-07-07 DIAGNOSIS — I10 Essential (primary) hypertension: Secondary | ICD-10-CM

## 2021-07-07 DIAGNOSIS — H25813 Combined forms of age-related cataract, bilateral: Secondary | ICD-10-CM

## 2021-07-07 DIAGNOSIS — E113293 Type 2 diabetes mellitus with mild nonproliferative diabetic retinopathy without macular edema, bilateral: Secondary | ICD-10-CM | POA: Diagnosis not present

## 2021-07-07 LAB — HM DIABETES EYE EXAM

## 2021-07-09 ENCOUNTER — Other Ambulatory Visit: Payer: Self-pay | Admitting: Family Medicine

## 2021-07-09 DIAGNOSIS — E1159 Type 2 diabetes mellitus with other circulatory complications: Secondary | ICD-10-CM

## 2021-07-15 ENCOUNTER — Telehealth: Payer: Self-pay | Admitting: Medical

## 2021-07-15 NOTE — Telephone Encounter (Signed)
Referral Followup °

## 2021-07-25 ENCOUNTER — Encounter: Payer: Self-pay | Admitting: Internal Medicine

## 2021-07-29 ENCOUNTER — Telehealth: Payer: Self-pay

## 2021-07-29 ENCOUNTER — Other Ambulatory Visit: Payer: Self-pay | Admitting: Medical

## 2021-07-29 DIAGNOSIS — E118 Type 2 diabetes mellitus with unspecified complications: Secondary | ICD-10-CM

## 2021-07-29 DIAGNOSIS — E1165 Type 2 diabetes mellitus with hyperglycemia: Secondary | ICD-10-CM

## 2021-07-29 MED ORDER — ESCITALOPRAM OXALATE 20 MG PO TABS: 20.0000 mg | ORAL_TABLET | Freq: Every day | ORAL | 1 refills | Status: DC

## 2021-07-29 MED ORDER — METFORMIN HCL 500 MG PO TABS: 500.0000 mg | ORAL_TABLET | Freq: Two times a day (BID) | ORAL | 1 refills | Status: DC

## 2021-07-29 MED ORDER — EMPAGLIFLOZIN 25 MG PO TABS: ORAL_TABLET | ORAL | 1 refills | Status: DC

## 2021-07-29 NOTE — Telephone Encounter (Signed)
Pt left message needs refill Lexapro, Metformin & Jardiance pt has CPE scheduled 9/23 with Audelia Acton

## 2021-08-12 ENCOUNTER — Ambulatory Visit (HOSPITAL_COMMUNITY)
Admission: EM | Admit: 2021-08-12 | Discharge: 2021-08-12 | Disposition: A | Discharge status: Home / Self Care | Payer: No Typology Code available for payment source | Attending: Emergency Medicine | Admitting: Emergency Medicine

## 2021-08-12 ENCOUNTER — Other Ambulatory Visit: Payer: Self-pay

## 2021-08-12 ENCOUNTER — Encounter (HOSPITAL_COMMUNITY): Payer: Self-pay | Admitting: Emergency Medicine

## 2021-08-12 ENCOUNTER — Ambulatory Visit (INDEPENDENT_AMBULATORY_CARE_PROVIDER_SITE_OTHER): Payer: No Typology Code available for payment source

## 2021-08-12 DIAGNOSIS — M79672 Pain in left foot: Secondary | ICD-10-CM

## 2021-08-12 DIAGNOSIS — S91332A Puncture wound without foreign body, left foot, initial encounter: Secondary | ICD-10-CM | POA: Diagnosis not present

## 2021-08-12 MED ORDER — OXYCODONE HCL 5 MG PO TABS: 5.0000 mg | ORAL_TABLET | Freq: Four times a day (QID) | ORAL | 0 refills | Status: AC | PRN

## 2021-08-12 MED ORDER — DOXYCYCLINE HYCLATE 100 MG PO CAPS: 100.0000 mg | ORAL_CAPSULE | Freq: Two times a day (BID) | ORAL | 0 refills | Status: AC

## 2021-08-12 MED ORDER — IBUPROFEN 800 MG PO TABS: 800.0000 mg | ORAL_TABLET | Freq: Three times a day (TID) | ORAL | 0 refills | Status: DC

## 2021-08-12 NOTE — ED Provider Notes (Signed)
?Jacob Hart ? ? ? ?CSN: [id number] ?Arrival date & time: 08/12/21  1545 ? ? ?  ? ?History   ?Chief Complaint ?Chief Complaint  ?Patient presents with  ? Appointment  ?  4:00  ? Foot Pain  ? ? ?HPI ?Jacob Hart is a 56 y.o. male.  ? ?Patient presents with lateral left foot pain beginning 1 day ago after stepping on a unknown object in his garden.  Has noticed a discoloration to the area and is unsure if object is still in place.  Range of motion is intact.  Has not attempted treatment of symptoms.  History of diabetes.  ? ?Past Medical History:  ?Diagnosis Date  ? Allergy   ? Anxiety and depression 12/23/2016  ? Controlled type 2 diabetes mellitus without complication, without long-term current use of insulin ([place]) 12/23/2016  ? GERD (gastroesophageal reflux disease) 12/23/2016  ? History of esophageal stricture   ? dilation done in 2016 at [place]  ? Hypertension associated with diabetes ([place]) 12/23/2016  ? Substance abuse ([place])   ? Uncontrolled diabetes mellitus type 2 without complications 9/50/9326  ? ? ?Patient Active Problem List  ? Diagnosis Date Noted  ? Hyperlipidemia 06/30/2021  ? GERD without esophagitis 06/12/2021  ? Non-seasonal allergic rhinitis 06/12/2021  ? Vitamin D deficiency 12/30/2020  ? Chronic cough 05/04/2019  ? History of esophageal dilatation 05/04/2019  ? Erectile dysfunction 01/06/2019  ? Mild sleep apnea 01/02/2019  ? Mild nonproliferative retinopathy due to secondary diabetes ([place]) 01/02/2019  ? Type 2 diabetes mellitus with mild nonproliferative retinopathy without macular edema ([place]) 12/23/2016  ? Gastroesophageal reflux disease 12/23/2016  ? Anxiety and depression 12/23/2016  ? Hypertension associated with diabetes ([place]) 12/23/2016  ? ? ?Past Surgical History:  ?Procedure Laterality Date  ? ABDOMINAL SURGERY    ? as a child  ? ESOPHAGOGASTRODUODENOSCOPY  08/02/2014  ? mild chronic nonspecific gastritis, stomach and endoscopic biopises. reflux esophagitis  ?  ESOPHAGOGASTRODUODENOSCOPY (EGD) WITH ESOPHAGEAL DILATION    ? mild chronic stomach gastritis per record in 2016  ? NASAL SEPTUM SURGERY    ? TONSILLECTOMY    ? VASECTOMY    ? ? ? ? ? ?Home Medications   ? ?Prior to Admission medications   ?Medication Sig Start Date End Date Taking? Authorizing Provider  ?atorvastatin (LIPITOR) 10 MG tablet TAKE 1 TABLET BY MOUTH EVERY DAY 03/11/20   Henson, Vickie L, NP-C  ?buPROPion (WELLBUTRIN SR) 100 MG 12 hr tablet Take 1 tablet (100 mg total) by mouth daily. 02/22/20   Girtha Rm, NP-C  ?Dulaglutide (TRULICITY) 7.12 WP/8.0DX SOPN Inject 0.75 mg into the skin once a week. 06/12/21   Francis Gaines B, PA-C  ?empagliflozin (JARDIANCE) 25 MG TABS tablet TAKE 1 TABLET (25 MG TOTAL) BY MOUTH DAILY BEFORE BREAKFAST. 07/29/21   Tysinger, Camelia Eng, PA-C  ?enalapril (VASOTEC) 5 MG tablet TAKE 1 TABLET (5 MG TOTAL) BY MOUTH DAILY. 07/09/21   Tysinger, Camelia Eng, PA-C  ?escitalopram (LEXAPRO) 20 MG tablet Take 1 tablet (20 mg total) by mouth daily. 07/29/21   Tysinger, Camelia Eng, PA-C  ?fluticasone (FLONASE) 50 MCG/ACT nasal spray Place 2 sprays into both nostrils daily. 06/12/21   Irene Pap, PA-C  ?Ginkgo Biloba 40 MG TABS Take 1 tablet by mouth in the morning and at bedtime.    [provider]  ?levocetirizine (XYZAL) 5 MG tablet Take 1 tablet (5 mg total) by mouth every evening. ?Patient not taking: Reported on 04/18/2021 04/12/21  Jaynee Eagles, PA-C  ?metFORMIN (GLUCOPHAGE) 500 MG tablet Take 1 tablet (500 mg total) by mouth 2 (two) times daily with a meal. 07/29/21   Tysinger, Camelia Eng, PA-C  ?omeprazole (PRILOSEC) 40 MG capsule Take 1 capsule (40 mg total) by mouth daily. 06/12/21   Irene Pap, PA-C  ? ? ?Family History ?Family History  ?Problem Relation Age of Onset  ? Lung cancer Mother 51  ? Prostate cancer Father 63  ? Bladder Cancer Father   ? Lung cancer Maternal Grandfather   ? Colon cancer Paternal Grandmother   ? CAD Brother   ? ? ?Social History ?Social History   ? ?Tobacco Use  ? Smoking status: Never  ? Smokeless tobacco: Current  ?  Types: Snuff  ?Vaping Use  ? Vaping Use: Never used  ?Substance Use Topics  ? Alcohol use: No  ?  Comment: recovering alcoholic, 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  ? Drug use: No  ? ? ? ?Allergies   ?Sulfa antibiotics ? ? ?Review of Systems ?Review of Systems  ?Constitutional: Negative.   ?Respiratory: Negative.    ?Musculoskeletal: Negative.   ?Skin:  Positive for wound. Negative for color change, pallor and rash.  ?Neurological: Negative.   ? ? ?Physical Exam ?Triage Vital Signs ?ED Triage Vitals  ?Enc Vitals Group  ?   BP 08/12/21 1611 125/78  ?   Pulse Rate 08/12/21 1611 71  ?   Resp 08/12/21 1611 18  ?   Temp 08/12/21 1611 98 ?F (36.7 ?C)  ?   Temp Source 08/12/21 1611 Oral  ?   SpO2 08/12/21 1611 98 %  ?   Weight --   ?   Height --   ?   Head Circumference --   ?   Peak Flow --   ?   Pain Score 08/12/21 1608 4  ?   Pain Loc --   ?   Pain Edu? --   ?   Excl. in [place]? --   ? ?No data found. ? ?Updated Vital Signs ?BP 125/78   Pulse 71   Temp 98 ?F (36.7 ?C) (Oral)   Resp 18   SpO2 98%  ? ?Visual Acuity ?Right Eye Distance:   ?Left Eye Distance:   ?Bilateral Distance:   ? ?Right Eye Near:   ?Left Eye Near:    ?Bilateral Near:    ? ?Physical Exam ?Constitutional:   ?   Appearance: Normal appearance.  ?HENT:  ?   Head: Normocephalic.  ?Eyes:  ?   Extraocular Movements: Extraocular movements intact.  ?Pulmonary:  ?   Effort: Pulmonary effort is normal.  ?Feet:  ?   Comments: less than 0.5 cm puncture present to the lateral aspect of the left heel with dried blood over site, tender to palpation, no swelling or drainage noted, able to bear weight, 2+ pedal pulse, sensation intact ?Neurological:  ?   Mental Status: He is alert and oriented to person, place, and time. Mental status is at baseline.  ?Psychiatric:     ?   Mood and Affect: Mood normal.     ?   Behavior: Behavior normal.  ? ? ? ?UC Treatments / Results  ?Labs ?(all labs ordered are listed, but only  abnormal results are displayed) ?Labs Reviewed - No data to display ? ?EKG ? ? ?Radiology ?No results found. ? ?Procedures ?Procedures (including critical care time) ? ?Medications Ordered in UC ?Medications - No data to display ? ?Initial Impression / Assessment and  Plan / UC Course  ?I have reviewed the triage vital signs and the nursing notes. ? ?Pertinent labs & imaging results that were available during my care of the patient were reviewed by me and considered in my medical decision making (see chart for details). ? ?Puncture wound of left heel, initial encounter ? ?Able to visualize puncture wound on exam, cleansed in office with chlorhexidine, able to expel yellow pus from site, as unknown object and for puncture, x-ray completed, negative for foreign body to the affected area, able to visualize a foreign body around the second left toe, discussed with patient, not new and irrelevant to current visit, doxycycline 10-day course prescribed, , prescribed ibuprofen and oxycodone IR, PDMP reviewed, low risk, extended course due to history of diabetes, recommended cleansing daily with normal hygiene with diluted soapy water patting dry and covering with a nonadherent dressing, given short precautions to follow-up with urgent care or PCP for persistent site ?Final Clinical Impressions(s) / UC Diagnoses  ? ?Final diagnoses:  ?None  ? ?Discharge Instructions   ?None ?  ? ?ED Prescriptions   ?None ?  ? ?PDMP not reviewed this encounter. ?  ?Hans Eden, NP ?08/12/21 1658 ? ?

## 2021-08-12 NOTE — Discharge Instructions (Addendum)
Foot x-ray was negative for any foreign body in the affected area, able to see a object near your 2nd toe but this most likely is not new  ? ?Your wound has been cleansed here in the office and covered with a bandage, on examination I was able to pus from the puncture site therefore we will treat infection ? ?Begin use of doxycycline twice daily for the next 10 days to prophylactically cover for bacteria ? ?May use ibuprofen as needed every 8 hours for pain ? ?May use oxycodone every 6 hours as needed for severe pain, use sparingly as you will only be dispensed ? ?May cleanse area with normal hygiene using diluted soapy water, pat dry and covering with a Band-Aid if at risk for contamination ? ?May follow-up with your primary care doctor or urgent care if symptoms continue to persist or worsen ?

## 2021-08-12 NOTE — ED Triage Notes (Addendum)
Patient walked bare foot in garden yesterday.  Patient reports stepping on something in garden.  Today having a lot of pain to left foot.  Patient has a discolored area to lateral left foot.  Patient is a diabetic .  No swelling, pedal pulse 2 + ?

## 2021-09-18 ENCOUNTER — Other Ambulatory Visit: Payer: Self-pay | Admitting: Physician Assistant

## 2021-09-18 DIAGNOSIS — E113299 Type 2 diabetes mellitus with mild nonproliferative diabetic retinopathy without macular edema, unspecified eye: Secondary | ICD-10-CM

## 2021-09-18 DIAGNOSIS — K219 Gastro-esophageal reflux disease without esophagitis: Secondary | ICD-10-CM

## 2021-09-18 MED ORDER — OMEPRAZOLE 40 MG PO CPDR: 40.0000 mg | DELAYED_RELEASE_CAPSULE | Freq: Every day | ORAL | 0 refills | Status: DC

## 2021-10-09 ENCOUNTER — Other Ambulatory Visit: Payer: Self-pay | Admitting: Medical

## 2021-10-09 DIAGNOSIS — E113299 Type 2 diabetes mellitus with mild nonproliferative diabetic retinopathy without macular edema, unspecified eye: Secondary | ICD-10-CM

## 2021-12-10 ENCOUNTER — Telehealth: Payer: Self-pay

## 2021-12-10 ENCOUNTER — Encounter: Payer: Self-pay | Admitting: Internal Medicine

## 2021-12-10 NOTE — Telephone Encounter (Signed)
Pt. Called stating he needs new refills sent in for his Wellbutrin, enalapril, and Lipitor to his [facility]. Last apt 06/30/21 next apt 01/01/22.

## 2021-12-11 ENCOUNTER — Other Ambulatory Visit: Payer: Self-pay | Admitting: Medical

## 2021-12-11 DIAGNOSIS — E1165 Type 2 diabetes mellitus with hyperglycemia: Secondary | ICD-10-CM

## 2021-12-11 DIAGNOSIS — F32A Depression, unspecified: Secondary | ICD-10-CM

## 2021-12-11 DIAGNOSIS — E118 Type 2 diabetes mellitus with unspecified complications: Secondary | ICD-10-CM

## 2021-12-11 DIAGNOSIS — I152 Hypertension secondary to endocrine disorders: Secondary | ICD-10-CM

## 2021-12-11 MED ORDER — ATORVASTATIN CALCIUM 10 MG PO TABS: 10.0000 mg | ORAL_TABLET | Freq: Every day | ORAL | 0 refills | Status: DC

## 2021-12-11 MED ORDER — BUPROPION HCL ER (SR) 100 MG PO TB12: 100.0000 mg | ORAL_TABLET | Freq: Every day | ORAL | 0 refills | Status: DC

## 2021-12-11 MED ORDER — METFORMIN HCL 500 MG PO TABS: 500.0000 mg | ORAL_TABLET | Freq: Two times a day (BID) | ORAL | 0 refills | Status: DC

## 2021-12-11 MED ORDER — ENALAPRIL MALEATE 5 MG PO TABS: 5.0000 mg | ORAL_TABLET | Freq: Every day | ORAL | 0 refills | Status: DC

## 2021-12-11 MED ORDER — EMPAGLIFLOZIN 25 MG PO TABS: ORAL_TABLET | ORAL | 0 refills | Status: DC

## 2021-12-12 ENCOUNTER — Other Ambulatory Visit: Payer: Self-pay | Admitting: Internal Medicine

## 2021-12-12 DIAGNOSIS — I152 Hypertension secondary to endocrine disorders: Secondary | ICD-10-CM

## 2021-12-12 DIAGNOSIS — F32A Depression, unspecified: Secondary | ICD-10-CM

## 2021-12-12 MED ORDER — ENALAPRIL MALEATE 5 MG PO TABS: 5.0000 mg | ORAL_TABLET | Freq: Every day | ORAL | 0 refills | Status: DC

## 2021-12-12 MED ORDER — BUPROPION HCL ER (SR) 100 MG PO TB12: 100.0000 mg | ORAL_TABLET | Freq: Every day | ORAL | 0 refills | Status: DC

## 2021-12-12 MED ORDER — ATORVASTATIN CALCIUM 10 MG PO TABS: 10.0000 mg | ORAL_TABLET | Freq: Every day | ORAL | 0 refills | Status: DC

## 2021-12-17 ENCOUNTER — Other Ambulatory Visit: Payer: Self-pay | Admitting: Medical

## 2021-12-17 DIAGNOSIS — K219 Gastro-esophageal reflux disease without esophagitis: Secondary | ICD-10-CM

## 2022-01-01 ENCOUNTER — Encounter: Payer: Self-pay | Admitting: Medical

## 2022-01-01 ENCOUNTER — Ambulatory Visit: Payer: No Typology Code available for payment source | Admitting: Medical

## 2022-01-01 VITALS — BP 120/70 | HR 79 | Ht 70.5 in | Wt 184.8 lb

## 2022-01-01 DIAGNOSIS — J3089 Other allergic rhinitis: Secondary | ICD-10-CM | POA: Diagnosis not present

## 2022-01-01 DIAGNOSIS — G4730 Sleep apnea, unspecified: Secondary | ICD-10-CM | POA: Diagnosis not present

## 2022-01-01 DIAGNOSIS — Z125 Encounter for screening for malignant neoplasm of prostate: Secondary | ICD-10-CM | POA: Insufficient documentation

## 2022-01-01 DIAGNOSIS — E113299 Type 2 diabetes mellitus with mild nonproliferative diabetic retinopathy without macular edema, unspecified eye: Secondary | ICD-10-CM

## 2022-01-01 DIAGNOSIS — N529 Male erectile dysfunction, unspecified: Secondary | ICD-10-CM

## 2022-01-01 DIAGNOSIS — F419 Anxiety disorder, unspecified: Secondary | ICD-10-CM

## 2022-01-01 DIAGNOSIS — Z136 Encounter for screening for cardiovascular disorders: Secondary | ICD-10-CM | POA: Diagnosis not present

## 2022-01-01 DIAGNOSIS — E1159 Type 2 diabetes mellitus with other circulatory complications: Secondary | ICD-10-CM

## 2022-01-01 DIAGNOSIS — E559 Vitamin D deficiency, unspecified: Secondary | ICD-10-CM

## 2022-01-01 DIAGNOSIS — F32A Depression, unspecified: Secondary | ICD-10-CM

## 2022-01-01 DIAGNOSIS — Z0000 Encounter for general adult medical examination without abnormal findings: Secondary | ICD-10-CM | POA: Diagnosis not present

## 2022-01-01 DIAGNOSIS — K219 Gastro-esophageal reflux disease without esophagitis: Secondary | ICD-10-CM

## 2022-01-01 DIAGNOSIS — Z9889 Other specified postprocedural states: Secondary | ICD-10-CM

## 2022-01-01 DIAGNOSIS — Z2821 Immunization not carried out because of patient refusal: Secondary | ICD-10-CM

## 2022-01-01 DIAGNOSIS — I152 Hypertension secondary to endocrine disorders: Secondary | ICD-10-CM

## 2022-01-01 DIAGNOSIS — E785 Hyperlipidemia, unspecified: Secondary | ICD-10-CM

## 2022-01-01 DIAGNOSIS — E133299 Other specified diabetes mellitus with mild nonproliferative diabetic retinopathy without macular edema, unspecified eye: Secondary | ICD-10-CM

## 2022-01-01 LAB — POCT URINALYSIS DIP (PROADVANTAGE DEVICE)
Bilirubin, UA: NEGATIVE
Blood, UA: NEGATIVE
Glucose, UA: >=1000 mg/dL — AB
Leukocytes, UA: NEGATIVE
Nitrite, UA: NEGATIVE
Protein Ur, POC: NEGATIVE mg/dL
Specific Gravity, Urine: 1.01
Urobilinogen, Ur: NEGATIVE
pH, UA: 7 (ref 5.0–8.0)

## 2022-01-01 NOTE — Patient Instructions (Signed)
This visit was a preventative care visit, also known as wellness visit or routine physical.   Topics typically include healthy lifestyle, diet, exercise, preventative care, vaccinations, sick and well care, proper use of emergency dept and after hours care, as well as other concerns.     Recommendations: Continue to return yearly for your annual wellness and preventative care visits.  This gives [place] a chance to discuss healthy lifestyle, exercise, vaccinations, review your chart record, and perform screenings where appropriate.  I recommend you see your eye doctor yearly for routine vision care.  I recommend you see your dentist yearly for routine dental care including hygiene visits twice yearly.   Vaccination recommendations were reviewed Immunization History  Administered Date(s) Administered   Influenza,inj,Quad PF,6+ Mos 12/23/2016, 12/27/2017, 01/02/2019, 12/13/2019, 12/30/2020   Influenza-Unspecified 02/13/2013, 01/05/2019, 01/05/2020   Moderna Sars-Covid-2 Vaccination 07/29/2019, 08/26/2019   Pneumococcal Conjugate-13 12/27/2017   Pneumococcal Polysaccharide-23 05/04/2019   Tdap 12/23/2016   Zoster Recombinat (Shingrix) 12/13/2019, 04/26/2020    I recommend a yearly flu shot. Declines flu shot.  I recommend updated covid booster at the pharmacy   Screening for cancer: Colon cancer screening: I reviewed your colonoscopy on file that is up to date from 2021  We discussed PSA, prostate exam, and prostate cancer screening risks/benefits.     Skin cancer screening: Check your skin regularly for new changes, growing lesions, or other lesions of concern Come in for evaluation if you have skin lesions of concern.  Lung cancer screening: If you have a greater than 20 pack year history of tobacco use, then you may qualify for lung cancer screening with a chest CT scan.   Please call your insurance company to inquire about coverage for this test.  We currently don't have  screenings for other cancers besides breast, cervical, colon, and lung cancers.  If you have a strong family history of cancer or have other cancer screening concerns, please let me know.    Bone health: Get at least 150 minutes of aerobic exercise weekly Get weight bearing exercise at least once weekly Bone density test:  A bone density test is an imaging test that uses a type of X-ray to measure the amount of calcium and other minerals in your bones. The test may be used to diagnose or screen you for a condition that causes weak or thin bones (osteoporosis), predict your risk for a broken bone (fracture), or determine how well your osteoporosis treatment is working. The bone density test is recommended for females 38 and older, or females or males <19 if certain risk factors such as thyroid disease, long term use of steroids such as for asthma or rheumatological issues, vitamin D deficiency, estrogen deficiency, family history of osteoporosis, self or family history of fragility fracture in first degree relative.    Heart health: Get at least 150 minutes of aerobic exercise weekly Limit alcohol It is important to maintain a healthy blood pressure and healthy cholesterol numbers  Heart disease screening: Screening for heart disease includes screening for blood pressure, fasting lipids, glucose/diabetes screening, BMI height to weight ratio, reviewed of smoking status, physical activity, and diet.    Goals include blood pressure 120/80 or less, maintaining a healthy lipid/cholesterol profile, preventing diabetes or keeping diabetes numbers under good control, not smoking or using tobacco products, exercising most days per week or at least 150 minutes per week of exercise, and eating healthy variety of fruits and vegetables, healthy oils, and avoiding unhealthy food choices like fried  food, fast food, high sugar and high cholesterol foods.    Other tests may possibly include EKG test, CT  coronary calcium score, echocardiogram, exercise treadmill stress test.   Baseline EKG today  Consider CT coronary test   Medical care options: I recommend you continue to seek care here first for routine care.  We try really hard to have available appointments Monday through Friday daytime hours for sick visits, acute visits, and physicals.  Urgent care should be used for after hours and weekends for significant issues that cannot wait till the next day.  The emergency department should be used for significant potentially life-threatening emergencies.  The emergency department is expensive, can often have long wait times for less significant concerns, so try to utilize primary care, urgent care, or telemedicine when possible to avoid unnecessary trips to the emergency department.  Virtual visits and telemedicine have been introduced since the pandemic started in 2020, and can be convenient ways to receive medical care.  We offer virtual appointments as well to assist you in a variety of options to seek medical care.   Advanced Directives: I recommend you consider completing a [place] and Living Will.   These documents respect your wishes and help alleviate burdens on your loved ones if you were to become terminally ill or be in a position to need those documents enforced.    You can complete Advanced Directives yourself, have them notarized, then have copies made for our office, for you and for anybody you feel should have them in safe keeping.  Or, you can have an attorney prepare these documents.   If you haven't updated your Last Will and Testament in a while, it may be worthwhile having an attorney prepare these documents together and save on some costs.       Separate significant issues discussed: Hyperlipidemia-continue atorvastatin 10 mg, labs today  Kidney protection-continue enalapril 5 mg daily  Diabetes - pending labs I can give recommendations.  We will  likely continue Jardiance, will likely increased Metformin.  You wish to discontinue Trulcity at this time.  Follow up pending labs  Anxiety and depression - continue Wellbutrin and Lexapro

## 2022-01-01 NOTE — Progress Notes (Signed)
Subjective:   HPI  Jacob Hart is a 56 y.o. male who presents for Chief Complaint  Patient presents with   fasting cpe    Fasitng cpe,  BS are running high. Declines flu shot    Patient Care Team: Sanda Dejoy, Leward Quan as PCP - General (Family Medicine) Sees dentist Dr. Bernarda Caffey, retina specialist Dr. Bernestine Amass, chiropractor Dr. Thornton Park, GI  Concerns: Hypertension - Compliant with medicaiton  Hyperlipidemia - compliant with statin  Diabetes - not taking trulcity.  Has been on insulin in the past when HgbA1C was over 13%, compliant with metformin and Jardiance.  Sugars have been high of late, due to poor diet choices.   Loves sweets.    Anxiety and depression - taking Wellubtrin and Lexapro  Reviewed their medical, surgical, family, social, medication, and allergy history and updated chart as appropriate.  Past Medical History:  Diagnosis Date   Allergy    Anxiety and depression 12/23/2016   Controlled type 2 diabetes mellitus without complication, without long-term current use of insulin ([place]) 12/23/2016   GERD (gastroesophageal reflux disease) 12/23/2016   History of esophageal stricture    dilation done in 2016 at [facility]   Hypertension associated with diabetes ([place]) 12/23/2016   Substance abuse ([place])    Uncontrolled diabetes mellitus type 2 without complications 09/08/5407    Past Surgical History:  Procedure Laterality Date   ABDOMINAL SURGERY     as a child   ESOPHAGOGASTRODUODENOSCOPY  08/02/2014   mild chronic nonspecific gastritis, stomach and endoscopic biopises. reflux esophagitis   ESOPHAGOGASTRODUODENOSCOPY (EGD) WITH ESOPHAGEAL DILATION     mild chronic stomach gastritis per record in 2016   NASAL SEPTUM SURGERY     TONSILLECTOMY     VASECTOMY      Family History  Problem Relation Age of Onset   Lung cancer Mother 27   Prostate cancer Father 6   Bladder Cancer Father    Lung cancer Maternal Grandfather    Colon  cancer Paternal Grandmother    CAD Brother      Current Outpatient Medications:    atorvastatin (LIPITOR) 10 MG tablet, Take 1 tablet (10 mg total) by mouth daily., Disp: 90 tablet, Rfl: 0   buPROPion ER (WELLBUTRIN SR) 100 MG 12 hr tablet, Take 1 tablet (100 mg total) by mouth daily., Disp: 90 tablet, Rfl: 0   empagliflozin (JARDIANCE) 25 MG TABS tablet, TAKE 1 TABLET (25 MG TOTAL) BY MOUTH DAILY BEFORE BREAKFAST., Disp: 90 tablet, Rfl: 0   enalapril (VASOTEC) 5 MG tablet, Take 1 tablet (5 mg total) by mouth daily., Disp: 90 tablet, Rfl: 0   escitalopram (LEXAPRO) 20 MG tablet, Take 1 tablet (20 mg total) by mouth daily., Disp: 90 tablet, Rfl: 1   Ginkgo Biloba 40 MG TABS, Take 1 tablet by mouth in the morning and at bedtime., Disp: , Rfl:    metFORMIN (GLUCOPHAGE) 500 MG tablet, Take 1 tablet (500 mg total) by mouth 2 (two) times daily with a meal., Disp: 180 tablet, Rfl: 0   omeprazole (PRILOSEC) 40 MG capsule, Take 1 capsule by mouth once daily, Disp: 90 capsule, Rfl: 0   TRULICITY 8.11 BJ/4.7WG SOPN, INJECT 0.75 MG INTO THE SKIN ONCE A WEEK (Patient not taking: Reported on 01/01/2022), Disp: 4 mL, Rfl: 2  Allergies  Allergen Reactions   Sulfa Antibiotics Hives     Review of Systems Constitutional: -fever, -chills, -sweats, -unexpected weight change, -decreased appetite, -fatigue Allergy: -sneezing, -itching, -congestion  Dermatology: -changing moles, --rash, -lumps ENT: -runny nose, -ear pain, -sore throat, -hoarseness, -sinus pain, -teeth pain, - ringing in ears, -hearing loss, -nosebleeds Cardiology: -chest pain, -palpitations, -swelling, -difficulty breathing when lying flat, -waking up short of breath Respiratory: -cough, -shortness of breath, -difficulty breathing with exercise or exertion, -wheezing, -coughing up blood Gastroenterology: -abdominal pain, -nausea, -vomiting, -diarrhea, -constipation, -blood in stool, -changes in bowel movement, -difficulty swallowing or  eating Hematology: -bleeding, -bruising  Musculoskeletal: -joint aches, -muscle aches, -joint swelling, -back pain, -neck pain, -cramping, -changes in gait Ophthalmology: denies vision changes, eye redness, itching, discharge Urology: -burning with urination, -difficulty urinating, -blood in urine, -urinary frequency, -urgency, -incontinence Neurology: -headache, -weakness, -tingling, -numbness, -memory loss, -falls, -dizziness Psychology: -depressed mood, -agitation, -sleep problems Male GU: no testicular mass, pain, no lymph nodes swollen, no swelling, no rash.     01/01/2022    9:23 AM 06/30/2021    8:34 AM 12/30/2020    8:17 AM 07/24/2020    8:27 AM 01/25/2020    8:11 AM  Depression screen PHQ 2/9  Decreased Interest 0 0 0 0 0  Down, Depressed, Hopeless 0 0 0 0 0  PHQ - 2 Score 0 0 0 0 0        Objective:  BP 120/70   Pulse 79   Ht 5' 10.5" (1.791 m)   Wt 184 lb 12.8 oz (83.8 kg)   BMI 26.14 kg/m   Wt Readings from Last 3 Encounters:  01/01/22 184 lb 12.8 oz (83.8 kg)  06/30/21 194 lb 3.2 oz (88.1 kg)  06/12/21 189 lb (85.7 kg)   BP Readings from Last 3 Encounters:  01/01/22 120/70  08/12/21 125/78  06/30/21 130/80    General appearance: alert, no distress, WD/WN, Caucasian male Skin:  16m diameter raised somewhat pearly skin lesion between eyes of forehead, otherwise unremarkable otherwise HEENT: normocephalic, conjunctiva/corneas normal, sclerae anicteric, PERRLA, EOMi, nares patent, no discharge or erythema, pharynx normal Oral cavity: MMM, tongue normal, teeth normal Neck: supple, no lymphadenopathy, no thyromegaly, no masses, normal ROM, no bruits Chest: non tender, normal shape and expansion Heart: RRR, normal S1, S2, no murmurs Lungs: CTA bilaterally, no wheezes, rhonchi, or rales Abdomen: +bs, soft, non tender, non distended, no masses, no hepatomegaly, no splenomegaly, no bruits Back: non tender, normal ROM, no scoliosis Musculoskeletal: upper extremities  non tender, no obvious deformity, normal ROM throughout, lower extremities non tender, no obvious deformity, normal ROM throughout Extremities: no edema, no cyanosis, no clubbing Pulses: 2+ symmetric, upper and lower extremities, normal cap refill Neurological: alert, oriented x 3, CN2-12 intact, strength normal upper extremities and lower extremities, sensation normal throughout, DTRs 2+ throughout, no cerebellar signs, gait normal Psychiatric: normal affect, behavior normal, pleasant  GU: normal male external genitalia,circumcised, nontender, no masses, no hernia, no lymphadenopathy Rectal: declined/deferred  Diabetic Foot Exam - Simple   Simple Foot Form Diabetic Foot exam was performed with the following findings: Yes 01/01/2022  9:53 AM  Visual Inspection No deformities, no ulcerations, no other skin breakdown bilaterally: Yes Sensation Testing Intact to touch and monofilament testing bilaterally: Yes Pulse Check See comments: Yes Comments 1+ pedal pulses    EKG reviewed   Assessment and Plan :   Encounter Diagnoses  Name Primary?   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   Topics typically  include healthy lifestyle, diet, exercise, preventative care, vaccinations, sick and well care, proper use of emergency dept and after hours care, as well as other concerns.     Recommendations: Continue to return yearly for your annual wellness and preventative care visits.  This gives [place] a chance to discuss healthy lifestyle, exercise, vaccinations, review your chart record, and perform screenings where appropriate.  I recommend you see your eye doctor yearly for routine vision care.  I recommend you see your dentist yearly for routine dental care including hygiene visits twice yearly.   Vaccination recommendations were reviewed Immunization History  Administered Date(s) Administered   Influenza,inj,Quad PF,6+ Mos 12/23/2016, 12/27/2017, 01/02/2019, 12/13/2019, 12/30/2020   Influenza-Unspecified 02/13/2013, 01/05/2019, 01/05/2020   Moderna Sars-Covid-2 Vaccination 07/29/2019, 08/26/2019   Pneumococcal Conjugate-13 12/27/2017   Pneumococcal Polysaccharide-23 05/04/2019   Tdap 12/23/2016   Zoster Recombinat (Shingrix) 12/13/2019, 04/26/2020    I recommend a yearly flu shot. Declines flu shot.  I recommend updated covid booster at the pharmacy   Screening for cancer: Colon cancer screening: I reviewed your colonoscopy on file that is up to date from 2021  We discussed PSA, prostate exam, and prostate cancer screening risks/benefits.     Skin cancer screening: Check your skin regularly for new changes, growing lesions, or other lesions of concern Come in for evaluation if you have skin lesions of concern.  Lung cancer screening: If you have a greater than 20 pack year history of tobacco use, then you may qualify for lung cancer screening with a chest CT scan.   Please call your insurance company to inquire about coverage for this test.  We currently don't have screenings for other cancers besides breast, cervical, colon, and lung cancers.  If you have a strong family history of  cancer or have other cancer screening concerns, please let me know.    Bone health: Get at least 150 minutes of aerobic exercise weekly Get weight bearing exercise at least once weekly Bone density test:  A bone density test is an imaging test that uses a type of X-ray to measure the amount of calcium and other minerals in your bones. The test may be used to diagnose or screen you for a condition that causes weak or thin bones (osteoporosis), predict your risk for a broken bone (fracture), or determine how well your osteoporosis treatment is working. The bone density test is recommended for females 41 and older, or females or males <65 if certain risk factors such as thyroid disease, long term use of steroids such as for asthma or rheumatological issues, vitamin D deficiency, estrogen deficiency, family history of osteoporosis, self or family history of fragility fracture in first degree relative.    Heart health: Get at least 150 minutes of aerobic exercise weekly Limit alcohol It is important to maintain a healthy blood pressure and healthy cholesterol numbers  Heart disease  screening: Screening for heart disease includes screening for blood pressure, fasting lipids, glucose/diabetes screening, BMI height to weight ratio, reviewed of smoking status, physical activity, and diet.    Goals include blood pressure 120/80 or less, maintaining a healthy lipid/cholesterol profile, preventing diabetes or keeping diabetes numbers under good control, not smoking or using tobacco products, exercising most days per week or at least 150 minutes per week of exercise, and eating healthy variety of fruits and vegetables, healthy oils, and avoiding unhealthy food choices like fried food, fast food, high sugar and high cholesterol foods.    Other tests may possibly include EKG test, CT coronary calcium score, echocardiogram, exercise treadmill stress test.   Baseline EKG today  Consider CT coronary  test   Medical care options: I recommend you continue to seek care here first for routine care.  We try really hard to have available appointments Monday through Friday daytime hours for sick visits, acute visits, and physicals.  Urgent care should be used for after hours and weekends for significant issues that cannot wait till the next day.  The emergency department should be used for significant potentially life-threatening emergencies.  The emergency department is expensive, can often have long wait times for less significant concerns, so try to utilize primary care, urgent care, or telemedicine when possible to avoid unnecessary trips to the emergency department.  Virtual visits and telemedicine have been introduced since the pandemic started in 2020, and can be convenient ways to receive medical care.  We offer virtual appointments as well to assist you in a variety of options to seek medical care.   Advanced Directives: I recommend you consider completing a [place] and Living Will.   These documents respect your wishes and help alleviate burdens on your loved ones if you were to become terminally ill or be in a position to need those documents enforced.    You can complete Advanced Directives yourself, have them notarized, then have copies made for our office, for you and for anybody you feel should have them in safe keeping.  Or, you can have an attorney prepare these documents.   If you haven't updated your Last Will and Testament in a while, it may be worthwhile having an attorney prepare these documents together and save on some costs.       Separate significant issues discussed: Hyperlipidemia-continue atorvastatin 10 mg, labs today  Kidney protection-continue enalapril 5 mg daily  Diabetes - pending labs I can give recommendations.  We will likely continue Jardiance, will likely increased Metformin.  You wish to discontinue Trulcity at this time.  Follow up  pending labs  Anxiety and depression - continue Wellbutrin and Lexapro   Colyn was seen today for fasting cpe.  Diagnoses and all orders for this visit:  Encounter for health maintenance examination in adult -     Comprehensive metabolic panel -     CBC -     Lipid panel -     PSA -     Hemoglobin A1c -     Microalbumin/Creatinine Ratio, Urine -     POCT Urinalysis DIP (Proadvantage Device) -     EKG 12-Lead  Vitamin D deficiency  Type 2 diabetes mellitus with mild nonproliferative retinopathy without macular edema, unspecified laterality, unspecified whether long term insulin use (HCC) -     Hemoglobin A1c -     Microalbumin/Creatinine Ratio, Urine -     CT CARDIAC SCORING ([facility] LOCATIONS ONLY); Future  Mild sleep apnea  Non-seasonal allergic rhinitis, unspecified trigger  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

## 2022-01-02 ENCOUNTER — Other Ambulatory Visit: Payer: Self-pay | Admitting: Medical

## 2022-01-02 LAB — CBC
Hematocrit: 50 % (ref 37.5–51.0)
Hemoglobin: 17.6 g/dL (ref 13.0–17.7)
MCH: 32.4 pg (ref 26.6–33.0)
MCHC: 35.2 g/dL (ref 31.5–35.7)
MCV: 92 fL (ref 79–97)
Platelets: 248 10*3/uL (ref 150–450)
RBC: 5.44 x10E6/uL (ref 4.14–5.80)
RDW: 12.6 % (ref 11.6–15.4)
WBC: 6.1 10*3/uL (ref 3.4–10.8)

## 2022-01-02 LAB — COMPREHENSIVE METABOLIC PANEL
ALT: 27 IU/L (ref 0–44)
AST: 18 IU/L (ref 0–40)
Albumin/Globulin Ratio: 2.7 — ABNORMAL HIGH (ref 1.2–2.2)
Albumin: 5.2 g/dL — ABNORMAL HIGH (ref 3.8–4.9)
Alkaline Phosphatase: 100 IU/L (ref 44–121)
BUN/Creatinine Ratio: 20 (ref 9–20)
BUN: 18 mg/dL (ref 6–24)
Bilirubin Total: 0.4 mg/dL (ref 0.0–1.2)
CO2: 22 mmol/L (ref 20–29)
Calcium: 9.6 mg/dL (ref 8.7–10.2)
Chloride: 99 mmol/L (ref 96–106)
Creatinine, Ser: 0.91 mg/dL (ref 0.76–1.27)
Globulin, Total: 1.9 g/dL (ref 1.5–4.5)
Glucose: 207 mg/dL — ABNORMAL HIGH (ref 70–99)
Potassium: 4.7 mmol/L (ref 3.5–5.2)
Sodium: 139 mmol/L (ref 134–144)
Total Protein: 7.1 g/dL (ref 6.0–8.5)
eGFR: 100 mL/min/{1.73_m2} (ref >59)

## 2022-01-02 LAB — LIPID PANEL
Chol/HDL Ratio: 4.7 ratio (ref 0.0–5.0)
Cholesterol, Total: 154 mg/dL (ref 100–199)
HDL: 33 mg/dL — ABNORMAL LOW (ref >39)
LDL Chol Calc (NIH): 98 mg/dL (ref 0–99)
Triglycerides: 126 mg/dL (ref 0–149)
VLDL Cholesterol Cal: 23 mg/dL (ref 5–40)

## 2022-01-02 LAB — MICROALBUMIN / CREATININE URINE RATIO
Creatinine, Urine: 29.8 mg/dL
Microalb/Creat Ratio: 15 mg/g creat (ref 0–29)
Microalbumin, Urine: 4.4 ug/mL

## 2022-01-02 LAB — PSA: Prostate Specific Ag, Serum: 2.3 ng/mL (ref 0.0–4.0)

## 2022-01-02 LAB — HEMOGLOBIN A1C
Est. average glucose Bld gHb Est-mCnc: 200 mg/dL
Hgb A1c MFr Bld: 8.6 % — ABNORMAL HIGH (ref 4.8–5.6)

## 2022-01-02 MED ORDER — LINAGLIPTIN-METFORMIN HCL 2.5-850 MG PO TABS: 1.0000 | ORAL_TABLET | Freq: Two times a day (BID) | ORAL | 0 refills | Status: DC

## 2022-01-02 MED ORDER — ESCITALOPRAM OXALATE 20 MG PO TABS: 20.0000 mg | ORAL_TABLET | Freq: Every day | ORAL | 1 refills | Status: DC

## 2022-01-06 ENCOUNTER — Encounter (INDEPENDENT_AMBULATORY_CARE_PROVIDER_SITE_OTHER): Payer: No Typology Code available for payment source | Admitting: Ophthalmology

## 2022-01-06 DIAGNOSIS — I10 Essential (primary) hypertension: Secondary | ICD-10-CM

## 2022-01-06 DIAGNOSIS — H25813 Combined forms of age-related cataract, bilateral: Secondary | ICD-10-CM

## 2022-01-06 DIAGNOSIS — H35033 Hypertensive retinopathy, bilateral: Secondary | ICD-10-CM

## 2022-01-06 DIAGNOSIS — E113293 Type 2 diabetes mellitus with mild nonproliferative diabetic retinopathy without macular edema, bilateral: Secondary | ICD-10-CM

## 2022-01-13 ENCOUNTER — Encounter: Payer: Self-pay | Admitting: Internal Medicine

## 2022-01-20 ENCOUNTER — Encounter (INDEPENDENT_AMBULATORY_CARE_PROVIDER_SITE_OTHER): Payer: No Typology Code available for payment source | Admitting: Ophthalmology

## 2022-01-20 DIAGNOSIS — H35033 Hypertensive retinopathy, bilateral: Secondary | ICD-10-CM

## 2022-01-20 DIAGNOSIS — H25813 Combined forms of age-related cataract, bilateral: Secondary | ICD-10-CM

## 2022-01-20 DIAGNOSIS — E113293 Type 2 diabetes mellitus with mild nonproliferative diabetic retinopathy without macular edema, bilateral: Secondary | ICD-10-CM

## 2022-01-20 DIAGNOSIS — I10 Essential (primary) hypertension: Secondary | ICD-10-CM

## 2022-02-27 ENCOUNTER — Other Ambulatory Visit: Payer: Self-pay | Admitting: Medical

## 2022-02-27 DIAGNOSIS — K219 Gastro-esophageal reflux disease without esophagitis: Secondary | ICD-10-CM

## 2022-03-02 NOTE — Progress Notes (Signed)
[facility] Note  03/10/2022    CHIEF COMPLAINT Patient presents for Retina Follow Up    HISTORY OF PRESENT ILLNESS: Jacob Hart is a 56 y.o. male who presents to the clinic today for:   HPI     Retina Follow Up   Patient presents with  Diabetic Retinopathy.  In both eyes.  This started years ago.  Duration of 6 months.  I, the attending physician,  performed the HPI with the patient and updated documentation appropriately.        Comments   Patient feels that the vision is the same. He is not using any eye drops at this time. There has been no changes in his general health. His A1C is 8 and his blood sugar was 128.      Last edited by Bernarda Caffey, MD on 03/11/2022  8:46 AM.    Pt states vision is fine, he has to wear reading glasses, he states his A1c is up bc of his eating habits, it was 8.6 on 09.23.23   Referring physician: Irene Pap, PA-C No address on file  HISTORICAL INFORMATION:   Selected notes from the MEDICAL RECORD NUMBER Referred by [name], NP-C for DM exam LEE:  Ocular Hx- PMH-DM (last A1C: 7.0, taking jardiance, metformin) HTN, anxiety, depression    CURRENT MEDICATIONS: No current outpatient medications on file. (Ophthalmic Drugs)   No current facility-administered medications for this visit. (Ophthalmic Drugs)   Current Outpatient Medications (Other)  Medication Sig   atorvastatin (LIPITOR) 10 MG tablet Take 1 tablet (10 mg total) by mouth daily.   buPROPion ER (WELLBUTRIN SR) 100 MG 12 hr tablet Take 1 tablet (100 mg total) by mouth daily.   empagliflozin (JARDIANCE) 25 MG TABS tablet TAKE 1 TABLET (25 MG TOTAL) BY MOUTH DAILY BEFORE BREAKFAST.   enalapril (VASOTEC) 5 MG tablet Take 1 tablet (5 mg total) by mouth daily.   escitalopram (LEXAPRO) 20 MG tablet Take 1 tablet (20 mg total) by mouth daily.   Ginkgo Biloba 40 MG TABS Take 1 tablet by mouth in the morning and at bedtime.    linaGLIPtin-metFORMIN HCl 2.5-850 MG TABS Take 1 tablet by mouth in the morning and at bedtime.   omeprazole (PRILOSEC) 40 MG capsule Take 1 capsule by mouth once daily   No current facility-administered medications for this visit. (Other)   REVIEW OF SYSTEMS: ROS   Positive for: Neurological, Endocrine, Eyes Negative for: Constitutional, Gastrointestinal, Skin, Genitourinary, Musculoskeletal, HENT, Cardiovascular, Respiratory, Psychiatric, Allergic/Imm, Heme/Lymph Last edited by Annie Paras, COT on 03/10/2022  8:06 AM.     ALLERGIES Allergies  Allergen Reactions   Sulfa Antibiotics Hives   PAST MEDICAL HISTORY Past Medical History:  Diagnosis Date   Allergy    Anxiety and depression 12/23/2016   Controlled type 2 diabetes mellitus without complication, without long-term current use of insulin ([place]) 12/23/2016   GERD (gastroesophageal reflux disease) 12/23/2016   History of esophageal stricture    dilation done in 2016 at [facility]   Hypertension associated with diabetes ([place]) 12/23/2016   Substance abuse ([place])    Uncontrolled diabetes mellitus type 2 without complications 3/81/0175   Past Surgical History:  Procedure Laterality Date   ABDOMINAL SURGERY     as a child   ESOPHAGOGASTRODUODENOSCOPY  08/02/2014   mild chronic nonspecific gastritis, stomach and endoscopic biopises. reflux esophagitis   ESOPHAGOGASTRODUODENOSCOPY (EGD) WITH ESOPHAGEAL DILATION     mild chronic  stomach gastritis per record in 2016   NASAL SEPTUM SURGERY     TONSILLECTOMY     VASECTOMY     FAMILY HISTORY Family History  Problem Relation Age of Onset   Lung cancer Mother 78   Prostate cancer Father 40   Bladder Cancer Father    Lung cancer Maternal Grandfather    Colon cancer Paternal Grandmother    CAD Brother    SOCIAL HISTORY Social History   Tobacco Use   Smoking status: Never   Smokeless tobacco: Current    Types: Snuff  Vaping Use   Vaping Use: Never used  Substance  Use Topics   Alcohol use: No    Comment: recovering alcoholic, 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   Drug use: No       OPHTHALMIC EXAM: Base Eye Exam     Visual Acuity (Snellen - Linear)       Right Left   Dist [place] 20/25 +2 20/25         Tonometry (Tonopen, 8:09 AM)       Right Left   Pressure 10 13         Pupils       Dark Light Shape React APD   Right 4 3 Round Brisk None   Left 4 3 Round Brisk None         Visual Fields       Left Right    Full Full         Extraocular Movement       Right Left    Full, Ortho Full, Ortho         Neuro/Psych     Oriented x3: Yes   Mood/Affect: Normal         Dilation     Both eyes: 1.0% Mydriacyl, 2.5% Phenylephrine @ 8:07 AM           Slit Lamp and Fundus Exam     Slit Lamp Exam       Right Left   Lids/Lashes Dermatochalasis - upper lid Dermatochalasis - upper lid   Conjunctiva/Sclera White and quiet White and quiet   Cornea Mild arcus Mild arcus, mild tear film debris   Anterior Chamber Deep and quiet Deep and quiet   Iris Round and dilated, No NVI Round and dilated, No NVI   Lens 2+ Nuclear sclerosis, 2+ Cortical cataract 2+ Nuclear sclerosis, 2+ Cortical cataract   Anterior Vitreous Vitreous syneresis Vitreous syneresis         Fundus Exam       Right Left   Disc Tilted disc, Temporal Peripapillary atrophy, Pink and Sharp Tilted disc, Temporal Peripapillary atrophy, Pink and Sharp   C/D Ratio 0.5 0.6   Macula Flat, blunted foveal reflex, focal cluster of DBH and thickening ST mac, mild Retinal pigment epithelial mottling, scattered Microaneurysms blunted foveal reflex, scattered MA, focal punctate exudates and cystic changes temporal macula   Vessels mild tortuosity, no NV attenuated, mild tortuosity   Periphery Attached, scattered MA's Attached, scattered MA            IMAGING AND PROCEDURES  Imaging and Procedures for _0 @  OCT, Retina - OU - Both Eyes       Right Eye Quality was good.  Central Foveal Thickness: 321. Progression has been stable. Findings include normal foveal contour, no IRF, no SRF, vitreomacular adhesion (Focal IRHM and retinal thickening ST mac).   Left Eye Quality was good. Central Foveal Thickness: 321. Progression has worsened.  Findings include normal foveal contour, no SRF, intraretinal hyper-reflective material, intraretinal fluid, vitreomacular adhesion (Blunted foveal contour, mild interval increase in cystic changes temporal fovea).   Notes *Images captured and stored on drive  Diagnosis / Impression:  OD: Focal IRHM and retinal thickening ST mac OS: Blunted foveal contour, mild interval increase in cystic changes temporal fovea  Clinical management:  See below  Abbreviations: NFP - Normal foveal profile. CME - cystoid macular edema. PED - pigment epithelial detachment. IRF - intraretinal fluid. SRF - subretinal fluid. EZ - ellipsoid zone. ERM - epiretinal membrane. ORA - outer retinal atrophy. ORT - outer retinal tubulation. SRHM - subretinal hyper-reflective material             ASSESSMENT/PLAN:    ICD-10-CM   1. Mild nonproliferative diabetic retinopathy of both eyes without macular edema associated with type 2 diabetes mellitus (HCC)  X10.6269 OCT, Retina - OU - Both Eyes    2. Essential hypertension  I10     3. Hypertensive retinopathy of both eyes  H35.033     4. Combined forms of age-related cataract of both eyes  H25.813      1. Mild Non-proliferative diabetic retinopathy OU  - delayed follow up from 6 months to 8 months on 12.05.23  - OS w/ mild DME/cystic changes temporal fovea/macula  - A1c: 8.6 on 09.23.23, 7.3 on 03.27.23; 6.6 on 09.22.22 - exam shows scattered MA OU; no NV - FA (11.18.22) shows no NV OU, OS with prominent MA temporal to fovea -- potential focal laser target - BCVA decreased to 20/25 from 20/20 OU - OCT shows: OD: Focal IRHM and retinal thickening ST mac; OS: Blunted foveal contour, mild interval  increase in cystic changes temporal fovea - no treatment recommended at this time - monitor - f/u in 6 months, DFE, OCT  2,3. Hypertensive retinopathy OU - discussed importance of tight BP control - monitor  4. Combined form age-related cataract OU - The symptoms of cataract, surgical options, and treatments and risks were discussed with patient. - discussed diagnosis and progression - not yet visually significant - monitor for now  Ophthalmic Meds Ordered this visit:  No orders of the defined types were placed in this encounter.    Return in about 6 months (around 09/09/2022) for f/u NPDR OU, DFE, OCT.  There are no Patient Instructions on file for this visit.   Explained the diagnoses, plan, and follow up with the patient and they expressed understanding.  Patient expressed understanding of the importance of proper follow up care.   This document serves as a record of services personally performed by Gardiner Sleeper, MD, PhD. It was created on their behalf by Renaldo Reel, [place] an ophthalmic technician. The creation of this record is the provider's dictation and/or activities during the visit.    Electronically signed by:  Renaldo Reel, COT  11.27.23 8:51 AM  This document serves as a record of services personally performed by Gardiner Sleeper, MD, PhD. It was created on their behalf by San Jetty. Owens Shark, OA an ophthalmic technician. The creation of this record is the provider's dictation and/or activities during the visit.    Electronically signed by: San Jetty. Owens Shark, [place] 12.05.2023 8:51 AM  Gardiner Sleeper, M.D., Ph.D. Diseases & Surgery of the Retina and Vitreous [facility]  I have reviewed the above documentation for accuracy and completeness, and I agree with the above. Gardiner Sleeper, M.D., Ph.D. 03/11/22 8:52 AM  Abbreviations: M myopia (nearsighted); A astigmatism; H hyperopia (farsighted); P presbyopia; Mrx spectacle prescription;  CTL  contact lenses; OD right eye; OS left eye; OU both eyes  XT exotropia; ET esotropia; PEK punctate epithelial keratitis; PEE punctate epithelial erosions; DES dry eye syndrome; MGD meibomian gland dysfunction; ATs artificial tears; PFAT's preservative free artificial tears; [place] nuclear sclerotic cataract; PSC posterior subcapsular cataract; ERM epi-retinal membrane; PVD posterior vitreous detachment; RD retinal detachment; DM diabetes mellitus; DR diabetic retinopathy; NPDR non-proliferative diabetic retinopathy; PDR proliferative diabetic retinopathy; CSME clinically significant macular edema; DME diabetic macular edema; dbh dot blot hemorrhages; CWS cotton wool spot; POAG primary open angle glaucoma; C/D cup-to-disc ratio; HVF humphrey visual field; GVF goldmann visual field; OCT optical coherence tomography; IOP intraocular pressure; BRVO Branch retinal vein occlusion; CRVO central retinal vein occlusion; CRAO central retinal artery occlusion; BRAO branch retinal artery occlusion; RT retinal tear; SB scleral buckle; PPV pars plana vitrectomy; VH Vitreous hemorrhage; PRP panretinal laser photocoagulation; IVK intravitreal kenalog; VMT vitreomacular traction; MH Macular hole;  NVD neovascularization of the disc; NVE neovascularization elsewhere; AREDS age related eye disease study; ARMD age related macular degeneration; POAG primary open angle glaucoma; EBMD epithelial/anterior basement membrane dystrophy; ACIOL anterior chamber intraocular lens; IOL intraocular lens; PCIOL posterior chamber intraocular lens; Phaco/IOL phacoemulsification with intraocular lens placement; [place] photorefractive keratectomy; LASIK laser assisted in situ keratomileusis; HTN hypertension; DM diabetes mellitus; COPD chronic obstructive pulmonary disease

## 2022-03-05 ENCOUNTER — Other Ambulatory Visit: Payer: Self-pay | Admitting: Internal Medicine

## 2022-03-05 DIAGNOSIS — K219 Gastro-esophageal reflux disease without esophagitis: Secondary | ICD-10-CM

## 2022-03-05 NOTE — Telephone Encounter (Signed)
Refilled this on 11/27

## 2022-03-10 ENCOUNTER — Ambulatory Visit (INDEPENDENT_AMBULATORY_CARE_PROVIDER_SITE_OTHER): Payer: 59 | Admitting: Ophthalmology

## 2022-03-10 DIAGNOSIS — E113293 Type 2 diabetes mellitus with mild nonproliferative diabetic retinopathy without macular edema, bilateral: Secondary | ICD-10-CM

## 2022-03-10 DIAGNOSIS — H35033 Hypertensive retinopathy, bilateral: Secondary | ICD-10-CM | POA: Diagnosis not present

## 2022-03-10 DIAGNOSIS — H25813 Combined forms of age-related cataract, bilateral: Secondary | ICD-10-CM

## 2022-03-10 DIAGNOSIS — I10 Essential (primary) hypertension: Secondary | ICD-10-CM

## 2022-03-11 ENCOUNTER — Encounter (INDEPENDENT_AMBULATORY_CARE_PROVIDER_SITE_OTHER): Payer: Self-pay | Admitting: Ophthalmology

## 2022-03-20 ENCOUNTER — Ambulatory Visit
Admission: RE | Admit: 2022-03-20 | Discharge: 2022-03-20 | Disposition: A | Discharge status: Home / Self Care | Payer: No Typology Code available for payment source | Source: Ambulatory Visit | Attending: Medical | Admitting: Medical

## 2022-03-20 DIAGNOSIS — E113299 Type 2 diabetes mellitus with mild nonproliferative diabetic retinopathy without macular edema, unspecified eye: Secondary | ICD-10-CM

## 2022-03-20 DIAGNOSIS — I152 Hypertension secondary to endocrine disorders: Secondary | ICD-10-CM

## 2022-03-20 DIAGNOSIS — Z136 Encounter for screening for cardiovascular disorders: Secondary | ICD-10-CM

## 2022-03-22 ENCOUNTER — Other Ambulatory Visit: Payer: Self-pay | Admitting: Medical

## 2022-03-22 NOTE — Progress Notes (Signed)
Results sent through MyChart

## 2022-03-25 ENCOUNTER — Telehealth: Payer: Self-pay | Admitting: Medical

## 2022-03-25 NOTE — Telephone Encounter (Signed)
Records request from Dept of [place] to [facility]

## 2022-04-07 ENCOUNTER — Ambulatory Visit: Payer: No Typology Code available for payment source | Admitting: Medical

## 2022-04-14 ENCOUNTER — Encounter: Payer: Self-pay | Admitting: Medical

## 2022-04-14 ENCOUNTER — Ambulatory Visit: Payer: 59 | Admitting: Medical

## 2022-04-14 VITALS — BP 120/70 | HR 79 | Temp 98.5°F | Wt 187.6 lb

## 2022-04-14 DIAGNOSIS — F419 Anxiety disorder, unspecified: Secondary | ICD-10-CM

## 2022-04-14 DIAGNOSIS — M25511 Pain in right shoulder: Secondary | ICD-10-CM | POA: Diagnosis not present

## 2022-04-14 DIAGNOSIS — E113299 Type 2 diabetes mellitus with mild nonproliferative diabetic retinopathy without macular edema, unspecified eye: Secondary | ICD-10-CM

## 2022-04-14 DIAGNOSIS — M79603 Pain in arm, unspecified: Secondary | ICD-10-CM

## 2022-04-14 DIAGNOSIS — M25611 Stiffness of right shoulder, not elsewhere classified: Secondary | ICD-10-CM

## 2022-04-14 DIAGNOSIS — F32A Depression, unspecified: Secondary | ICD-10-CM

## 2022-04-14 DIAGNOSIS — E1165 Type 2 diabetes mellitus with hyperglycemia: Secondary | ICD-10-CM

## 2022-04-14 DIAGNOSIS — M542 Cervicalgia: Secondary | ICD-10-CM

## 2022-04-14 DIAGNOSIS — Z2821 Immunization not carried out because of patient refusal: Secondary | ICD-10-CM

## 2022-04-14 DIAGNOSIS — E785 Hyperlipidemia, unspecified: Secondary | ICD-10-CM

## 2022-04-14 DIAGNOSIS — M25512 Pain in left shoulder: Secondary | ICD-10-CM

## 2022-04-14 DIAGNOSIS — E1159 Type 2 diabetes mellitus with other circulatory complications: Secondary | ICD-10-CM | POA: Diagnosis not present

## 2022-04-14 DIAGNOSIS — I152 Hypertension secondary to endocrine disorders: Secondary | ICD-10-CM

## 2022-04-14 DIAGNOSIS — R29898 Other symptoms and signs involving the musculoskeletal system: Secondary | ICD-10-CM

## 2022-04-14 DIAGNOSIS — G8929 Other chronic pain: Secondary | ICD-10-CM | POA: Insufficient documentation

## 2022-04-14 DIAGNOSIS — M25612 Stiffness of left shoulder, not elsewhere classified: Secondary | ICD-10-CM

## 2022-04-14 LAB — POCT GLYCOSYLATED HEMOGLOBIN (HGB A1C): Hemoglobin A1C: 8.5 % — AB (ref 4.0–5.6)

## 2022-04-14 MED ORDER — EMPAGLIFLOZIN 25 MG PO TABS: ORAL_TABLET | ORAL | 3 refills | Status: DC

## 2022-04-14 MED ORDER — TIRZEPATIDE 5 MG/0.5ML ~~LOC~~ SOAJ: 5.0000 mg | SUBCUTANEOUS | 1 refills | Status: DC

## 2022-04-14 MED ORDER — METFORMIN HCL 1000 MG PO TABS: 1000.0000 mg | ORAL_TABLET | Freq: Two times a day (BID) | ORAL | 1 refills | Status: DC

## 2022-04-14 MED ORDER — ENALAPRIL MALEATE 5 MG PO TABS: 5.0000 mg | ORAL_TABLET | Freq: Every day | ORAL | 3 refills | Status: DC

## 2022-04-14 MED ORDER — BUPROPION HCL ER (SR) 100 MG PO TB12: 100.0000 mg | ORAL_TABLET | Freq: Every day | ORAL | 3 refills | Status: DC

## 2022-04-14 MED ORDER — ATORVASTATIN CALCIUM 10 MG PO TABS: 10.0000 mg | ORAL_TABLET | Freq: Every day | ORAL | 3 refills | Status: DC

## 2022-04-14 MED ORDER — TIRZEPATIDE 2.5 MG/0.5ML ~~LOC~~ SOAJ: 2.5000 mg | SUBCUTANEOUS | 0 refills | Status: DC

## 2022-04-14 NOTE — Patient Instructions (Signed)
Diabetes Stop linagliptin metformin combo Change back to metformin 1000 mg twice daily Continue Jardiance 25 mg daily Continue to monitor blood sugars with goal less than 130 fasting Begin Mounjaro 2.5 mg weekly for 1 month then increase to 5 mg weekly injection Mounjaro is similar to [employer] and Trulicity, but works better  Hyperlipidemia-continue Lipitor 10 mg daily  Please go to [facility] for your neck and shoulder xrays.   Their hours are 8am - 4:30 pm Monday - Friday.  Take your insurance card with you.  [facility] [phone]  [address], [place] [address]  Your neck and shoulders have decreased range of motion, you have some pains and suggest possibly arthritis in the shoulders and neck but also could have a bulging disc or pinched nerve in the neck given some of your numbness and tingling in the arms and hands  I will likely refer you to physical therapy pending x-ray results

## 2022-04-14 NOTE — Progress Notes (Signed)
Subjective: Chief Complaint  Patient presents with   Diabetes    Diabetes check, shoulder pain on both shoulders getting worse,     Here for med check.  Diabetes-last visit hemoglobin A1c was over 8%.  We changed from plain metformin to linagliptin metformin and continue Jardiance.  However he has not seen really any significant change in blood sugars.  Still getting high numbers including a 200 fasting this morning.  He tries eat healthy but his wife brings home a lot of sweets.  Hyperlipidemia-compliant with Lipitor 10 mg daily  Continues on enalapril for kidney protection  He notes that he has tolerated metformin 1000 mg twice daily in the past would like to go back to this.  He has also been on Bydureon and Trulicity in the past.  He did not like the way Bydureon left nodules under the skin  He declines flu shot  Past Medical History:  Diagnosis Date   Allergy    Anxiety and depression 12/23/2016   Controlled type 2 diabetes mellitus without complication, without long-term current use of insulin ([place]) 12/23/2016   GERD (gastroesophageal reflux disease) 12/23/2016   History of esophageal stricture    dilation done in 2016 at [facility]   Hypertension associated with diabetes ([place]) 12/23/2016   Substance abuse ([place])    Uncontrolled diabetes mellitus type 2 without complications 12/08/8331   Current Outpatient Medications on File Prior to Visit  Medication Sig Dispense Refill   atorvastatin (LIPITOR) 10 MG tablet Take 1 tablet (10 mg total) by mouth daily. 90 tablet 0   buPROPion ER (WELLBUTRIN SR) 100 MG 12 hr tablet Take 1 tablet (100 mg total) by mouth daily. 90 tablet 0   empagliflozin (JARDIANCE) 25 MG TABS tablet TAKE 1 TABLET (25 MG TOTAL) BY MOUTH DAILY BEFORE BREAKFAST. 90 tablet 0   enalapril (VASOTEC) 5 MG tablet Take 1 tablet (5 mg total) by mouth daily. 90 tablet 0   escitalopram (LEXAPRO) 20 MG tablet Take 1 tablet (20 mg total) by mouth daily. 90 tablet 1   Ginkgo  Biloba 40 MG TABS Take 1 tablet by mouth in the morning and at bedtime.     linaGLIPtin-metFORMIN HCl 2.5-850 MG TABS Take 1 tablet by mouth in the morning and at bedtime. 180 tablet 0   omeprazole (PRILOSEC) 40 MG capsule Take 1 capsule by mouth once daily 90 capsule 0   No current facility-administered medications on file prior to visit.    ROS as in subjective    Objective: BP 120/70   Pulse 79   Temp 98.5 F (36.9 C)   Wt 187 lb 9.6 oz (85.1 kg)   BMI 26.54 kg/m   General appearence: alert, no distress, WD/WN,  Heart: RRR, normal S1, S2, no murmurs Lungs: CTA bilaterally, no wheezes, rhonchi, or rales Pulses: 2+ symmetric, upper and lower extremities, normal cap refill Neck with decreased extension but rest the range of motion normal, nontender no lymphadenopathy, no thyromegaly Bilateral shoulders and arms nontender in general, he has decreased range of motion of internal and external range of motion of both shoulders, no laxity, he is tight with some pain on apprehension test bilaterally, otherwise normal exam Arms bilaterally neurovascularly intact   Diabetic Foot Exam - Simple   Simple Foot Form Diabetic Foot exam was performed with the following findings: Yes 04/14/2022  9:36 AM  Visual Inspection See comments: Yes Sensation Testing Intact to touch and monofilament testing bilaterally: Yes Pulse Check Posterior Tibialis and Dorsalis  pulse intact bilaterally: Yes Comments Yellowish somewhat discolored toenails throughout bilateral feet      CT coronary cardiac score 03/22/22 IMPRESSION: 1. Patient's total coronary artery calcium score is 3 which is 46th percentile for patient's of matched age, gender and race/ethnicity. Please note that although the presence of coronary artery calcium documents the presence of coronary artery disease, the severity of this disease and any potential stenosis cannot be assessed on this noncontrast CT examination. Assessment for  potential risk factor modification, dietary therapy or pharmacologic therapy may be warranted, if clinically indicated. 2. 4 mm left upper lobe pulmonary nodule, nonspecific, but statistically likely benign. No follow-up needed if patient is low-risk.This recommendation follows the consensus statement:     Assessment: Encounter Diagnoses  Name Primary?   Type 2 diabetes mellitus with mild nonproliferative retinopathy without macular edema, unspecified laterality, unspecified whether long term insulin use (HCC) Yes   Hypertension associated with diabetes ([place])    Hyperlipidemia, unspecified hyperlipidemia type    Chronic pain of both shoulders    Decreased ROM of right shoulder    Decreased ROM of left shoulder    Arm pain, diffuse, unspecified laterality    Neck pain    Decreased ROM of neck    Influenza vaccination declined     Plan:  Diabetes Stop linagliptin metformin combo Change back to metformin 1000 mg twice daily Continue Jardiance 25 mg daily Continue to monitor blood sugars with goal less than 130 fasting Begin Mounjaro 2.5 mg weekly for 1 month then increase to 5 mg weekly injection Mounjaro is similar to [employer] and Trulicity, but works better  Hyperlipidemia-continue Lipitor 10 mg daily  Please go to [facility] for your neck and shoulder xrays.   Their hours are 8am - 4:30 pm Monday - Friday.  Take your insurance card with you.  [facility] [phone]  [address], [place] [address]  Your neck and shoulders have decreased range of motion, you have some pains and suggest possibly arthritis in the shoulders and neck but also could have a bulging disc or pinched nerve in the neck given some of your numbness and tingling in the arms and hands  I will likely refer you to physical therapy pending x-ray results   Tarry was seen today for diabetes.  Diagnoses and all orders for this visit:  Type 2 diabetes mellitus with mild  nonproliferative retinopathy without macular edema, unspecified laterality, unspecified whether long term insulin use (HCC) -     HgB A1c  Hypertension associated with diabetes ([place])  Hyperlipidemia, unspecified hyperlipidemia type  Chronic pain of both shoulders -     DG Shoulder Left; Future -     DG Shoulder Right; Future  Decreased ROM of right shoulder -     DG Shoulder Left; Future -     DG Shoulder Right; Future  Decreased ROM of left shoulder -     DG Shoulder Left; Future -     DG Shoulder Right; Future  Arm pain, diffuse, unspecified laterality -     DG Shoulder Left; Future -     DG Shoulder Right; Future  Neck pain -     DG Cervical Spine Complete; Future  Decreased ROM of neck -     DG Cervical Spine Complete; Future  Influenza vaccination declined  Other orders -     tirzepatide [facility]) 2.5 MG/0.5ML Pen; Inject 2.5 mg into the skin once a week. -     tirzepatide [facility])  5 MG/0.5ML Pen; Inject 5 mg into the skin once a week. -     metFORMIN (GLUCOPHAGE) 1000 MG tablet; Take 1 tablet (1,000 mg total) by mouth 2 (two) times daily with a meal.  F/u pending xrays, 73mo

## 2022-04-17 ENCOUNTER — Ambulatory Visit
Admission: RE | Admit: 2022-04-17 | Discharge: 2022-04-17 | Disposition: A | Discharge status: Home / Self Care | Payer: Self-pay | Source: Ambulatory Visit | Attending: Medical | Admitting: Medical

## 2022-04-17 DIAGNOSIS — M79603 Pain in arm, unspecified: Secondary | ICD-10-CM

## 2022-04-17 DIAGNOSIS — R29898 Other symptoms and signs involving the musculoskeletal system: Secondary | ICD-10-CM

## 2022-04-17 DIAGNOSIS — M25612 Stiffness of left shoulder, not elsewhere classified: Secondary | ICD-10-CM

## 2022-04-17 DIAGNOSIS — G8929 Other chronic pain: Secondary | ICD-10-CM

## 2022-04-17 DIAGNOSIS — M542 Cervicalgia: Secondary | ICD-10-CM

## 2022-04-17 DIAGNOSIS — M25611 Stiffness of right shoulder, not elsewhere classified: Secondary | ICD-10-CM

## 2022-04-20 NOTE — Progress Notes (Signed)
X-rays show possible ligament injury of the right AC joint and shoulder, age-related degenerative changes of the neck x-ray, cervical spine, left shoulder x-ray normal  I recommended a follow-up to discuss results further and consider other evaluation or possibly referral to orthopedics for next steps.  If he wants to go straight to orthopedics for further evaluation and management, has seen orthopedic before in [place] that he would like to see?

## 2022-04-21 ENCOUNTER — Telehealth: Payer: Self-pay | Admitting: Medical

## 2022-04-21 NOTE — Telephone Encounter (Signed)
Pt called back for results, You were with patients so I let him know you would try to call again when available

## 2022-04-22 NOTE — Telephone Encounter (Signed)
Patient advised of results.

## 2022-04-24 ENCOUNTER — Ambulatory Visit: Payer: 59 | Admitting: Medical

## 2022-04-24 ENCOUNTER — Encounter: Payer: Self-pay | Admitting: Medical

## 2022-04-24 VITALS — BP 120/60 | HR 80 | Ht 70.0 in | Wt 185.2 lb

## 2022-04-24 DIAGNOSIS — M25512 Pain in left shoulder: Secondary | ICD-10-CM

## 2022-04-24 DIAGNOSIS — R202 Paresthesia of skin: Secondary | ICD-10-CM

## 2022-04-24 DIAGNOSIS — G8929 Other chronic pain: Secondary | ICD-10-CM | POA: Insufficient documentation

## 2022-04-24 DIAGNOSIS — M542 Cervicalgia: Secondary | ICD-10-CM | POA: Diagnosis not present

## 2022-04-24 DIAGNOSIS — M25511 Pain in right shoulder: Secondary | ICD-10-CM

## 2022-04-24 DIAGNOSIS — R200 Anesthesia of skin: Secondary | ICD-10-CM

## 2022-04-24 NOTE — Progress Notes (Signed)
Subjective: Chief Complaint  Patient presents with   Follow-up    Here to go over xrays results and discuss.    Here for recheck on joint pains.  He had xrays recently.  He notes ongoing right shoulder pains, left shoulder and upper arm pains, feels like something is sticking into left shoulder . Neck hurts all the time.  Has had these pains for at least 2 years, worse in past month.   No specific injury or trauma or arms or neck.   Does get numbness or left index and middle finger intermittent, and gets spasms of both arms.   Sees chiropractor for these symptoms so far.   If lying on the right side hurts shoulder, and can only lie that way for just a few minutes.  No other aggravating or relieving factors. No other complaint.  Past Medical History:  Diagnosis Date   Allergy    Anxiety and depression 12/23/2016   Controlled type 2 diabetes mellitus without complication, without long-term current use of insulin ([place]) 12/23/2016   GERD (gastroesophageal reflux disease) 12/23/2016   History of esophageal stricture    dilation done in 2016 at [facility]   Hypertension associated with diabetes ([place]) 12/23/2016   Substance abuse ([place])    Uncontrolled diabetes mellitus type 2 without complications 7/82/9562   Current Outpatient Medications on File Prior to Visit  Medication Sig Dispense Refill   atorvastatin (LIPITOR) 10 MG tablet Take 1 tablet (10 mg total) by mouth daily. 90 tablet 3   buPROPion ER (WELLBUTRIN SR) 100 MG 12 hr tablet Take 1 tablet (100 mg total) by mouth daily. 90 tablet 3   empagliflozin (JARDIANCE) 25 MG TABS tablet TAKE 1 TABLET (25 MG TOTAL) BY MOUTH DAILY BEFORE BREAKFAST. 90 tablet 3   enalapril (VASOTEC) 5 MG tablet Take 1 tablet (5 mg total) by mouth daily. 90 tablet 3   escitalopram (LEXAPRO) 20 MG tablet Take 1 tablet (20 mg total) by mouth daily. 90 tablet 1   Ginkgo Biloba 40 MG TABS Take 1 tablet by mouth in the morning and at bedtime.     metFORMIN (GLUCOPHAGE) 1000  MG tablet Take 1 tablet (1,000 mg total) by mouth 2 (two) times daily with a meal. 180 tablet 1   omeprazole (PRILOSEC) 40 MG capsule Take 1 capsule by mouth once daily 90 capsule 0   tirzepatide (MOUNJARO) 2.5 MG/0.5ML Pen Inject 2.5 mg into the skin once a week. 2 mL 0   No current facility-administered medications on file prior to visit.   Past Surgical History:  Procedure Laterality Date   ABDOMINAL SURGERY     as a child   ESOPHAGOGASTRODUODENOSCOPY  08/02/2014   mild chronic nonspecific gastritis, stomach and endoscopic biopises. reflux esophagitis   ESOPHAGOGASTRODUODENOSCOPY (EGD) WITH ESOPHAGEAL DILATION     mild chronic stomach gastritis per record in 2016   NASAL SEPTUM SURGERY     TONSILLECTOMY     VASECTOMY       ROS as in subjective    Objective: BP 120/60   Pulse 80   Ht '5\' 10"'$  (1.778 m)   Wt 185 lb 3.2 oz (84 kg)   BMI 26.57 kg/m   General appearence: alert, no distress, WD/WN,  Neck nontender, decreased extension in general, flexion okay, slightly decreased left and right rotation, no mass thyromegaly or lymphadenopathy Back nontender MSK: Both shoulders nontender to palpation, no obvious swelling or deformity, both shoulders with some pain with apprehension testing crossover test, external  range of motion slightly reduced but significantly more reduced internal range of motion of both shoulders, no laxity, no obvious rotator cuff weakness, rest of arms unremarkable Arms neurovascularly intact     Assessment: Encounter Diagnoses  Name Primary?   Chronic neck pain Yes   Chronic pain of both shoulders    Numbness and tingling in left hand        Plan:  We reviewed his recent x-rays of both shoulders and neck.  Possible ligament injury right shoulder, degenerative changes of cervical spine.  We discussed possible next steps.  Referral to physical therapy at this time.  He is getting no benefit with NSAIDs or Tylenol.  He declines muscle  relaxer.  Jacob Hart was seen today for follow-up.  Diagnoses and all orders for this visit:  Chronic neck pain  Chronic pain of both shoulders  Numbness and tingling in left hand    F/u pending PT, 4 wk

## 2022-05-07 ENCOUNTER — Other Ambulatory Visit: Payer: Self-pay | Admitting: Medical

## 2022-05-19 ENCOUNTER — Ambulatory Visit
Admission: EM | Admit: 2022-05-19 | Discharge: 2022-05-19 | Disposition: A | Discharge status: Home / Self Care | Payer: 59 | Attending: Nurse Practitioner | Admitting: Nurse Practitioner

## 2022-05-19 DIAGNOSIS — J22 Unspecified acute lower respiratory infection: Secondary | ICD-10-CM | POA: Diagnosis present

## 2022-05-19 DIAGNOSIS — J029 Acute pharyngitis, unspecified: Secondary | ICD-10-CM

## 2022-05-19 DIAGNOSIS — R059 Cough, unspecified: Secondary | ICD-10-CM

## 2022-05-19 LAB — POCT RAPID STREP A (OFFICE): Rapid Strep A Screen: NEGATIVE

## 2022-05-19 MED ORDER — AMOXICILLIN-POT CLAVULANATE 875-125 MG PO TABS: 1.0000 | ORAL_TABLET | Freq: Two times a day (BID) | ORAL | 0 refills | Status: DC

## 2022-05-19 MED ORDER — BENZONATATE 100 MG PO CAPS: 100.0000 mg | ORAL_CAPSULE | Freq: Three times a day (TID) | ORAL | 0 refills | Status: DC | PRN

## 2022-05-19 NOTE — ED Triage Notes (Signed)
Pt reports cough and sore throat x 10 days; fever 102.0 F last night. Cough is worse when lay down; sore throat is worse when coughing.  Advil and Theraflu gives some relief. Grandfather had Strep 1 week ago.

## 2022-05-19 NOTE — ED Provider Notes (Signed)
[facility]    CSN: [id number] Arrival date & time: 05/19/22  1007      History   Chief Complaint Chief Complaint  Patient presents with   Appointment    1030   Cough    HPI Jacob Hart is a 57 y.o. male.   The history is provided by the patient and the spouse.   The patient presents for complaints of fever, cough, and sore throat.  Patient states his last fever was last evening and was high as 102.  He states over the last several days, his cough has worsened.  He states that his cough is worse when he is lying down.  He states that there is something "coating his throat and feels like it strangling him".  Patient's wife reports that the patient does have a chronic cough, but it is a "mild cough".  She states that patient seemed to be getting better over the last week, but then suddenly his symptoms worsen.  Patient states when he coughs, his throat becomes more sore.  He states that he does have a history of reflux disease, but takes omeprazole daily.  Patient denies headache, ear pain, nasal congestion, runny nose, wheezing, shortness of breath, difficulty breathing, or GI symptoms.  Patient reports he has been taking over-the-counter Mucinex for his symptoms with minimal relief.  Patient also reports that his granddaughter was diagnosed with strep throat over the last week.  Patient with history of diabetes.  He states his last A1c was around 9.  Past Medical History:  Diagnosis Date   Allergy    Anxiety and depression 12/23/2016   Controlled type 2 diabetes mellitus without complication, without long-term current use of insulin ([place]) 12/23/2016   GERD (gastroesophageal reflux disease) 12/23/2016   History of esophageal stricture    dilation done in 2016 at [facility]   Hypertension associated with diabetes ([place]) 12/23/2016   Substance abuse ([place])    Uncontrolled diabetes mellitus type 2 without complications [id number]    Patient Active Problem List    Diagnosis Date Noted   Chronic neck pain 04/24/2022   Numbness and tingling in left hand 04/24/2022   Chronic pain of both shoulders 04/14/2022   Decreased ROM of right shoulder 04/14/2022   Arm pain, diffuse, unspecified laterality 04/14/2022   Decreased ROM of neck 04/14/2022   Decreased ROM of left shoulder 04/14/2022   Neck pain 04/14/2022   Encounter for health maintenance examination in adult 01/01/2022   Influenza vaccination declined 01/01/2022   Screening for prostate cancer 01/01/2022   Screening for heart disease 01/01/2022   Hyperlipidemia 06/30/2021   GERD without esophagitis 06/12/2021   Non-seasonal allergic rhinitis 06/12/2021   Vitamin D deficiency 12/30/2020   Chronic cough 05/04/2019   History of esophageal dilatation 05/04/2019   Erectile dysfunction 01/06/2019   Mild sleep apnea 01/02/2019   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 reflux esophagitis   ESOPHAGOGASTRODUODENOSCOPY (EGD) WITH ESOPHAGEAL DILATION     mild chronic stomach gastritis per record in 2016   [place]  VASECTOMY         Home Medications    Prior to Admission medications   Medication Sig Start Date End Date Taking? Authorizing Provider  amoxicillin-clavulanate (AUGMENTIN) 875-125 MG tablet Take 1 tablet by mouth every 12 (twelve) hours. 05/19/22  Yes Cynthia Stainback-Warren, Alda Lea, NP  benzonatate (TESSALON) 100 MG capsule Take 1 capsule (100 mg total) by mouth 3 (three) times daily as needed for cough. 05/19/22  Yes  Brittian Renaldo-Warren, Alda Lea, NP  atorvastatin (LIPITOR) 10 MG tablet Take 1 tablet (10 mg total) by mouth daily. 04/14/22   Tysinger, Camelia Eng, PA-C  buPROPion ER [facility] SR) 100 MG 12 hr tablet Take 1 tablet (100 mg total) by mouth daily. 04/14/22   Tysinger, Camelia Eng, PA-C  empagliflozin (JARDIANCE) 25 MG TABS tablet TAKE 1 TABLET (25 MG TOTAL) BY MOUTH DAILY BEFORE BREAKFAST. 04/14/22   Tysinger, Camelia Eng, PA-C  enalapril (VASOTEC) 5 MG tablet Take 1 tablet (5 mg total) by mouth daily. 04/14/22   Tysinger, Camelia Eng, PA-C  escitalopram (LEXAPRO) 20 MG tablet Take 1 tablet (20 mg total) by mouth daily. 01/02/22   Tysinger, Camelia Eng, PA-C  Ginkgo Biloba 40 MG TABS Take 1 tablet by mouth in the morning and at bedtime.    [provider]  metFORMIN (GLUCOPHAGE) 1000 MG tablet Take 1 tablet (1,000 mg total) by mouth 2 (two) times daily with a meal. 04/14/22   Tysinger, Camelia Eng, PA-C  MOUNJARO 2.5 MG/0.5ML Pen INJECT 2.5 MG SUBCUTANEOUSLY ONCE A WEEK 05/08/22   Tysinger, Camelia Eng, PA-C  omeprazole (PRILOSEC) 40 MG capsule Take 1 capsule by mouth once daily 03/02/22   Tysinger, Camelia Eng, PA-C    Family History Family History  Problem Relation Age of Onset   Lung cancer Mother 78   Prostate cancer Father 76   Bladder Cancer Father    Lung cancer Maternal Grandfather    Colon cancer Paternal Grandmother    CAD Brother     Social History Social History   Tobacco Use   Smoking status: Never   Smokeless tobacco: Current    Types: Snuff  Vaping Use   Vaping Use: Never used  Substance Use Topics   Alcohol use: No    Comment: recovering alcoholic, 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 --      Excl. in [employer]? --    No data found.  Updated Vital Signs BP 107/69 (BP Location: Right Arm)   Pulse 88   Temp 98.3 F (36.8 C) (Oral)   Resp 18   SpO2 93%   Visual Acuity Right Eye Distance:   Left Eye Distance:   Bilateral Distance:  Right Eye Near:   Left Eye Near:    Bilateral Near:     Physical Exam Vitals and nursing note reviewed.  Constitutional:      General: He is not in acute distress.    Appearance: He is well-developed.  HENT:     Head: Normocephalic and atraumatic.     Right Ear: Tympanic membrane, ear canal and external ear normal.     Left Ear: Tympanic membrane, ear canal and external ear normal.     Nose: Congestion present.     Mouth/Throat:     Pharynx: Posterior oropharyngeal erythema present. No oropharyngeal exudate.  Eyes:     Extraocular Movements: Extraocular movements intact.     Conjunctiva/sclera: Conjunctivae normal.     Pupils: Pupils are equal, round, and reactive to light.  Neck:     Thyroid: No thyromegaly.     Trachea: No tracheal deviation.  Cardiovascular:     Rate and Rhythm: Normal rate and regular rhythm.     Pulses: Normal pulses.     Heart sounds: Normal heart sounds.  Pulmonary:     Effort: Pulmonary effort is normal.     Breath sounds: Normal breath sounds.  Abdominal:     General: Bowel sounds are normal. There is no distension.     Palpations: Abdomen is soft.     Tenderness: There is no abdominal tenderness.  Musculoskeletal:     Cervical back: Normal range of motion.  Lymphadenopathy:     Cervical: No cervical adenopathy.  Skin:    General: Skin is warm and dry.  Neurological:     General: No focal deficit present.     Mental Status: He is alert and oriented to person, place, and time.  Psychiatric:        Mood and Affect: Mood  normal.        Behavior: Behavior normal.        Thought Content: Thought content normal.        Judgment: Judgment normal.      UC Treatments / Results  Labs (all labs ordered are listed, but only abnormal results are displayed) Labs Reviewed  POCT RAPID STREP A (OFFICE)    EKG   Radiology No results found.  Procedures Procedures (including critical care time)  Medications Ordered in UC Medications - No data to display  Initial Impression / Assessment and Plan / UC Course  I have reviewed the triage vital signs and the nursing notes.  Pertinent labs & imaging results that were available during my care of the patient were reviewed by me and considered in my medical decision making (see chart for details).  The patient is well-appearing, he is in no acute distress, vital signs are stable.  Rapid strep test is negative.  Lung sounds are clear throughout on exam, patient does have a history of a chronic cough, but states this is "different".  Will treat patient with Augmentin 875/125 mg for a bacterial upper respiratory infection.  Patient with presence of fever, and with rebounding of symptoms.  Due to his history of diabetes and most recent A1c of 9, will forego use of steroids at this time.  For the cough, will prescribe Tessalon Perles 100 mg to use up to 3 times daily as needed.  Supportive care recommendations were provided to the patient to include use of a humidifier in his bedroom at nighttime during sleep and sleeping elevated on pillows.  Patient is in agreement with this plan of care  and verbalizes understanding.  All questions were answered.  Patient is stable for discharge.   Final Clinical Impressions(s) / UC Diagnoses   Final diagnoses:  Lower respiratory infection  Cough, unspecified type  Sore throat     Discharge Instructions      The rapid strep test is negative.  A throat culture is pending.  You will be contacted if the pending test result is  positive. Take medication as prescribed. Increase fluids and allow for plenty of rest. Recommend using a humidifier in your bedroom at nighttime during sleep and sleeping elevated on pillows while cough symptoms persist. If you develop wheezing, shortness of breath, or difficulty breathing, please follow-up in the emergency department for further evaluation. Follow-up with your primary care physician within the next 7 to 10 days for reevaluation if symptoms fail to improve. Follow-up as needed.     ED Prescriptions     Medication Sig Dispense Auth. Provider   amoxicillin-clavulanate (AUGMENTIN) 875-125 MG tablet Take 1 tablet by mouth every 12 (twelve) hours. 14 tablet Symphani Eckstrom-Warren, Alda Lea, NP   benzonatate (TESSALON) 100 MG capsule Take 1 capsule (100 mg total) by mouth 3 (three) times daily as needed for cough. 30 capsule [place]-Warren, Alda Lea, NP      PDMP not reviewed this encounter.   Tish Men, NP 05/19/22 1759

## 2022-05-19 NOTE — Discharge Instructions (Addendum)
The rapid strep test is negative.  A throat culture is pending.  You will be contacted if the pending test result is positive. Take medication as prescribed. Increase fluids and allow for plenty of rest. Recommend using a humidifier in your bedroom at nighttime during sleep and sleeping elevated on pillows while cough symptoms persist. If you develop wheezing, shortness of breath, or difficulty breathing, please follow-up in the emergency department for further evaluation. Follow-up with your primary care physician within the next 7 to 10 days for reevaluation if symptoms fail to improve. Follow-up as needed.

## 2022-05-22 LAB — CULTURE, GROUP A STREP (THRC)

## 2022-05-24 ENCOUNTER — Other Ambulatory Visit: Payer: Self-pay | Admitting: Medical

## 2022-05-24 DIAGNOSIS — K219 Gastro-esophageal reflux disease without esophagitis: Secondary | ICD-10-CM

## 2022-05-29 ENCOUNTER — Other Ambulatory Visit: Payer: Self-pay | Admitting: Medical

## 2022-07-24 ENCOUNTER — Ambulatory Visit: Payer: 59 | Admitting: Medical

## 2022-07-24 ENCOUNTER — Encounter: Payer: Self-pay | Admitting: Medical

## 2022-07-24 VITALS — BP 130/82 | HR 81 | Wt 180.6 lb

## 2022-07-24 DIAGNOSIS — E1159 Type 2 diabetes mellitus with other circulatory complications: Secondary | ICD-10-CM

## 2022-07-24 DIAGNOSIS — E785 Hyperlipidemia, unspecified: Secondary | ICD-10-CM | POA: Diagnosis not present

## 2022-07-24 DIAGNOSIS — E113299 Type 2 diabetes mellitus with mild nonproliferative diabetic retinopathy without macular edema, unspecified eye: Secondary | ICD-10-CM

## 2022-07-24 DIAGNOSIS — F32A Depression, unspecified: Secondary | ICD-10-CM

## 2022-07-24 DIAGNOSIS — F419 Anxiety disorder, unspecified: Secondary | ICD-10-CM

## 2022-07-24 DIAGNOSIS — I152 Hypertension secondary to endocrine disorders: Secondary | ICD-10-CM

## 2022-07-24 DIAGNOSIS — G8929 Other chronic pain: Secondary | ICD-10-CM

## 2022-07-24 DIAGNOSIS — M542 Cervicalgia: Secondary | ICD-10-CM | POA: Diagnosis not present

## 2022-07-24 LAB — POCT GLYCOSYLATED HEMOGLOBIN (HGB A1C): Hemoglobin A1C: 6.6 % — AB (ref 4.0–5.6)

## 2022-07-24 MED ORDER — BUPROPION HCL ER (SR) 100 MG PO TB12: 100.0000 mg | ORAL_TABLET | Freq: Every day | ORAL | 0 refills | Status: DC

## 2022-07-24 MED ORDER — ESCITALOPRAM OXALATE 20 MG PO TABS: 20.0000 mg | ORAL_TABLET | Freq: Every day | ORAL | 1 refills | Status: DC

## 2022-07-24 NOTE — Progress Notes (Signed)
Subjective:  Jacob Hart is a 57 y.o. male who presents for Chief Complaint  Patient presents with   Medication Management    Pt. Would like to discuss monjoro injection. Pt want to discuss switching back to [place]. Pt is all out of Wellbutrin and would like a refill.      Here for med check  Diabetes-compliant with Jardiance 25 mg daily, metformin 1000 mg twice daily.  He was taking Mounjaro but he was losing too much weight.  He does not want to lose less than 180 pounds.  He has been on Bcise before and thought about going back on that.  He has not been checking his blood sugars of late.  No polyuria, no polydipsia, no other symptoms of concern  Hyperlipidemia-compliant with Lipitor 10 mg without complaint.  HTN on  Enalapril for renal protection and BP  He feels like he does fine on Lexapro and Wellbutrin however he is having issues with his pharmacy and getting the Wellbutrin.  No other c/o.  Past Medical History:  Diagnosis Date   Allergy    Anxiety and depression 12/23/2016   Controlled type 2 diabetes mellitus without complication, without long-term current use of insulin 12/23/2016   GERD (gastroesophageal reflux disease) 12/23/2016   History of esophageal stricture    dilation done in 2016 at [facility]   Hypertension associated with diabetes 12/23/2016   Substance abuse    Uncontrolled diabetes mellitus type 2 without complications 12/23/2016   Current Outpatient Medications on File Prior to Visit  Medication Sig Dispense Refill   atorvastatin (LIPITOR) 10 MG tablet Take 1 tablet (10 mg total) by mouth daily. 90 tablet 3   benzonatate (TESSALON) 100 MG capsule Take 1 capsule (100 mg total) by mouth 3 (three) times daily as needed for cough. 30 capsule 0   empagliflozin (JARDIANCE) 25 MG TABS tablet TAKE 1 TABLET (25 MG TOTAL) BY MOUTH DAILY BEFORE BREAKFAST. 90 tablet 3   enalapril (VASOTEC) 5 MG tablet Take 1 tablet (5 mg total) by mouth daily. 90 tablet 3    Ginkgo Biloba 40 MG TABS Take 1 tablet by mouth in the morning and at bedtime.     metFORMIN (GLUCOPHAGE) 1000 MG tablet Take 1 tablet (1,000 mg total) by mouth 2 (two) times daily with a meal. 180 tablet 1   omeprazole (PRILOSEC) 40 MG capsule Take 1 capsule by mouth once daily 90 capsule 1   amoxicillin-clavulanate (AUGMENTIN) 875-125 MG tablet Take 1 tablet by mouth every 12 (twelve) hours. (Patient not taking: Reported on 07/24/2022) 14 tablet 0   MOUNJARO 2.5 MG/0.5ML Pen INJECT 2.5 MG SUBCUTANEOUSLY ONCE A WEEK (Patient not taking: Reported on 07/24/2022) 4 mL 0   No current facility-administered medications on file prior to visit.     The following portions of the patient's history were reviewed and updated as appropriate: allergies, current medications, past family history, past medical history, past social history, past surgical history and problem list.  ROS Otherwise as in subjective above  Objective: BP 130/82   Pulse 81   Wt 180 lb 9.6 oz (81.9 kg)   SpO2 97%   BMI 25.91 kg/m   General appearance: alert, no distress, well developed, well nourished Neck: supple, no lymphadenopathy, no thyromegaly, no masses Heart: RRR, normal S1, S2, no murmurs Lungs: CTA bilaterally, no wheezes, rhonchi, or rales Pulses: 2+ radial pulses, 2+ pedal pulses, normal cap refill Ext: no edema   Assessment: Encounter Diagnoses  Name Primary?  Type 2 diabetes mellitus with mild nonproliferative retinopathy without macular edema, unspecified laterality, unspecified whether long term insulin use Yes   Hypertension associated with diabetes    Hyperlipidemia, unspecified hyperlipidemia type    Chronic neck pain    Anxiety and depression      Plan: Diabetes Hemoglobin A1c 6.6 % today Continue Jardiance 25 mg daily Continue metformin 1000 mg twice daily Discontinue Mounjaro due to excessive weight loss that he was not comfortable with I did encourage him to check his sugars more  regularly with goal of fasting less than 130 morning  Hypertension associate with diabetes Doing fine on enalapril 5 mg daily  Hyperlipidemia We discussed his Lipitor dose 10 mg.  He does not want to increase this for added protection.  He is comfortable with the dose as is I reviewed his CT coronary calcium test from last year that had very slight findings of plaque His last lipid panel in October 2023 was okay, at goal  Chronic neck pain I am happy to hear he had great improvement with physical therapy and January.  Doing okay currently    Gradie was seen today for medication management.  Diagnoses and all orders for this visit:  Type 2 diabetes mellitus with mild nonproliferative retinopathy without macular edema, unspecified laterality, unspecified whether long term insulin use  Hypertension associated with diabetes  Hyperlipidemia, unspecified hyperlipidemia type  Chronic neck pain  Anxiety and depression -     buPROPion ER (WELLBUTRIN SR) 100 MG 12 hr tablet; Take 1 tablet (100 mg total) by mouth daily.  Other orders -     escitalopram (LEXAPRO) 20 MG tablet; Take 1 tablet (20 mg total) by mouth daily.    Follow up: pending labs, yearly for well visit

## 2022-07-24 NOTE — Addendum Note (Signed)
Addended by: Herminio Commons A on: 07/24/2022 01:05 PM   Modules accepted: Orders

## 2022-07-26 DIAGNOSIS — K219 Gastro-esophageal reflux disease without esophagitis: Secondary | ICD-10-CM

## 2022-07-26 DIAGNOSIS — E1159 Type 2 diabetes mellitus with other circulatory complications: Secondary | ICD-10-CM

## 2022-07-26 DIAGNOSIS — F32A Depression, unspecified: Secondary | ICD-10-CM

## 2022-07-26 DIAGNOSIS — E1165 Type 2 diabetes mellitus with hyperglycemia: Secondary | ICD-10-CM

## 2022-07-26 DIAGNOSIS — I152 Hypertension secondary to endocrine disorders: Secondary | ICD-10-CM

## 2022-07-27 MED ORDER — OMEPRAZOLE 40 MG PO CPDR: 40.0000 mg | DELAYED_RELEASE_CAPSULE | Freq: Every day | ORAL | 2 refills | Status: DC

## 2022-07-27 MED ORDER — BUPROPION HCL ER (SR) 100 MG PO TB12: 100.0000 mg | ORAL_TABLET | Freq: Every day | ORAL | 2 refills | Status: DC

## 2022-07-27 MED ORDER — ESCITALOPRAM OXALATE 20 MG PO TABS: 20.0000 mg | ORAL_TABLET | Freq: Every day | ORAL | 2 refills | Status: DC

## 2022-07-27 MED ORDER — ENALAPRIL MALEATE 5 MG PO TABS: 5.0000 mg | ORAL_TABLET | Freq: Every day | ORAL | 2 refills | Status: DC

## 2022-07-27 MED ORDER — METFORMIN HCL 1000 MG PO TABS: 1000.0000 mg | ORAL_TABLET | Freq: Two times a day (BID) | ORAL | 2 refills | Status: DC

## 2022-07-27 MED ORDER — ATORVASTATIN CALCIUM 10 MG PO TABS: 10.0000 mg | ORAL_TABLET | Freq: Every day | ORAL | 2 refills | Status: DC

## 2022-07-27 MED ORDER — EMPAGLIFLOZIN 25 MG PO TABS: ORAL_TABLET | ORAL | 2 refills | Status: DC

## 2022-08-04 NOTE — Progress Notes (Signed)
[facility] - Clinic Note  08/18/2022    CHIEF COMPLAINT Patient presents for Retina Follow Up    HISTORY OF PRESENT ILLNESS: Jacob Hart is a 57 y.o. male who presents to the clinic today for:   HPI     Retina Follow Up   Patient presents with  Diabetic Retinopathy.  In both eyes.  This started 6 months ago.  Duration of 6 months.  Since onset it is stable.  I, the attending physician,  performed the HPI with the patient and updated documentation appropriately.        Comments   6 month retina follow up NPDR ou pt is reporting vision may not be as sharp at a distance his last A1C 6.6 1 month ago his last reading was 140 this morning        Last edited by Rennis Chris, MD on 08/19/2022  1:41 AM.       Referring physician: Jac Canavan, PA-C [id number] [place],  [place] [address]  HISTORICAL INFORMATION:   Selected notes from the MEDICAL RECORD NUMBER Referred by Hetty Blend, NP-C for DM exam LEE:  Ocular Hx- PMH-DM (last A1C: 7.0, taking jardiance, metformin) HTN, anxiety, depression    CURRENT MEDICATIONS: No current outpatient medications on file. (Ophthalmic Drugs)   No current facility-administered medications for this visit. (Ophthalmic Drugs)   Current Outpatient Medications (Other)  Medication Sig   amoxicillin-clavulanate (AUGMENTIN) 875-125 MG tablet Take 1 tablet by mouth every 12 (twelve) hours. (Patient not taking: Reported on 07/24/2022)   atorvastatin (LIPITOR) 10 MG tablet Take 1 tablet (10 mg total) by mouth daily.   benzonatate (TESSALON) 100 MG capsule Take 1 capsule (100 mg total) by mouth 3 (three) times daily as needed for cough.   buPROPion ER (WELLBUTRIN SR) 100 MG 12 hr tablet Take 1 tablet (100 mg total) by mouth daily.   empagliflozin (JARDIANCE) 25 MG TABS tablet TAKE 1 TABLET (25 MG TOTAL) BY MOUTH DAILY BEFORE BREAKFAST.   enalapril (VASOTEC) 5 MG tablet Take 1 tablet (5 mg total) by mouth  daily.   escitalopram (LEXAPRO) 20 MG tablet Take 1 tablet (20 mg total) by mouth daily.   Ginkgo Biloba 40 MG TABS Take 1 tablet by mouth in the morning and at bedtime.   metFORMIN (GLUCOPHAGE) 1000 MG tablet Take 1 tablet (1,000 mg total) by mouth 2 (two) times daily with a meal.   MOUNJARO 2.5 MG/0.5ML Pen INJECT 2.5 MG SUBCUTANEOUSLY ONCE A WEEK (Patient not taking: Reported on 07/24/2022)   omeprazole (PRILOSEC) 40 MG capsule Take 1 capsule (40 mg total) by mouth daily.   No current facility-administered medications for this visit. (Other)   REVIEW OF SYSTEMS: ROS   Positive for: Neurological, Endocrine, Eyes Negative for: Constitutional, Gastrointestinal, Skin, Genitourinary, Musculoskeletal, HENT, Cardiovascular, Respiratory, Psychiatric, Allergic/Imm, Heme/Lymph Last edited by Etheleen Mayhew, COT on 08/18/2022  9:37 AM.      ALLERGIES Allergies  Allergen Reactions   Sulfa Antibiotics Hives   PAST MEDICAL HISTORY Past Medical History:  Diagnosis Date   Allergy    Anxiety and depression 12/23/2016   Controlled type 2 diabetes mellitus without complication, without long-term current use of insulin (HCC) 12/23/2016   GERD (gastroesophageal reflux disease) 12/23/2016   History of esophageal stricture    dilation done in 2016 at [facility]   Hypertension associated with diabetes (HCC) 12/23/2016   Substance abuse (HCC)    Uncontrolled diabetes mellitus type 2  without complications 12/23/2016   Past Surgical History:  Procedure Laterality Date   ABDOMINAL SURGERY     as a child   ESOPHAGOGASTRODUODENOSCOPY  08/02/2014   mild chronic nonspecific gastritis, stomach and endoscopic biopises. reflux esophagitis   ESOPHAGOGASTRODUODENOSCOPY (EGD) WITH ESOPHAGEAL DILATION     mild chronic stomach gastritis per record in 2016   NASAL SEPTUM SURGERY     TONSILLECTOMY     VASECTOMY     FAMILY HISTORY Family History  Problem Relation Age of Onset   Lung cancer Mother 65    Prostate cancer Father 67   Bladder Cancer Father    Lung cancer Maternal Grandfather    Colon cancer Paternal Grandmother    CAD Brother    SOCIAL HISTORY Social History   Tobacco Use   Smoking status: Never   Smokeless tobacco: Current    Types: Snuff  Vaping Use   Vaping Use: Never used  Substance Use Topics   Alcohol use: No    Comment: recovering alcoholic, 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   Drug use: No       OPHTHALMIC EXAM: Base Eye Exam     Visual Acuity (Snellen - Linear)       Right Left   Dist [place] 20/25 20/25         Tonometry (Tonopen, 9:38 AM)       Right Left   Pressure 13 15         Pupils       Pupils Dark Light Shape React APD   Right PERRL 4 3 Round Brisk None   Left PERRL 4 3 Round Brisk None         Visual Fields       Left Right    Full          Extraocular Movement       Right Left    Full, Ortho Full, Ortho         Neuro/Psych     Oriented x3: Yes   Mood/Affect: Normal         Dilation     Both eyes: 2.5% Phenylephrine @ 9:40 AM           Slit Lamp and Fundus Exam     Slit Lamp Exam       Right Left   Lids/Lashes Dermatochalasis - upper lid Dermatochalasis - upper lid   Conjunctiva/Sclera White and quiet White and quiet   Cornea Tear film debris mild tear film debris   Anterior Chamber Deep and quiet Deep and quiet   Iris Round and dilated, No NVI Round and dilated, No NVI   Lens 2+ Nuclear sclerosis, 2+ Cortical cataract 2+ Nuclear sclerosis, 2+ Cortical cataract   Anterior Vitreous Mild vitreous syneresis Mild syneresis         Fundus Exam       Right Left   Disc Tilted disc, Temporal Peripapillary atrophy, Pink and Sharp Tilted disc, Temporal Peripapillary atrophy, Pink and Sharp   C/D Ratio 0.5 0.6   Macula Flat, blunted foveal reflex, focal cluster of DBH and thickening ST mac-improved, mild Retinal pigment epithelial mottling, scattered Microaneurysms blunted foveal reflex, scattered MA, focal punctate  exudates and cystic changes temporal macula   Vessels mild tortuosity, no NV attenuated, mild tortuosity   Periphery Attached, scattered MA's Attached, scattered MA            IMAGING AND PROCEDURES  Imaging and Procedures for @TODAY @  OCT, Retina - OU -  Both Eyes       Right Eye Quality was good. Central Foveal Thickness: 319. Progression has been stable. Findings include normal foveal contour, no IRF, no SRF, vitreomacular adhesion (No DME).   Left Eye Quality was good. Central Foveal Thickness: 321. Progression has worsened. Findings include normal foveal contour, no SRF, intraretinal hyper-reflective material, intraretinal fluid, vitreomacular adhesion (Blunted foveal contour, mild interval increase in IRF/IRHM temp mac.).   Notes *Images captured and stored on drive  Diagnosis / Impression:  OD: No DME OS: Blunted foveal contour, mild interval increase in IRF/IRHM temp mac.  Clinical management:  See below  Abbreviations: NFP - Normal foveal profile. CME - cystoid macular edema. PED - pigment epithelial detachment. IRF - intraretinal fluid. SRF - subretinal fluid. EZ - ellipsoid zone. ERM - epiretinal membrane. ORA - outer retinal atrophy. ORT - outer retinal tubulation. SRHM - subretinal hyper-reflective material             ASSESSMENT/PLAN:    ICD-10-CM   1. 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 Long term (current) use of oral hypoglycemic drugs  Z79.84     3. Essential hypertension  I10     4. Hypertensive retinopathy of both eyes  H35.033     5. Combined forms of age-related cataract of both eyes  H25.813      1,2. Mild Non-proliferative diabetic retinopathy OU  - OS w/ mild DME/cystic changes temporal fovea/macula  - A1c: 6.6 on 4.19.24, 8.6 on 09.23.23, 7.3 on 03.27.23; 6.6 on 09.22.22 - exam shows scattered MA OU; no  NV - FA (11.18.22) shows no NV OU, OS with prominent MA temporal to fovea -- potential focal laser target - BCVA decreased to 20/25 OU from 20/20 OU - OCT shows: OD: No DME OS: Blunted foveal contour, mild interval increase in IRF/IRHM temp mac. - no treatment recommended at this time - monitor - f/u in 6 months, DFE, OCT  3,4. Hypertensive retinopathy OU - discussed importance of tight BP control - monitor  5. Combined form age-related cataract OU - The symptoms of cataract, surgical options, and treatments and risks were discussed with patient. - discussed diagnosis and progression - not yet visually significant - monitor for now  Ophthalmic Meds Ordered this visit:  No orders of the defined types were placed in this encounter.    Return in about 6 months (around 02/18/2023) for NPDR OU, DFE, OCT .  There are no Patient Instructions on file for this visit.   Explained the diagnoses, plan, and follow up with the patient and they expressed understanding.  Patient expressed understanding of the importance of proper follow up care.   This document serves as a record of services personally performed by Karie Chimera, MD, PhD. It was created on their behalf by Gerilyn Nestle, COT an ophthalmic technician. The creation of this record is the provider's dictation and/or activities during the visit.    Electronically signed by:  Gerilyn Nestle, COT  04.30.24 1:42 AM   Karie Chimera, M.D., Ph.D. Diseases & Surgery of the Retina and Vitreous [facility]  I have reviewed the above documentation for accuracy and completeness, and I agree with the above. Karie Chimera, M.D., Ph.D. 08/19/22 1:46 AM   Abbreviations: M myopia (nearsighted); A astigmatism; H hyperopia (farsighted); P presbyopia; Mrx spectacle prescription;  CTL contact lenses; OD right eye; OS left eye; OU both eyes  XT exotropia; ET esotropia; PEK punctate epithelial keratitis; PEE  punctate epithelial erosions; DES dry eye syndrome; MGD meibomian gland dysfunction; ATs artificial tears; PFAT's preservative free artificial tears; NSC nuclear sclerotic cataract; PSC posterior subcapsular cataract; ERM epi-retinal membrane; PVD posterior vitreous detachment; RD retinal detachment; DM diabetes mellitus; DR diabetic retinopathy; NPDR non-proliferative diabetic retinopathy; PDR proliferative diabetic retinopathy; CSME clinically significant macular edema; DME diabetic macular edema; dbh dot blot hemorrhages; CWS cotton wool spot; POAG primary open angle glaucoma; C/D cup-to-disc ratio; HVF humphrey visual field; GVF goldmann visual field; OCT optical coherence tomography; IOP intraocular pressure; BRVO Branch retinal vein occlusion; CRVO central retinal vein occlusion; CRAO central retinal artery occlusion; BRAO branch retinal artery occlusion; RT retinal tear; SB scleral buckle; PPV pars plana vitrectomy; VH Vitreous hemorrhage; PRP panretinal laser photocoagulation; IVK intravitreal kenalog; VMT vitreomacular traction; MH Macular hole;  NVD neovascularization of the disc; NVE neovascularization elsewhere; AREDS age related eye disease study; ARMD age related macular degeneration; POAG primary open angle glaucoma; EBMD epithelial/anterior basement membrane dystrophy; ACIOL anterior chamber intraocular lens; IOL intraocular lens; PCIOL posterior chamber intraocular lens; Phaco/IOL phacoemulsification with intraocular lens placement; PRK photorefractive keratectomy; LASIK laser assisted in situ keratomileusis; HTN hypertension; DM diabetes mellitus; COPD chronic obstructive pulmonary disease

## 2022-08-18 ENCOUNTER — Encounter (INDEPENDENT_AMBULATORY_CARE_PROVIDER_SITE_OTHER): Payer: Self-pay | Admitting: Ophthalmology

## 2022-08-18 ENCOUNTER — Ambulatory Visit (INDEPENDENT_AMBULATORY_CARE_PROVIDER_SITE_OTHER): Payer: 59 | Admitting: Ophthalmology

## 2022-08-18 DIAGNOSIS — I10 Essential (primary) hypertension: Secondary | ICD-10-CM | POA: Diagnosis not present

## 2022-08-18 DIAGNOSIS — H35033 Hypertensive retinopathy, bilateral: Secondary | ICD-10-CM | POA: Diagnosis not present

## 2022-08-18 DIAGNOSIS — Z7984 Long term (current) use of oral hypoglycemic drugs: Secondary | ICD-10-CM | POA: Diagnosis not present

## 2022-08-18 DIAGNOSIS — E113293 Type 2 diabetes mellitus with mild nonproliferative diabetic retinopathy without macular edema, bilateral: Secondary | ICD-10-CM

## 2022-08-18 DIAGNOSIS — H25813 Combined forms of age-related cataract, bilateral: Secondary | ICD-10-CM

## 2022-08-19 ENCOUNTER — Encounter (INDEPENDENT_AMBULATORY_CARE_PROVIDER_SITE_OTHER): Payer: Self-pay | Admitting: Ophthalmology

## 2022-09-28 ENCOUNTER — Encounter: Payer: Self-pay | Admitting: Medical

## 2022-09-28 ENCOUNTER — Ambulatory Visit: Payer: 59 | Admitting: Medical

## 2022-09-28 VITALS — BP 114/64 | HR 78 | Temp 98.4°F | Resp 18 | Wt 186.2 lb

## 2022-09-28 DIAGNOSIS — R059 Cough, unspecified: Secondary | ICD-10-CM | POA: Diagnosis not present

## 2022-09-28 DIAGNOSIS — J3089 Other allergic rhinitis: Secondary | ICD-10-CM | POA: Diagnosis not present

## 2022-09-28 DIAGNOSIS — H1013 Acute atopic conjunctivitis, bilateral: Secondary | ICD-10-CM

## 2022-09-28 DIAGNOSIS — R0989 Other specified symptoms and signs involving the circulatory and respiratory systems: Secondary | ICD-10-CM

## 2022-09-28 MED ORDER — MONTELUKAST SODIUM 10 MG PO TABS: 10.0000 mg | ORAL_TABLET | Freq: Every day | ORAL | 1 refills | Status: DC

## 2022-09-28 NOTE — Progress Notes (Unsigned)
Subjective:  Jacob Hart is a 57 y.o. male who presents for Chief Complaint  Patient presents with   Allergies    Patient complains of PND, cough, burning in eyes and itchy eyes x 2 weeks. Symptoms started after cutting grass. Has tried taking Zyrtec. Patient believes that he has environmental allergies.     Here for allergy problems.  This has been a year round issue for years.  Already taking xyzal daily, flonase daily, and recently added pataday drops for eyes.   Recently when mowing, despite wearing long shirt and pants, got flare up with worse cough, runny nose and skin itching.    Nonsmoker  All winter has congestion and allergy symtpoms, had improvement for about a month, then ever since aggravated with allergy symptoms.  No other aggravating or relieving factors.    No other c/o.   Past Medical History:  Diagnosis Date   Allergy    Anxiety and depression 12/23/2016   Controlled type 2 diabetes mellitus without complication, without long-term current use of insulin (HCC) 12/23/2016   GERD (gastroesophageal reflux disease) 12/23/2016   History of esophageal stricture    dilation done in 2016 at [facility]   Hypertension associated with diabetes (HCC) 12/23/2016   Substance abuse (HCC)    Uncontrolled diabetes mellitus type 2 without complications 12/23/2016   Current Outpatient Medications on File Prior to Visit  Medication Sig Dispense Refill   atorvastatin (LIPITOR) 10 MG tablet Take 1 tablet (10 mg total) by mouth daily. 90 tablet 2   empagliflozin (JARDIANCE) 25 MG TABS tablet TAKE 1 TABLET (25 MG TOTAL) BY MOUTH DAILY BEFORE BREAKFAST. 90 tablet 2   enalapril (VASOTEC) 5 MG tablet Take 1 tablet (5 mg total) by mouth daily. 90 tablet 2   escitalopram (LEXAPRO) 20 MG tablet Take 1 tablet (20 mg total) by mouth daily. 90 tablet 2   Ginkgo Biloba 40 MG TABS Take 1 tablet by mouth in the morning and at bedtime.     metFORMIN (GLUCOPHAGE) 1000 MG tablet Take 1 tablet  (1,000 mg total) by mouth 2 (two) times daily with a meal. 180 tablet 2   MOUNJARO 2.5 MG/0.5ML Pen INJECT 2.5 MG SUBCUTANEOUSLY ONCE A WEEK 4 mL 0   omeprazole (PRILOSEC) 40 MG capsule Take 1 capsule (40 mg total) by mouth daily. 90 capsule 2   buPROPion ER (WELLBUTRIN SR) 100 MG 12 hr tablet Take 1 tablet (100 mg total) by mouth daily. (Patient not taking: Reported on 09/28/2022) 90 tablet 2   No current facility-administered medications on file prior to visit.    The following portions of the patient's history were reviewed and updated as appropriate: allergies, current medications, past family history, past medical history, past social history, past surgical history and problem list.  ROS Otherwise as in subjective above    Objective: BP 114/64   Pulse 78   Temp 98.4 F (36.9 C) (Oral)   Resp 18   Wt 186 lb 3.2 oz (84.5 kg)   SpO2 96% Comment: room air  BMI 26.72 kg/m   General appearance: alert, no distress, well developed, well nourished HEENT: normocephalic, sclerae anicteric, conjunctiva pink and moist, TMs pearly, nares patent, no discharge or erythema, pharynx normal Oral cavity: MMM, no lesions Neck: supple, no lymphadenopathy, no thyromegaly, no masses Heart: RRR, normal S1, S2, no murmurs Lungs: CTA bilaterally, no wheezes, rhonchi, or rales    Assessment: Encounter Diagnoses  Name Primary?   Non-seasonal allergic rhinitis, unspecified  trigger Yes   Allergic conjunctivitis of both eyes    Runny nose    Cough, unspecified type      Plan: Discussed symptoms, prior treatment, possible triggers.  Discussed continuing current regimen of pataday eye drops, flonase nasal, xyzall tablet, but adding singulari below.  Avoid triggers when possible  After 3-4 weeks when doing better hopefully, plan a day to come in for allergy lab testing by serum blood tests when he has been off allergy medication for 3 days.  Jacob Hart was seen today for allergies.  Diagnoses and  all orders for this visit:  Non-seasonal allergic rhinitis, unspecified trigger  Allergic conjunctivitis of both eyes  Runny nose  Cough, unspecified type  Other orders -     montelukast (SINGULAIR) 10 MG tablet; Take 1 tablet (10 mg total) by mouth at bedtime.    Follow up: soon for allergy profile labs

## 2022-10-13 ENCOUNTER — Other Ambulatory Visit: Payer: Self-pay | Admitting: Medical

## 2022-10-14 ENCOUNTER — Ambulatory Visit: Payer: 59 | Admitting: Medical

## 2022-10-14 VITALS — BP 120/70 | HR 82 | Wt 188.0 lb

## 2022-10-14 DIAGNOSIS — H1013 Acute atopic conjunctivitis, bilateral: Secondary | ICD-10-CM

## 2022-10-14 DIAGNOSIS — B351 Tinea unguium: Secondary | ICD-10-CM | POA: Insufficient documentation

## 2022-10-14 DIAGNOSIS — E1159 Type 2 diabetes mellitus with other circulatory complications: Secondary | ICD-10-CM

## 2022-10-14 DIAGNOSIS — E113299 Type 2 diabetes mellitus with mild nonproliferative diabetic retinopathy without macular edema, unspecified eye: Secondary | ICD-10-CM

## 2022-10-14 DIAGNOSIS — I152 Hypertension secondary to endocrine disorders: Secondary | ICD-10-CM

## 2022-10-14 DIAGNOSIS — E785 Hyperlipidemia, unspecified: Secondary | ICD-10-CM

## 2022-10-14 DIAGNOSIS — R0989 Other specified symptoms and signs involving the circulatory and respiratory systems: Secondary | ICD-10-CM

## 2022-10-14 DIAGNOSIS — J3089 Other allergic rhinitis: Secondary | ICD-10-CM | POA: Diagnosis not present

## 2022-10-14 DIAGNOSIS — R059 Cough, unspecified: Secondary | ICD-10-CM

## 2022-10-14 DIAGNOSIS — E559 Vitamin D deficiency, unspecified: Secondary | ICD-10-CM | POA: Diagnosis not present

## 2022-10-14 MED ORDER — TERBINAFINE HCL 250 MG PO TABS: 250.0000 mg | ORAL_TABLET | Freq: Every day | ORAL | 0 refills | Status: DC

## 2022-10-14 MED ORDER — VITAMIN D (CHOLECALCIFEROL) 25 MCG (1000 UT) PO TABS: 1.0000 | ORAL_TABLET | Freq: Every day | ORAL | 1 refills | Status: DC

## 2022-10-14 NOTE — Progress Notes (Signed)
Subjective:  Jacob Hart is a 57 y.o. male who presents for Chief Complaint  Patient presents with   Medical Management of Chronic Issues    Diabetes- would like allergy testing as well.     Here for med check and allergy testing.  Jacob Hart has a history of diabetes type 2, hypertension, hyperlipidemia, mild sleep apnea, history of anxiety and depression, retinopathy due to diabetes, vitamin D deficiency, GERD  Last visit we discussed his desire for allergy testing given symptoms he gets particularly outside mowing the yard or doing activity outside.  He has not taken any allergy medications and more than 3 days in preparation for this test.  Diabetes-blood sugars lately have been running okay.  Compliant with Metformin 1000 mg twice daily, Jardiance 25 mg daily.  Dyslipidemia-compliant with atorvastatin Lipitor 10 mg daily  Hypertension-taking enalapril 5 mg daily  History of vitamin D deficiency, not currently on vitamin D supplement  No other aggravating or relieving factors.    No other c/o.   Past Medical History:  Diagnosis Date   Allergy    Anxiety and depression 12/23/2016   Controlled type 2 diabetes mellitus without complication, without long-term current use of insulin (HCC) 12/23/2016   GERD (gastroesophageal reflux disease) 12/23/2016   History of esophageal stricture    dilation done in 2016 at [facility]   Hypertension associated with diabetes (HCC) 12/23/2016   Substance abuse (HCC)    Uncontrolled diabetes mellitus type 2 without complications 12/23/2016   Current Outpatient Medications on File Prior to Visit  Medication Sig Dispense Refill   atorvastatin (LIPITOR) 10 MG tablet Take 1 tablet (10 mg total) by mouth daily. 90 tablet 2   buPROPion ER (WELLBUTRIN SR) 100 MG 12 hr tablet Take 1 tablet (100 mg total) by mouth daily. 90 tablet 2   empagliflozin (JARDIANCE) 25 MG TABS tablet TAKE 1 TABLET (25 MG TOTAL) BY MOUTH DAILY BEFORE BREAKFAST. 90  tablet 2   enalapril (VASOTEC) 5 MG tablet Take 1 tablet (5 mg total) by mouth daily. 90 tablet 2   escitalopram (LEXAPRO) 20 MG tablet Take 1 tablet (20 mg total) by mouth daily. 90 tablet 2   Ginkgo Biloba 40 MG TABS Take 1 tablet by mouth in the morning and at bedtime.     metFORMIN (GLUCOPHAGE) 1000 MG tablet Take 1 tablet (1,000 mg total) by mouth 2 (two) times daily with a meal. 180 tablet 2   montelukast (SINGULAIR) 10 MG tablet Take 1 tablet (10 mg total) by mouth at bedtime. 30 tablet 1   omeprazole (PRILOSEC) 40 MG capsule Take 1 capsule (40 mg total) by mouth daily. 90 capsule 2   No current facility-administered medications on file prior to visit.    The following portions of the patient's history were reviewed and updated as appropriate: allergies, current medications, past family history, past medical history, past social history, past surgical history and problem list.  ROS Otherwise as in subjective above    Objective: BP 120/70   Pulse 82   Wt 188 lb (85.3 kg)   BMI 26.98 kg/m   General appearance: alert, no distress, well developed, well nourished Neck: supple, no lymphadenopathy, no thyromegaly, no masses, no bruits Heart: RRR, normal S1, S2, no murmurs Lungs: CTA bilaterally, no wheezes, rhonchi, or rales Pulses: 2+ radial pulses, 2+ pedal pulses, normal cap refill Ext: no edema  Diabetic Foot Exam - Simple   Simple Foot Form Visual Inspection See comments: Yes Sensation  Testing Intact to touch and monofilament testing bilaterally: Yes Pulse Check Posterior Tibialis and Dorsalis pulse intact bilaterally: Yes Comments Mildly discolored toenails throughout, some thickening of great toenails       Assessment: Encounter Diagnoses  Name Primary?   Non-seasonal allergic rhinitis, unspecified trigger Yes   Vitamin D deficiency    Allergic conjunctivitis of both eyes    Runny nose    Cough, unspecified type    Type 2 diabetes mellitus with mild  nonproliferative retinopathy without macular edema, unspecified laterality, unspecified whether long term insulin use (HCC)    Hypertension associated with diabetes (HCC)    Hyperlipidemia, unspecified hyperlipidemia type    Onychomycosis      Plan: Vitamin D - I recommend you start vitamin D supplement 1000 units daily over-the-counter  History of allergies-allergy testing baseline today as below  Diabetes type 2-continue current therapies metformin 1000 mg twice daily and Jardiance 25 mg daily.  Continue glucose monitoring.  Hypertension associate with diabetes-continue current therapy enalapril 5 mg daily  Hyperlipidemia-continue atorvastatin 10 mg daily.  Onychomycosis-discussed findings, discussed treatment options.  He wants to begin Lamisil oral medication.  We discussed risk and benefits and lab monitoring.   Jacob Hart was seen today for medical management of chronic issues.  Diagnoses and all orders for this visit:  Non-seasonal allergic rhinitis, unspecified trigger -     Allergen food profile specific IgE -     Allergen Profile, Mold -     REGIONAL PANEL 2  Vitamin D deficiency  Allergic conjunctivitis of both eyes -     Allergen food profile specific IgE -     Allergen Profile, Mold -     REGIONAL PANEL 2  Runny nose -     Allergen food profile specific IgE -     Allergen Profile, Mold -     REGIONAL PANEL 2  Cough, unspecified type -     Allergen food profile specific IgE -     Allergen Profile, Mold -     REGIONAL PANEL 2  Type 2 diabetes mellitus with mild nonproliferative retinopathy without macular edema, unspecified laterality, unspecified whether long term insulin use (HCC)  Hypertension associated with diabetes (HCC)  Hyperlipidemia, unspecified hyperlipidemia type  Onychomycosis  Other orders -     terbinafine (LAMISIL) 250 MG tablet; Take 1 tablet (250 mg total) by mouth daily. -     Vitamin D, Cholecalciferol, 25 MCG (1000 UT) TABS; Take 1  tablet by mouth daily.    Follow up: pending labs

## 2022-10-16 LAB — REGIONAL PANEL 2

## 2022-10-16 LAB — ALLERGEN FOOD PROFILE SPECIFIC IGE
Allergen Apple, IgE: <0.1 kU/L
Allergen Tomato, IgE: <0.1 kU/L
IgE (Immunoglobulin E), Serum: 20 IU/mL (ref 6–495)
Milk IgE: <0.1 kU/L
Orange: <0.1 kU/L
Soybean IgE: <0.1 kU/L
Tuna: <0.1 kU/L

## 2022-10-16 LAB — ALLERGEN PROFILE, MOLD
Aureobasidi Pullulans IgE: <0.1 kU/L
Cladosporium Herbarum IgE: <0.1 kU/L
M009-IgE Fusarium proliferatum: <0.1 kU/L
Phoma Betae IgE: <0.1 kU/L
Setomelanomma Rostrat: <0.1 kU/L

## 2022-10-18 LAB — REGIONAL PANEL 2
012-IgE Goldenrod: 0.18 kU/L — AB
Amer Sycamore IgE Qn: <0.1 kU/L
Cottonwood IgE: <0.1 kU/L
Johnson Grass IgE: 0.55 kU/L — AB
Kentucky Bluegrass IgE: 1.15 kU/L — AB
Lamb's Quarters IgE: <0.1 kU/L
Maple/Box Elder IgE: <0.1 kU/L
Plantain, English IgE: <0.1 kU/L
T005-IgE Beech (American): <0.1 kU/L
W004-IgE Ragweed, False: 0.27 kU/L — AB

## 2022-10-18 LAB — ALLERGEN PROFILE, MOLD
Alternaria Alternata IgE: <0.1 kU/L
Aspergillus Fumigatus IgE: <0.1 kU/L
Candida Albicans IgE: <0.1 kU/L
M014-IgE Epicoccum purpur: <0.1 kU/L
Mucor Racemosus IgE: <0.1 kU/L
Penicillium Chrysogen IgE: <0.1 kU/L
Stemphylium Herbarum IgE: <0.1 kU/L

## 2022-10-18 LAB — ALLERGEN FOOD PROFILE SPECIFIC IGE
Allergen Corn, IgE: <0.1 kU/L
Chicken IgE: <0.1 kU/L
Codfish IgE: <0.1 kU/L
Egg White IgE: <0.1 kU/L
Peanut IgE: <0.1 kU/L
Shrimp IgE: <0.1 kU/L
Wheat IgE: <0.1 kU/L

## 2022-10-19 DIAGNOSIS — T7840XA Allergy, unspecified, initial encounter: Secondary | ICD-10-CM

## 2022-10-19 NOTE — Progress Notes (Signed)
 Results sent through MyChart

## 2022-11-23 ENCOUNTER — Encounter: Payer: Self-pay | Admitting: Medical

## 2022-11-23 ENCOUNTER — Ambulatory Visit: Payer: 59 | Admitting: Medical

## 2022-11-23 VITALS — BP 130/80 | HR 94 | Ht 69.0 in | Wt 183.0 lb

## 2022-11-23 DIAGNOSIS — N529 Male erectile dysfunction, unspecified: Secondary | ICD-10-CM | POA: Diagnosis not present

## 2022-11-23 DIAGNOSIS — E113299 Type 2 diabetes mellitus with mild nonproliferative diabetic retinopathy without macular edema, unspecified eye: Secondary | ICD-10-CM | POA: Diagnosis not present

## 2022-11-23 DIAGNOSIS — I152 Hypertension secondary to endocrine disorders: Secondary | ICD-10-CM

## 2022-11-23 DIAGNOSIS — E1159 Type 2 diabetes mellitus with other circulatory complications: Secondary | ICD-10-CM | POA: Diagnosis not present

## 2022-11-23 DIAGNOSIS — E785 Hyperlipidemia, unspecified: Secondary | ICD-10-CM

## 2022-11-23 MED ORDER — SILDENAFIL CITRATE 100 MG PO TABS: 50.0000 mg | ORAL_TABLET | Freq: Every day | ORAL | 0 refills | Status: DC | PRN

## 2022-11-23 NOTE — Progress Notes (Signed)
Subjective:  Jacob Hart is a 57 y.o. male who presents for Chief Complaint  Patient presents with   Consult    Would like rx for ED. Has used sildenafil 20 in the past (rx'd by [facility]) and worked well for him.      Here for concerns about erectile dysfunction.  He notes that he hasn't been intimate with his wife in a few years.  She has been battling depression.  He has used masturbation but in recent months not keeping erections. Still gets some erections but not as full as in the past.  Has used Viagra in the past.  He would like to use this again.  He is hopeful that he and his wife can be intimate again.  They  start marriage counseling this week  No chest pain, no dyspnea, no edema.  No other aggravating or relieving factors.    No other c/o.  Past Medical History:  Diagnosis Date   Allergy    Anxiety and depression 12/23/2016   Controlled type 2 diabetes mellitus without complication, without long-term current use of insulin (HCC) 12/23/2016   GERD (gastroesophageal reflux disease) 12/23/2016   History of esophageal stricture    dilation done in 2016 at [facility]   Hypertension associated with diabetes (HCC) 12/23/2016   Substance abuse (HCC)    Uncontrolled diabetes mellitus type 2 without complications 12/23/2016   Current Outpatient Medications on File Prior to Visit  Medication Sig Dispense Refill   atorvastatin (LIPITOR) 10 MG tablet Take 1 tablet (10 mg total) by mouth daily. 90 tablet 2   buPROPion ER (WELLBUTRIN SR) 100 MG 12 hr tablet Take 1 tablet (100 mg total) by mouth daily. 90 tablet 2   empagliflozin (JARDIANCE) 25 MG TABS tablet TAKE 1 TABLET (25 MG TOTAL) BY MOUTH DAILY BEFORE BREAKFAST. 90 tablet 2   enalapril (VASOTEC) 5 MG tablet Take 1 tablet (5 mg total) by mouth daily. 90 tablet 2   escitalopram (LEXAPRO) 20 MG tablet Take 1 tablet (20 mg total) by mouth daily. 90 tablet 2   Ginkgo Biloba 40 MG TABS Take 1 tablet by mouth in the morning and at  bedtime.     metFORMIN (GLUCOPHAGE) 1000 MG tablet Take 1 tablet (1,000 mg total) by mouth 2 (two) times daily with a meal. 180 tablet 2   omeprazole (PRILOSEC) 40 MG capsule Take 1 capsule (40 mg total) by mouth daily. 90 capsule 2   Vitamin D, Cholecalciferol, 25 MCG (1000 UT) TABS Take 1 tablet by mouth daily. 90 tablet 1   montelukast (SINGULAIR) 10 MG tablet Take 1 tablet (10 mg total) by mouth at bedtime. (Patient not taking: Reported on 11/23/2022) 30 tablet 1   No current facility-administered medications on file prior to visit.     The following portions of the patient's history were reviewed and updated as appropriate: allergies, current medications, past family history, past medical history, past social history, past surgical history and problem list.  ROS Otherwise as in subjective above  Objective: BP 130/80   Pulse 94   Ht 5\' 9"  (1.753 m)   Wt 183 lb (83 kg)   SpO2 95%   BMI 27.02 kg/m   General appearance: alert, no distress, well developed, well nourished     Assessment: Encounter Diagnoses  Name Primary?   Erectile dysfunction, unspecified erectile dysfunction type Yes   Type 2 diabetes mellitus with mild nonproliferative retinopathy without macular edema, unspecified laterality, unspecified whether long term  insulin use (HCC)    Hypertension associated with diabetes (HCC)    Hyperlipidemia, unspecified hyperlipidemia type      Plan: Erectile Dysfunction - Reviewed pathophysiology and differential diagnosis of erectile dysfunction with the patient.  Discussed treatment options.  Begin trial of Viagra.  Discussed potential risks of medications including hypotension and priapism.  Discussed proper use of medication.  Questions were answered.  Recheck 1-2 weeks  Continue other medicaiton for his chronic issues including diabetes, hypertension, hyperlipidemia.  Kamare was seen today for consult.  Diagnoses and all orders for this visit:  Erectile  dysfunction, unspecified erectile dysfunction type  Type 2 diabetes mellitus with mild nonproliferative retinopathy without macular edema, unspecified laterality, unspecified whether long term insulin use (HCC)  Hypertension associated with diabetes (HCC)  Hyperlipidemia, unspecified hyperlipidemia type  Other orders -     sildenafil (VIAGRA) 100 MG tablet; Take 0.5-1 tablets (50-100 mg total) by mouth daily as needed for erectile dysfunction.   Follow up: call report 2 weeks

## 2022-12-02 ENCOUNTER — Ambulatory Visit: Payer: 59 | Admitting: Allergy & Immunology

## 2023-01-05 ENCOUNTER — Encounter: Payer: No Typology Code available for payment source | Admitting: Medical

## 2023-01-06 ENCOUNTER — Ambulatory Visit: Payer: 59 | Admitting: Allergy & Immunology

## 2023-01-28 ENCOUNTER — Ambulatory Visit (INDEPENDENT_AMBULATORY_CARE_PROVIDER_SITE_OTHER): Payer: 59 | Admitting: Medical

## 2023-01-28 ENCOUNTER — Encounter: Payer: Self-pay | Admitting: Medical

## 2023-01-28 VITALS — BP 110/68 | HR 73 | Ht 70.0 in | Wt 185.8 lb

## 2023-01-28 DIAGNOSIS — Z2821 Immunization not carried out because of patient refusal: Secondary | ICD-10-CM

## 2023-01-28 DIAGNOSIS — E1159 Type 2 diabetes mellitus with other circulatory complications: Secondary | ICD-10-CM

## 2023-01-28 DIAGNOSIS — E785 Hyperlipidemia, unspecified: Secondary | ICD-10-CM

## 2023-01-28 DIAGNOSIS — E113299 Type 2 diabetes mellitus with mild nonproliferative diabetic retinopathy without macular edema, unspecified eye: Secondary | ICD-10-CM

## 2023-01-28 DIAGNOSIS — Z0000 Encounter for general adult medical examination without abnormal findings: Secondary | ICD-10-CM

## 2023-01-28 DIAGNOSIS — E559 Vitamin D deficiency, unspecified: Secondary | ICD-10-CM | POA: Diagnosis not present

## 2023-01-28 DIAGNOSIS — F419 Anxiety disorder, unspecified: Secondary | ICD-10-CM

## 2023-01-28 DIAGNOSIS — I152 Hypertension secondary to endocrine disorders: Secondary | ICD-10-CM

## 2023-01-28 DIAGNOSIS — Z1211 Encounter for screening for malignant neoplasm of colon: Secondary | ICD-10-CM

## 2023-01-28 DIAGNOSIS — Z125 Encounter for screening for malignant neoplasm of prostate: Secondary | ICD-10-CM

## 2023-01-28 DIAGNOSIS — N529 Male erectile dysfunction, unspecified: Secondary | ICD-10-CM

## 2023-01-28 DIAGNOSIS — F32A Depression, unspecified: Secondary | ICD-10-CM

## 2023-01-28 DIAGNOSIS — Z974 Presence of external hearing-aid: Secondary | ICD-10-CM | POA: Diagnosis not present

## 2023-01-28 DIAGNOSIS — K219 Gastro-esophageal reflux disease without esophagitis: Secondary | ICD-10-CM

## 2023-01-28 LAB — POCT URINALYSIS DIP (PROADVANTAGE DEVICE)
Bilirubin, UA: NEGATIVE
Blood, UA: NEGATIVE
Glucose, UA: >=1000 mg/dL — AB
Ketones, POC UA: NEGATIVE mg/dL
Leukocytes, UA: NEGATIVE
Nitrite, UA: NEGATIVE
Protein Ur, POC: NEGATIVE mg/dL
Specific Gravity, Urine: 1.01
Urobilinogen, Ur: NEGATIVE
pH, UA: 7 (ref 5.0–8.0)

## 2023-01-28 NOTE — Progress Notes (Signed)
Subjective:   HPI  Jacob Hart is a 57 y.o. male who presents for Chief Complaint  Patient presents with   Annual Exam    Needs form completed. Declines flu and covid. [facility] [place]    Patient Care Team: Aleen Campi, Cleda Mccreedy as PCP - General (Family Medicine) Sees dentist Dr. Rennis Chris, retina specialist Dr. Deneise Lever, chiropractor Dr. Tressia Danas, GI  Concerns: Hypertension - Compliant with medicaiton  Hyperlipidemia - compliant with statin  Diabetes - compliant with metformin and Jardiance.  Checks glucose every few days, good numbers under 120  Recently got hearing aids.    Anxiety and depression - taking Wellubtrin and Lexapro    Reviewed their medical, surgical, family, social, medication, and allergy history and updated chart as appropriate.  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 Tressia Danas   ESOPHAGOGASTRODUODENOSCOPY  08/02/2014   mild chronic nonspecific gastritis, stomach and endoscopic biopises. reflux esophagitis   ESOPHAGOGASTRODUODENOSCOPY (EGD) WITH ESOPHAGEAL DILATION     mild chronic stomach gastritis per record in 2016   NASAL SEPTUM SURGERY     TONSILLECTOMY     VASECTOMY       Family History  Problem Relation Age of Onset   Lung cancer Mother 19   Prostate cancer Father 31   Bladder Cancer Father    Lung cancer Maternal Grandfather    Colon cancer Paternal Grandmother    CAD Brother      Current Outpatient Medications:    atorvastatin (LIPITOR) 10 MG tablet, Take 1 tablet (10 mg total) by mouth daily., Disp: 90 tablet, Rfl: 2   buPROPion ER (WELLBUTRIN SR) 100 MG 12 hr tablet, Take 1 tablet (100 mg total) by mouth daily., Disp: 90 tablet, Rfl: 2   empagliflozin (JARDIANCE) 25 MG TABS tablet, TAKE 1 TABLET (25 MG TOTAL) BY MOUTH DAILY BEFORE BREAKFAST., Disp: 90 tablet, Rfl: 2   enalapril (VASOTEC) 5 MG tablet, Take 1 tablet (5 mg total) by mouth daily., Disp: 90 tablet, Rfl: 2   escitalopram (LEXAPRO) 20 MG tablet, Take 1 tablet (20 mg total) by mouth daily., Disp: 90 tablet, Rfl: 2   Ginkgo Biloba 40 MG TABS, Take 1 tablet by mouth in the morning and at bedtime., Disp: , Rfl:    metFORMIN (GLUCOPHAGE) 1000 MG tablet, Take 1 tablet (1,000 mg total) by mouth 2 (two) times daily with a meal., Disp: 180 tablet, Rfl: 2   omeprazole (PRILOSEC) 40 MG capsule, Take 1 capsule (40 mg total) by mouth daily., Disp: 90 capsule, Rfl: 2   sildenafil (VIAGRA) 100 MG tablet, Take 0.5-1 tablets (50-100 mg total) by mouth daily as needed for erectile dysfunction., Disp: 30 tablet, Rfl: 0   Vitamin D, Cholecalciferol,  25 MCG (1000 UT) TABS, Take 1 tablet by mouth daily., Disp: 90 tablet, Rfl: 1  Allergies  Allergen Reactions   Sulfa Antibiotics Hives    Review of Systems  Constitutional:  Negative for chills, fever, malaise/fatigue and weight loss.  HENT:  Negative for congestion, ear pain, hearing loss, sore throat and tinnitus.   Eyes:  Negative for blurred vision, pain and redness.  Respiratory:  Negative for cough, hemoptysis and shortness of breath.   Cardiovascular:  Negative for chest pain, palpitations, orthopnea, claudication and leg swelling.   Gastrointestinal:  Negative for abdominal pain, blood in stool, constipation, diarrhea, nausea and vomiting.  Genitourinary:  Negative for dysuria, flank pain, frequency, hematuria and urgency.  Musculoskeletal:  Negative for falls, joint pain and myalgias.  Skin:  Negative for itching and rash.  Neurological:  Negative for dizziness, tingling, speech change, weakness and headaches.  Endo/Heme/Allergies:  Negative for polydipsia. Does not bruise/bleed easily.  Psychiatric/Behavioral:  Negative for depression and memory loss. The patient is not nervous/anxious and does not have insomnia.         01/28/2023   11:27 AM 10/14/2022    8:25 AM 01/01/2022    9:23 AM 06/30/2021    8:34 AM 12/30/2020    8:17 AM  Depression screen PHQ 2/9  Decreased Interest 0 0 0 0 0  Down, Depressed, Hopeless 0 0 0 0 0  PHQ - 2 Score 0 0 0 0 0        Objective:  BP 110/68   Pulse 73   Ht 5\' 10"  (1.778 m)   Wt 185 lb 12.8 oz (84.3 kg)   BMI 26.66 kg/m   Wt Readings from Last 3 Encounters:  01/28/23 185 lb 12.8 oz (84.3 kg)  11/23/22 183 lb (83 kg)  10/14/22 188 lb (85.3 kg)   BP Readings from Last 3 Encounters:  01/28/23 110/68  11/23/22 130/80  10/14/22 120/70    General appearance: alert, no distress, WD/WN, Caucasian male Skin:  3mm diameter raised somewhat pearly skin lesion between eyes of forehead, otherwise unremarkable otherwise HEENT: normocephalic, conjunctiva/corneas normal, sclerae anicteric, PERRLA, EOMi, nares patent, no discharge or erythema, pharynx normal Oral cavity: MMM, tongue normal, teeth normal Neck: supple, no lymphadenopathy, no thyromegaly, no masses, normal ROM, no bruits Chest: non tender, normal shape and expansion Heart: RRR, normal S1, S2, no murmurs Lungs: CTA bilaterally, no wheezes, rhonchi, or rales Abdomen: +bs, soft, non tender, non distended, no masses, no hepatomegaly, no splenomegaly, no bruits Back: non tender, normal ROM, no  scoliosis Musculoskeletal: upper extremities non tender, no obvious deformity, normal ROM throughout, lower extremities non tender, no obvious deformity, normal ROM throughout Extremities: no edema, no cyanosis, no clubbing Pulses: 2+ symmetric, upper and lower extremities, normal cap refill Neurological: alert, oriented x 3, CN2-12 intact, strength normal upper extremities and lower extremities, sensation normal throughout, DTRs 2+ throughout, no cerebellar signs, gait normal Psychiatric: normal affect, behavior normal, pleasant  GU: declined/deferred Rectal: declined/deferred  Diabetic Foot Exam - Simple   Simple Foot Form Visual Inspection See comments: Yes Sensation Testing Intact to touch and monofilament testing bilaterally: Yes Pulse Check Posterior Tibialis and Dorsalis pulse intact bilaterally: Yes Comments Great toenails with some deformed thickened nails      Assessment and Plan :   Encounter Diagnoses  Name Primary?   Encounter for health maintenance examination in adult Yes   Influenza vaccination declined    Wears hearing aid in both ears    Vitamin D  deficiency    Type 2 diabetes mellitus with mild nonproliferative retinopathy without macular edema, unspecified laterality, unspecified whether long term insulin use (HCC)    Screening for prostate cancer    GERD without esophagitis    Hyperlipidemia, unspecified hyperlipidemia type    Hypertension associated with diabetes (HCC)    Anxiety and depression    Erectile dysfunction, unspecified erectile dysfunction type    Colon cancer screening     This visit was a preventative care visit, also known as wellness visit or routine physical.   Topics typically include healthy lifestyle, diet, exercise, preventative care, vaccinations, sick and well care, proper use of emergency dept and after hours care, as well as other concerns.     Recommendations: Continue to return yearly for your annual wellness and  preventative care visits.  This gives [place] a chance to discuss healthy lifestyle, exercise, vaccinations, review your chart record, and perform screenings where appropriate.  I recommend you see your eye doctor yearly for routine vision care.  I recommend you see your dentist yearly for routine dental care including hygiene visits twice yearly.   Vaccination recommendations were reviewed Immunization History  Administered Date(s) Administered   Influenza,inj,Quad PF,6+ Mos 12/23/2016, 12/27/2017, 01/02/2019, 12/13/2019, 12/30/2020   Influenza-Unspecified 02/13/2013, 01/05/2019, 01/05/2020   Moderna Sars-Covid-2 Vaccination 07/29/2019, 08/26/2019   Pneumococcal Conjugate-13 12/27/2017   Pneumococcal Polysaccharide-23 05/04/2019   Tdap 12/23/2016   Zoster Recombinant(Shingrix) 12/13/2019, 04/26/2020    I recommend a yearly flu shot. Declines flu shot.    Screening for cancer: Colon cancer screening: I reviewed your colonoscopy on file that is up to date from 2021, repeat in 10 years Will send home with FIT testing  We discussed PSA, prostate exam, and prostate cancer screening risks/benefits.     Skin cancer screening: Check your skin regularly for new changes, growing lesions, or other lesions of concern Come in for evaluation if you have skin lesions of concern.  Lung cancer screening: If you have a greater than 20 pack year history of tobacco use, then you may qualify for lung cancer screening with a chest CT scan.   Please call your insurance company to inquire about coverage for this test.  We currently don't have screenings for other cancers besides breast, cervical, colon, and lung cancers.  If you have a strong family history of cancer or have other cancer screening concerns, please let me know.    Bone health: Get at least 150 minutes of aerobic exercise weekly Get weight bearing exercise at least once weekly Bone density test:  A bone density test is an imaging test  that uses a type of X-ray to measure the amount of calcium and other minerals in your bones. The test may be used to diagnose or screen you for a condition that causes weak or thin bones (osteoporosis), predict your risk for a broken bone (fracture), or determine how well your osteoporosis treatment is working. The bone density test is recommended for females 65 and older, or females or males <65 if certain risk factors such as thyroid disease, long term use of steroids such as for asthma or rheumatological issues, vitamin D deficiency, estrogen deficiency, family history of osteoporosis, self or family history of fragility fracture in first degree relative.    Heart health: Get at least 150 minutes of aerobic exercise weekly Limit alcohol It is important to maintain a healthy blood pressure and healthy cholesterol numbers  Heart disease screening: Screening for heart disease includes screening for blood  pressure, fasting lipids, glucose/diabetes screening, BMI height to weight ratio, reviewed of smoking status, physical activity, and diet.    Goals include blood pressure 120/80 or less, maintaining a healthy lipid/cholesterol profile, preventing diabetes or keeping diabetes numbers under good control, not smoking or using tobacco products, exercising most days per week or at least 150 minutes per week of exercise, and eating healthy variety of fruits and vegetables, healthy oils, and avoiding unhealthy food choices like fried food, fast food, high sugar and high cholesterol foods.    CT coronary 03/2022 with score of 3 4mm left pulmonary nodule but given low risk, no further eval needed    Medical care options: I recommend you continue to seek care here first for routine care.  We try really hard to have available appointments Monday through Friday daytime hours for sick visits, acute visits, and physicals.  Urgent care should be used for after hours and weekends for significant issues that  cannot wait till the next day.  The emergency department should be used for significant potentially life-threatening emergencies.  The emergency department is expensive, can often have long wait times for less significant concerns, so try to utilize primary care, urgent care, or telemedicine when possible to avoid unnecessary trips to the emergency department.  Virtual visits and telemedicine have been introduced since the pandemic started in 2020, and can be convenient ways to receive medical care.  We offer virtual appointments as well to assist you in a variety of options to seek medical care.   Advanced Directives: I recommend you consider completing a Health Care Power of Attorney and Living Will.   These documents respect your wishes and help alleviate burdens on your loved ones if you were to become terminally ill or be in a position to need those documents enforced.    You can complete Advanced Directives yourself, have them notarized, then have copies made for our office, for you and for anybody you feel should have them in safe keeping.  Or, you can have an attorney prepare these documents.   If you haven't updated your Last Will and Testament in a while, it may be worthwhile having an attorney prepare these documents together and save on some costs.       Separate significant issues discussed: Hyperlipidemia-continue atorvastatin 10 mg, labs today  Kidney protection-continue enalapril 5 mg daily  Diabetes - continue Jardiance 25mg  daily, metformin 1000mg  BID  Anxiety and depression - continue Wellbutrin and Lexapro  Vit d deficiency - continue vitamin D supplement  Uses hearing aids  Hx/o alcohol abuse years ago, in remission   Otha was seen today for annual exam.  Diagnoses and all orders for this visit:  Encounter for health maintenance examination in adult -     Comprehensive metabolic panel -     CBC -     Lipid panel -     Hemoglobin A1c -     PSA -     POCT  Urinalysis DIP (Proadvantage Device) -     Microalbumin/Creatinine Ratio, Urine  Influenza vaccination declined  Wears hearing aid in both ears  Vitamin D deficiency  Type 2 diabetes mellitus with mild nonproliferative retinopathy without macular edema, unspecified laterality, unspecified whether long term insulin use (HCC) -     Hemoglobin A1c -     Microalbumin/Creatinine Ratio, Urine  Screening for prostate cancer -     PSA  GERD without esophagitis  Hyperlipidemia, unspecified hyperlipidemia type -  Lipid panel  Hypertension associated with diabetes (HCC)  Anxiety and depression  Erectile dysfunction, unspecified erectile dysfunction type  Colon cancer screening -     Fecal occult blood, imunochemical    Follow-up pending labs, yearly for physical

## 2023-01-28 NOTE — Patient Instructions (Signed)
This visit was a preventative care visit, also known as wellness visit or routine physical.   Topics typically include healthy lifestyle, diet, exercise, preventative care, vaccinations, sick and well care, proper use of emergency dept and after hours care, as well as other concerns.     Recommendations: Continue to return yearly for your annual wellness and preventative care visits.  This gives [place] a chance to discuss healthy lifestyle, exercise, vaccinations, review your chart record, and perform screenings where appropriate.  I recommend you see your eye doctor yearly for routine vision care.  I recommend you see your dentist yearly for routine dental care including hygiene visits twice yearly.   Vaccination recommendations were reviewed Immunization History  Administered Date(s) Administered   Influenza,inj,Quad PF,6+ Mos 12/23/2016, 12/27/2017, 01/02/2019, 12/13/2019, 12/30/2020   Influenza-Unspecified 02/13/2013, 01/05/2019, 01/05/2020   Moderna Sars-Covid-2 Vaccination 07/29/2019, 08/26/2019   Pneumococcal Conjugate-13 12/27/2017   Pneumococcal Polysaccharide-23 05/04/2019   Tdap 12/23/2016   Zoster Recombinant(Shingrix) 12/13/2019, 04/26/2020    I recommend a yearly flu shot. Declines flu shot.    Screening for cancer: Colon cancer screening: I reviewed your colonoscopy on file that is up to date from 2021, repeat in 10 years Will send home with FIT testing  We discussed PSA, prostate exam, and prostate cancer screening risks/benefits.     Skin cancer screening: Check your skin regularly for new changes, growing lesions, or other lesions of concern Come in for evaluation if you have skin lesions of concern.  Lung cancer screening: If you have a greater than 20 pack year history of tobacco use, then you may qualify for lung cancer screening with a chest CT scan.   Please call your insurance company to inquire about coverage for this test.  We currently don't have  screenings for other cancers besides breast, cervical, colon, and lung cancers.  If you have a strong family history of cancer or have other cancer screening concerns, please let me know.    Bone health: Get at least 150 minutes of aerobic exercise weekly Get weight bearing exercise at least once weekly Bone density test:  A bone density test is an imaging test that uses a type of X-ray to measure the amount of calcium and other minerals in your bones. The test may be used to diagnose or screen you for a condition that causes weak or thin bones (osteoporosis), predict your risk for a broken bone (fracture), or determine how well your osteoporosis treatment is working. The bone density test is recommended for females 65 and older, or females or males <65 if certain risk factors such as thyroid disease, long term use of steroids such as for asthma or rheumatological issues, vitamin D deficiency, estrogen deficiency, family history of osteoporosis, self or family history of fragility fracture in first degree relative.    Heart health: Get at least 150 minutes of aerobic exercise weekly Limit alcohol It is important to maintain a healthy blood pressure and healthy cholesterol numbers  Heart disease screening: Screening for heart disease includes screening for blood pressure, fasting lipids, glucose/diabetes screening, BMI height to weight ratio, reviewed of smoking status, physical activity, and diet.    Goals include blood pressure 120/80 or less, maintaining a healthy lipid/cholesterol profile, preventing diabetes or keeping diabetes numbers under good control, not smoking or using tobacco products, exercising most days per week or at least 150 minutes per week of exercise, and eating healthy variety of fruits and vegetables, healthy oils, and avoiding unhealthy food choices like  fried food, fast food, high sugar and high cholesterol foods.    CT coronary 03/2022 with score of 3 4mm left  pulmonary nodule but given low risk, no further eval needed    Medical care options: I recommend you continue to seek care here first for routine care.  We try really hard to have available appointments Monday through Friday daytime hours for sick visits, acute visits, and physicals.  Urgent care should be used for after hours and weekends for significant issues that cannot wait till the next day.  The emergency department should be used for significant potentially life-threatening emergencies.  The emergency department is expensive, can often have long wait times for less significant concerns, so try to utilize primary care, urgent care, or telemedicine when possible to avoid unnecessary trips to the emergency department.  Virtual visits and telemedicine have been introduced since the pandemic started in 2020, and can be convenient ways to receive medical care.  We offer virtual appointments as well to assist you in a variety of options to seek medical care.   Advanced Directives: I recommend you consider completing a Health Care Power of Attorney and Living Will.   These documents respect your wishes and help alleviate burdens on your loved ones if you were to become terminally ill or be in a position to need those documents enforced.    You can complete Advanced Directives yourself, have them notarized, then have copies made for our office, for you and for anybody you feel should have them in safe keeping.  Or, you can have an attorney prepare these documents.   If you haven't updated your Last Will and Testament in a while, it may be worthwhile having an attorney prepare these documents together and save on some costs.       Separate significant issues discussed: Hyperlipidemia-continue atorvastatin 10 mg, labs today  Kidney protection-continue enalapril 5 mg daily  Diabetes - continue Jardiance 25mg  daily, metformin 1000mg  BID  Anxiety and depression - continue Wellbutrin and  Lexapro  Vit d deficiency - continue vitamin D supplement  Uses hearing aids  Hx/o alcohol abuse years ago, in remission

## 2023-01-29 ENCOUNTER — Other Ambulatory Visit: Payer: Self-pay | Admitting: Medical

## 2023-01-29 DIAGNOSIS — E1165 Type 2 diabetes mellitus with hyperglycemia: Secondary | ICD-10-CM

## 2023-01-29 DIAGNOSIS — F419 Anxiety disorder, unspecified: Secondary | ICD-10-CM

## 2023-01-29 DIAGNOSIS — K219 Gastro-esophageal reflux disease without esophagitis: Secondary | ICD-10-CM

## 2023-01-29 DIAGNOSIS — E1159 Type 2 diabetes mellitus with other circulatory complications: Secondary | ICD-10-CM

## 2023-01-29 LAB — COMPREHENSIVE METABOLIC PANEL
ALT: 24 [IU]/L (ref 0–44)
AST: 17 [IU]/L (ref 0–40)
Albumin: 4.7 g/dL (ref 3.8–4.9)
Alkaline Phosphatase: 86 [IU]/L (ref 44–121)
BUN/Creatinine Ratio: 15 (ref 9–20)
BUN: 13 mg/dL (ref 6–24)
Bilirubin Total: 0.3 mg/dL (ref 0.0–1.2)
CO2: 21 mmol/L (ref 20–29)
Calcium: 9.4 mg/dL (ref 8.7–10.2)
Chloride: 100 mmol/L (ref 96–106)
Creatinine, Ser: 0.88 mg/dL (ref 0.76–1.27)
Globulin, Total: 2 g/dL (ref 1.5–4.5)
Glucose: 164 mg/dL — ABNORMAL HIGH (ref 70–99)
Potassium: 4.1 mmol/L (ref 3.5–5.2)
Sodium: 139 mmol/L (ref 134–144)
Total Protein: 6.7 g/dL (ref 6.0–8.5)
eGFR: 101 mL/min/{1.73_m2} (ref >59)

## 2023-01-29 LAB — MICROALBUMIN / CREATININE URINE RATIO
Creatinine, Urine: 36.7 mg/dL
Microalb/Creat Ratio: <8 mg/g{creat} (ref 0–29)
Microalbumin, Urine: <3 ug/mL

## 2023-01-29 LAB — LIPID PANEL
Chol/HDL Ratio: 3.6 ratio (ref 0.0–5.0)
Cholesterol, Total: 115 mg/dL (ref 100–199)
HDL: 32 mg/dL — ABNORMAL LOW (ref >39)
LDL Chol Calc (NIH): 65 mg/dL (ref 0–99)
Triglycerides: 94 mg/dL (ref 0–149)
VLDL Cholesterol Cal: 18 mg/dL (ref 5–40)

## 2023-01-29 LAB — CBC
Hematocrit: 49.6 % (ref 37.5–51.0)
Hemoglobin: 16.4 g/dL (ref 13.0–17.7)
MCH: 31.2 pg (ref 26.6–33.0)
MCHC: 33.1 g/dL (ref 31.5–35.7)
MCV: 94 fL (ref 79–97)
Platelets: 244 10*3/uL (ref 150–450)
RBC: 5.26 x10E6/uL (ref 4.14–5.80)
RDW: 12.9 % (ref 11.6–15.4)
WBC: 6 10*3/uL (ref 3.4–10.8)

## 2023-01-29 LAB — HEMOGLOBIN A1C
Est. average glucose Bld gHb Est-mCnc: 229 mg/dL
Hgb A1c MFr Bld: 9.6 % — ABNORMAL HIGH (ref 4.8–5.6)

## 2023-01-29 LAB — PSA: Prostate Specific Ag, Serum: 3.2 ng/mL (ref 0.0–4.0)

## 2023-01-29 MED ORDER — VITAMIN D (CHOLECALCIFEROL) 25 MCG (1000 UT) PO TABS: 1.0000 | ORAL_TABLET | Freq: Every day | ORAL | 3 refills | Status: DC

## 2023-01-29 MED ORDER — ATORVASTATIN CALCIUM 10 MG PO TABS: 10.0000 mg | ORAL_TABLET | Freq: Every day | ORAL | 3 refills | Status: DC

## 2023-01-29 MED ORDER — ESCITALOPRAM OXALATE 20 MG PO TABS: 20.0000 mg | ORAL_TABLET | Freq: Every day | ORAL | 3 refills | Status: DC

## 2023-01-29 MED ORDER — EMPAGLIFLOZIN 25 MG PO TABS: ORAL_TABLET | ORAL | 3 refills | Status: DC

## 2023-01-29 MED ORDER — METFORMIN HCL 1000 MG PO TABS: 1000.0000 mg | ORAL_TABLET | Freq: Two times a day (BID) | ORAL | 3 refills | Status: DC

## 2023-01-29 MED ORDER — ENALAPRIL MALEATE 5 MG PO TABS: 5.0000 mg | ORAL_TABLET | Freq: Every day | ORAL | 3 refills | Status: DC

## 2023-01-29 MED ORDER — SILDENAFIL CITRATE 100 MG PO TABS: 50.0000 mg | ORAL_TABLET | Freq: Every day | ORAL | 5 refills | Status: DC | PRN

## 2023-01-29 MED ORDER — BUPROPION HCL ER (SR) 100 MG PO TB12: 100.0000 mg | ORAL_TABLET | Freq: Every day | ORAL | 3 refills | Status: DC

## 2023-01-29 MED ORDER — OMEPRAZOLE 40 MG PO CPDR: 40.0000 mg | DELAYED_RELEASE_CAPSULE | Freq: Every day | ORAL | 3 refills | Status: DC

## 2023-01-29 NOTE — Progress Notes (Signed)
Results sent through MyChart

## 2023-02-01 ENCOUNTER — Telehealth: Payer: Self-pay

## 2023-02-01 ENCOUNTER — Other Ambulatory Visit: Payer: Self-pay | Admitting: Medical

## 2023-02-01 MED ORDER — TRULICITY 0.75 MG/0.5ML ~~LOC~~ SOAJ: 0.7500 mg | SUBCUTANEOUS | 1 refills | Status: DC

## 2023-02-01 NOTE — Telephone Encounter (Signed)
Pt called his insurance and they will cover trulicity and mounjaro

## 2023-02-11 LAB — HM DIABETES EYE EXAM

## 2023-02-17 ENCOUNTER — Encounter: Payer: Self-pay | Admitting: *Deleted

## 2023-02-23 ENCOUNTER — Encounter (INDEPENDENT_AMBULATORY_CARE_PROVIDER_SITE_OTHER): Payer: 59 | Admitting: Ophthalmology

## 2023-02-23 DIAGNOSIS — H35033 Hypertensive retinopathy, bilateral: Secondary | ICD-10-CM

## 2023-02-23 DIAGNOSIS — I10 Essential (primary) hypertension: Secondary | ICD-10-CM

## 2023-02-23 DIAGNOSIS — H25813 Combined forms of age-related cataract, bilateral: Secondary | ICD-10-CM

## 2023-02-23 DIAGNOSIS — E113293 Type 2 diabetes mellitus with mild nonproliferative diabetic retinopathy without macular edema, bilateral: Secondary | ICD-10-CM

## 2023-02-23 DIAGNOSIS — Z7984 Long term (current) use of oral hypoglycemic drugs: Secondary | ICD-10-CM

## 2023-02-23 NOTE — Progress Notes (Signed)
[facility] - Clinic Note  03/09/2023    CHIEF COMPLAINT Patient presents for Retina Follow Up    HISTORY OF PRESENT ILLNESS: Jacob Hart is a 57 y.o. male who presents to the clinic today for:   HPI     Retina Follow Up   Patient presents with  Diabetic Retinopathy.  In both eyes.  Severity is moderate.  Duration of 6 months.  Since onset it is stable.  I, the attending physician,  performed the HPI with the patient and updated documentation appropriately.        Comments   Patient states vision the same OU. BS was 153 this am. Last A1c was 9.6, checked a month ago. Added trulicity once a week injection to help lower A1c.      Last edited by Rennis Chris, MD on 03/10/2023 11:55 PM.    Pt states he went off Trulicity because it was making him lose weight, but his A1c went up to 9.6 and his fasting blood sugars were up, he re-started Trulicity about 30 days ago, he is also on metformin and jardiance  Referring physician: Jac Canavan, PA-C [id number] [place],  [place] [address]  HISTORICAL INFORMATION:   Selected notes from the MEDICAL RECORD NUMBER Referred by Hetty Blend, NP-C for DM exam LEE:  Ocular Hx- PMH-DM (last A1C: 7.0, taking jardiance, metformin) HTN, anxiety, depression    CURRENT MEDICATIONS: No current outpatient medications on file. (Ophthalmic Drugs)   No current facility-administered medications for this visit. (Ophthalmic Drugs)   Current Outpatient Medications (Other)  Medication Sig   atorvastatin (LIPITOR) 10 MG tablet Take 1 tablet (10 mg total) by mouth daily.   buPROPion ER (WELLBUTRIN SR) 100 MG 12 hr tablet Take 1 tablet (100 mg total) by mouth daily.   Dulaglutide (TRULICITY) 0.75 MG/0.5ML SOAJ Inject 0.75 mg into the skin once a week.   empagliflozin (JARDIANCE) 25 MG TABS tablet TAKE 1 TABLET (25 MG TOTAL) BY MOUTH DAILY BEFORE BREAKFAST.   enalapril (VASOTEC) 5 MG tablet Take 1 tablet (5 mg  total) by mouth daily.   escitalopram (LEXAPRO) 20 MG tablet Take 1 tablet (20 mg total) by mouth daily.   Ginkgo Biloba 40 MG TABS Take 1 tablet by mouth in the morning and at bedtime.   metFORMIN (GLUCOPHAGE) 1000 MG tablet Take 1 tablet (1,000 mg total) by mouth 2 (two) times daily with a meal.   omeprazole (PRILOSEC) 40 MG capsule Take 1 capsule (40 mg total) by mouth daily.   sildenafil (VIAGRA) 100 MG tablet Take 0.5-1 tablets (50-100 mg total) by mouth daily as needed for erectile dysfunction.   Vitamin D, Cholecalciferol, 25 MCG (1000 UT) TABS Take 1 tablet by mouth daily.   No current facility-administered medications for this visit. (Other)   REVIEW OF SYSTEMS: ROS   Positive for: Neurological, Endocrine, Eyes Negative for: Constitutional, Gastrointestinal, Skin, Genitourinary, Musculoskeletal, HENT, Cardiovascular, Respiratory, Psychiatric, Allergic/Imm, Heme/Lymph Last edited by Annalee Genta D, COT on 03/09/2023  7:41 AM.     ALLERGIES Allergies  Allergen Reactions   Sulfa Antibiotics Hives   PAST MEDICAL HISTORY Past Medical History:  Diagnosis Date   Allergy    Anxiety and depression 12/23/2016   Controlled type 2 diabetes mellitus without complication, without long-term current use of insulin (HCC) 12/23/2016   GERD (gastroesophageal reflux disease) 12/23/2016   History of esophageal stricture    dilation done in 2016 at [facility]   Hyperlipidemia  Hypertension associated with diabetes (HCC) 12/23/2016   Substance abuse (HCC)    prior alcoholic, last drink 10/2007   Uncontrolled diabetes mellitus type 2 without complications 12/23/2016   Vitamin D deficiency    Past Surgical History:  Procedure Laterality Date   ABDOMINAL SURGERY     as a child, sphincter surgery   COLONOSCOPY  06/2019   hemorrhoids, otherwise normal, Dr. Tressia Danas   ESOPHAGOGASTRODUODENOSCOPY  08/02/2014   mild chronic nonspecific gastritis, stomach and endoscopic biopises.  reflux esophagitis   ESOPHAGOGASTRODUODENOSCOPY (EGD) WITH ESOPHAGEAL DILATION     mild chronic stomach gastritis per record in 2016   NASAL SEPTUM SURGERY     TONSILLECTOMY     VASECTOMY     FAMILY HISTORY Family History  Problem Relation Age of Onset   Lung cancer Mother 8   Prostate cancer Father 45   Bladder Cancer Father    Lung cancer Maternal Grandfather    Colon cancer Paternal Grandmother    CAD Brother    SOCIAL HISTORY Social History   Tobacco Use   Smoking status: Never   Smokeless tobacco: Current    Types: Snuff  Vaping Use   Vaping status: Never Used  Substance Use Topics   Alcohol use: No    Comment: recovering alcoholic, 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   Drug use: No       OPHTHALMIC EXAM: Base Eye Exam     Visual Acuity (Snellen - Linear)       Right Left   Dist [place] 20/25 20/30   Dist ph [place] 20/25 +1 20/20         Tonometry (Tonopen, 7:44 AM)       Right Left   Pressure 12 13         Pupils       Dark Light Shape React APD   Right 5 4 Round Slow None   Left 6 5 Round Slow None         Visual Fields       Left Right    Full Full         Extraocular Movement       Right Left    Full, Ortho Full, Ortho         Neuro/Psych     Oriented x3: Yes   Mood/Affect: Normal         Dilation     Both eyes: 1.0% Mydriacyl, 2.5% Phenylephrine @ 7:44 AM           Slit Lamp and Fundus Exam     Slit Lamp Exam       Right Left   Lids/Lashes Dermatochalasis - upper lid Dermatochalasis - upper lid   Conjunctiva/Sclera White and quiet White and quiet   Cornea Tear film debris mild tear film debris   Anterior Chamber Deep and quiet Deep and quiet   Iris Round and dilated, No NVI Round and dilated, No NVI   Lens 2+ Nuclear sclerosis, 2+ Cortical cataract 2+ Nuclear sclerosis, 2+ Cortical cataract   Anterior Vitreous Mild syneresis Mild syneresis         Fundus Exam       Right Left   Disc Tilted disc, Temporal Peripapillary  atrophy, Pink and Sharp Tilted disc, Temporal Peripapillary atrophy, Pink and Sharp   C/D Ratio 0.5 0.6   Macula Flat, blunted foveal reflex, mild Retinal pigment epithelial mottling, scattered Microaneurysms / DBH blunted foveal reflex, scattered MA, focal punctate exudates and cystic changes  temporal macula   Vessels mild attenuation, mild tortuosity, no NV attenuated, mild tortuosity   Periphery Attached, rare MA Attached, rare MA            IMAGING AND PROCEDURES  Imaging and Procedures for @TODAY @  OCT, Retina - OU - Both Eyes       Right Eye Quality was good. Central Foveal Thickness: 321. Progression has been stable. Findings include normal foveal contour, no IRF, no SRF, vitreomacular adhesion (No DME).   Left Eye Quality was good. Central Foveal Thickness: 323. Progression has improved. Findings include normal foveal contour, no SRF, intraretinal hyper-reflective material, intraretinal fluid, vitreomacular adhesion (Blunted foveal contour, interval improvement in focal IRF/IRHM temp mac).   Notes *Images captured and stored on drive  Diagnosis / Impression:  OD: NFP; no IRF/SRF; No DME OS: Blunted foveal contour, interval improvement in focal IRF/IRHM temp mac  Clinical management:  See below  Abbreviations: NFP - Normal foveal profile. CME - cystoid macular edema. PED - pigment epithelial detachment. IRF - intraretinal fluid. SRF - subretinal fluid. EZ - ellipsoid zone. ERM - epiretinal membrane. ORA - outer retinal atrophy. ORT - outer retinal tubulation. SRHM - subretinal hyper-reflective material              ASSESSMENT/PLAN:    ICD-10-CM   1. 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 Current use of insulin (HCC)  Z79.4     3. Long term (current) use of oral hypoglycemic drugs  Z79.84     4. Essential hypertension  I10      5. Hypertensive retinopathy of both eyes  H35.033     6. Combined forms of age-related cataract of both eyes  H25.813       1-3. Mild Non-proliferative diabetic retinopathy OU  - OS w/ mild focal DME/cystic changes temporal fovea/macula -- improved today  - A1c: 9.6 on 10.24.24 - exam shows scattered MA OU; no NV - FA (11.18.22) shows no NV OU, OS with prominent MA temporal to fovea -- potential focal laser target - BCVA OD: stable at 20/25, OS improved to 20/20 from 20/20 - OCT shows: OD: No DME OS: Blunted foveal contour, interval improvement in focal IRF/IRHM temp mac - no treatment recommended at this time - monitor - f/u in 6 months, DFE, OCT  4,5. Hypertensive retinopathy OU - discussed importance of tight BP control - monitor  6. Combined form age-related cataract OU - The symptoms of cataract, surgical options, and treatments and risks were discussed with patient. - discussed diagnosis and progression - monitor  Ophthalmic Meds Ordered this visit:  No orders of the defined types were placed in this encounter.    Return in about 6 months (around 09/07/2023) for NPDR OU, DFE, OCT.  There are no Patient Instructions on file for this visit.   Explained the diagnoses, plan, and follow up with the patient and they expressed understanding.  Patient expressed understanding of the importance of proper follow up care.   This document serves as a record of services personally performed by Karie Chimera, MD, PhD. It was created on their behalf by Charlette Caffey, COT an ophthalmic technician. The creation of this record is the provider's dictation and/or activities during the visit.    Electronically signed by:  Charlette Caffey, COT  03/10/23 11:56 PM  This document serves as  a record of services personally performed by Karie Chimera, MD, PhD. It was created on their behalf by Glee Arvin. Manson Passey, OA an ophthalmic technician. The creation of this record is the provider's  dictation and/or activities during the visit.    Electronically signed by: Glee Arvin. Manson Passey, OA 03/10/23 11:56 PM  Karie Chimera, M.D., Ph.D. Diseases & Surgery of the Retina and Vitreous [facility]  I have reviewed the above documentation for accuracy and completeness, and I agree with the above. Karie Chimera, M.D., Ph.D. 03/10/23 11:58 PM     Abbreviations: M myopia (nearsighted); A astigmatism; H hyperopia (farsighted); P presbyopia; Mrx spectacle prescription;  CTL contact lenses; OD right eye; OS left eye; OU both eyes  XT exotropia; ET esotropia; PEK punctate epithelial keratitis; PEE punctate epithelial erosions; DES dry eye syndrome; MGD meibomian gland dysfunction; ATs artificial tears; PFAT's preservative free artificial tears; NSC nuclear sclerotic cataract; PSC posterior subcapsular cataract; ERM epi-retinal membrane; PVD posterior vitreous detachment; RD retinal detachment; DM diabetes mellitus; DR diabetic retinopathy; NPDR non-proliferative diabetic retinopathy; PDR proliferative diabetic retinopathy; CSME clinically significant macular edema; DME diabetic macular edema; dbh dot blot hemorrhages; CWS cotton wool spot; POAG primary open angle glaucoma; C/D cup-to-disc ratio; HVF humphrey visual field; GVF goldmann visual field; OCT optical coherence tomography; IOP intraocular pressure; BRVO Branch retinal vein occlusion; CRVO central retinal vein occlusion; CRAO central retinal artery occlusion; BRAO branch retinal artery occlusion; RT retinal tear; SB scleral buckle; PPV pars plana vitrectomy; VH Vitreous hemorrhage; PRP panretinal laser photocoagulation; IVK intravitreal kenalog; VMT vitreomacular traction; MH Macular hole;  NVD neovascularization of the disc; NVE neovascularization elsewhere; AREDS age related eye disease study; ARMD age related macular degeneration; POAG primary open angle glaucoma; EBMD epithelial/anterior basement membrane dystrophy; ACIOL  anterior chamber intraocular lens; IOL intraocular lens; PCIOL posterior chamber intraocular lens; Phaco/IOL phacoemulsification with intraocular lens placement; PRK photorefractive keratectomy; LASIK laser assisted in situ keratomileusis; HTN hypertension; DM diabetes mellitus; COPD chronic obstructive pulmonary disease

## 2023-03-09 ENCOUNTER — Encounter (INDEPENDENT_AMBULATORY_CARE_PROVIDER_SITE_OTHER): Payer: Self-pay | Admitting: Ophthalmology

## 2023-03-09 ENCOUNTER — Ambulatory Visit (INDEPENDENT_AMBULATORY_CARE_PROVIDER_SITE_OTHER): Payer: 59 | Admitting: Ophthalmology

## 2023-03-09 DIAGNOSIS — Z7984 Long term (current) use of oral hypoglycemic drugs: Secondary | ICD-10-CM

## 2023-03-09 DIAGNOSIS — H35033 Hypertensive retinopathy, bilateral: Secondary | ICD-10-CM

## 2023-03-09 DIAGNOSIS — Z794 Long term (current) use of insulin: Secondary | ICD-10-CM

## 2023-03-09 DIAGNOSIS — I10 Essential (primary) hypertension: Secondary | ICD-10-CM | POA: Diagnosis not present

## 2023-03-09 DIAGNOSIS — E113293 Type 2 diabetes mellitus with mild nonproliferative diabetic retinopathy without macular edema, bilateral: Secondary | ICD-10-CM | POA: Diagnosis not present

## 2023-03-09 DIAGNOSIS — H25813 Combined forms of age-related cataract, bilateral: Secondary | ICD-10-CM

## 2023-03-10 ENCOUNTER — Encounter (INDEPENDENT_AMBULATORY_CARE_PROVIDER_SITE_OTHER): Payer: Self-pay | Admitting: Ophthalmology

## 2023-03-20 ENCOUNTER — Other Ambulatory Visit: Payer: Self-pay | Admitting: Medical

## 2023-03-21 ENCOUNTER — Other Ambulatory Visit: Payer: Self-pay | Admitting: Medical

## 2023-03-21 DIAGNOSIS — F32A Depression, unspecified: Secondary | ICD-10-CM

## 2023-03-21 DIAGNOSIS — K219 Gastro-esophageal reflux disease without esophagitis: Secondary | ICD-10-CM

## 2023-04-16 ENCOUNTER — Other Ambulatory Visit: Payer: Self-pay | Admitting: Internal Medicine

## 2023-04-16 MED ORDER — TRULICITY 0.75 MG/0.5ML ~~LOC~~ SOAJ: 0.7500 mg | SUBCUTANEOUS | 0 refills | Status: DC

## 2023-04-25 ENCOUNTER — Other Ambulatory Visit: Payer: Self-pay | Admitting: Medical

## 2023-04-25 DIAGNOSIS — E1159 Type 2 diabetes mellitus with other circulatory complications: Secondary | ICD-10-CM

## 2023-05-11 ENCOUNTER — Other Ambulatory Visit: Payer: Self-pay | Admitting: Medical

## 2023-08-16 ENCOUNTER — Other Ambulatory Visit: Payer: Self-pay | Admitting: Medical

## 2023-08-16 DIAGNOSIS — F32A Depression, unspecified: Secondary | ICD-10-CM

## 2023-08-16 DIAGNOSIS — K219 Gastro-esophageal reflux disease without esophagitis: Secondary | ICD-10-CM

## 2023-08-17 NOTE — Telephone Encounter (Signed)
 This was filled on 03/22/23 for 6 months. Shouldn't be out until June. Pt has an appt 5/21

## 2023-08-24 ENCOUNTER — Other Ambulatory Visit: Payer: Self-pay | Admitting: Medical

## 2023-08-25 ENCOUNTER — Ambulatory Visit: Admitting: Medical

## 2023-08-25 VITALS — BP 130/64 | HR 84 | Wt 182.8 lb

## 2023-08-25 DIAGNOSIS — R972 Elevated prostate specific antigen [PSA]: Secondary | ICD-10-CM | POA: Insufficient documentation

## 2023-08-25 DIAGNOSIS — I152 Hypertension secondary to endocrine disorders: Secondary | ICD-10-CM

## 2023-08-25 DIAGNOSIS — E559 Vitamin D deficiency, unspecified: Secondary | ICD-10-CM

## 2023-08-25 DIAGNOSIS — F32A Depression, unspecified: Secondary | ICD-10-CM

## 2023-08-25 DIAGNOSIS — E1165 Type 2 diabetes mellitus with hyperglycemia: Secondary | ICD-10-CM | POA: Diagnosis not present

## 2023-08-25 DIAGNOSIS — E785 Hyperlipidemia, unspecified: Secondary | ICD-10-CM

## 2023-08-25 DIAGNOSIS — F419 Anxiety disorder, unspecified: Secondary | ICD-10-CM | POA: Diagnosis not present

## 2023-08-25 DIAGNOSIS — E1159 Type 2 diabetes mellitus with other circulatory complications: Secondary | ICD-10-CM | POA: Diagnosis not present

## 2023-08-25 DIAGNOSIS — E113299 Type 2 diabetes mellitus with mild nonproliferative diabetic retinopathy without macular edema, unspecified eye: Secondary | ICD-10-CM

## 2023-08-25 DIAGNOSIS — K219 Gastro-esophageal reflux disease without esophagitis: Secondary | ICD-10-CM

## 2023-08-25 DIAGNOSIS — Z974 Presence of external hearing-aid: Secondary | ICD-10-CM

## 2023-08-25 LAB — POCT GLYCOSYLATED HEMOGLOBIN (HGB A1C): Hemoglobin A1C: 6.6 % — AB (ref 4.0–5.6)

## 2023-08-25 MED ORDER — ENALAPRIL MALEATE 5 MG PO TABS: 5.0000 mg | ORAL_TABLET | Freq: Every day | ORAL | 3 refills | Status: DC

## 2023-08-25 MED ORDER — ESCITALOPRAM OXALATE 20 MG PO TABS: 20.0000 mg | ORAL_TABLET | Freq: Every day | ORAL | 3 refills | Status: DC

## 2023-08-25 MED ORDER — OMEPRAZOLE 40 MG PO CPDR
40.0000 mg | DELAYED_RELEASE_CAPSULE | Freq: Every day | ORAL | 3 refills | Status: AC
Start: 2023-08-25 — End: ?

## 2023-08-25 MED ORDER — BUPROPION HCL ER (SR) 100 MG PO TB12: 100.0000 mg | ORAL_TABLET | Freq: Every day | ORAL | 3 refills | Status: DC

## 2023-08-25 MED ORDER — TRULICITY 1.5 MG/0.5ML ~~LOC~~ SOAJ: 1.5000 mg | SUBCUTANEOUS | 0 refills | Status: DC

## 2023-08-25 MED ORDER — TRULICITY 3 MG/0.5ML ~~LOC~~ SOAJ: 3.0000 mg | SUBCUTANEOUS | 0 refills | Status: DC

## 2023-08-25 MED ORDER — ATORVASTATIN CALCIUM 10 MG PO TABS: 10.0000 mg | ORAL_TABLET | Freq: Every day | ORAL | 3 refills | Status: DC

## 2023-08-25 NOTE — Progress Notes (Signed)
 Subjective:  Jacob Hart is a 58 y.o. male who presents for Chief Complaint  Patient presents with   Follow-up    Diabetes f/up and med ck      Here for med check.  Diabetes-compliant with metformin  1000 mg twice daily, Jardiance  25 mg daily and Trulicity  0.75 mg weekly which was started last visit in October 2024  Hypertension-compliant with enalapril  daily  Hyperlipidemia-compliant with Lipitor daily  Compliant with vitamin D  supplement  Compliant with Lexapro  and Wellbutrin  without complaint  Here for follow-up on elevated PSA from last visit as well  No other aggravating or relieving factors.    No other c/o.  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 90 tablet 3   Ginkgo Biloba 40 MG TABS Take 1 tablet by mouth in the morning and at bedtime.     metFORMIN  (GLUCOPHAGE ) 1000 MG tablet TAKE 1 TABLET BY MOUTH TWICE  DAILY WITH MEALS 180 tablet 1   sildenafil  (VIAGRA ) 100 MG tablet Take 0.5-1 tablets (50-100 mg total) by mouth daily as needed for erectile dysfunction. 30 tablet 5   Vitamin D , Cholecalciferol , 25 MCG (1000 UT) TABS Take 1 tablet by mouth daily. (Patient not taking: Reported on 08/25/2023) 90 tablet 3   No current facility-administered  medications on file prior to visit.     The following portions of the patient's history were reviewed and updated as appropriate: allergies, current medications, past family history, past medical history, past social history, past surgical history and problem list.  ROS Otherwise as in subjective above  Objective: BP 130/64   Pulse 84   Wt 182 lb 12.8 oz (82.9 kg)   SpO2 98%   BMI 26.23 kg/m   General appearance: alert, no distress, well developed, well nourished Heart: RRR, normal S1, S2, no murmurs Lungs: CTA bilaterally, no wheezes, rhonchi, or rales Pulses: 2+ radial pulses, 2+ pedal pulses, normal cap refill Ext: no edema   Assessment: Encounter Diagnoses  Name Primary?   Uncontrolled type 2 diabetes mellitus with hyperglycemia (HCC) Yes   Elevated PSA    Hypertension associated with diabetes (HCC)    Anxiety and depression    GERD without esophagitis    Type 2 diabetes mellitus with mild nonproliferative retinopathy without macular edema, unspecified laterality, unspecified whether long term insulin use (HCC)    Wears hearing aid in both ears    Vitamin D  deficiency    Hyperlipidemia, unspecified hyperlipidemia type      Plan: Diabetes-hemoglobin A1c much improved from last visit.  Hemoglobin A1c 6.6% today.  Increase Trulicity  to 1.5 mg weekly for the next month then increase to Trulicity  3 mg after that.  We discussed monitoring blood sugars.  If blood sugars start running or close to 70 or lower  we will back off metformin  to half dose.  He is currently on 1000 mg twice daily of metformin  and Jardiance  25 mg daily  Elevated PSA-our lab personnel was not here today so he will go at a lab draw station for updated PSA given the slightly elevated PSA last visit  Hypertension-continue enalapril  5 mg daily  Hyperlipidemia-continue Lipitor 10 mg daily  Continue vitamin D  supplement  Continue other medicines as usual for mood   Jamarii was seen today for  follow-up.  Diagnoses and all orders for this visit:  Uncontrolled type 2 diabetes mellitus with hyperglycemia (HCC) -     HgB A1c  Elevated PSA -     PSA, total and free  Hypertension associated with diabetes (HCC) -     enalapril  (VASOTEC ) 5 MG tablet; Take 1 tablet (5 mg total) by mouth daily.  Anxiety and depression -     buPROPion  ER (WELLBUTRIN  SR) 100 MG 12 hr tablet; Take 1 tablet (100 mg total) by mouth daily.  GERD without esophagitis -     omeprazole  (PRILOSEC) 40 MG capsule; Take 1 capsule (40 mg total) by mouth daily.  Type 2 diabetes mellitus with mild nonproliferative retinopathy without macular edema, unspecified laterality, unspecified whether long term insulin use (HCC)  Wears hearing aid in both ears  Vitamin D  deficiency  Hyperlipidemia, unspecified hyperlipidemia type  Other orders -     Dulaglutide  (TRULICITY ) 1.5 MG/0.5ML SOAJ; Inject 1.5 mg into the skin once a week. -     Dulaglutide  (TRULICITY ) 3 MG/0.5ML SOAJ; Inject 3 mg as directed once a week. -     escitalopram  (LEXAPRO ) 20 MG tablet; Take 1 tablet (20 mg total) by mouth daily. -     atorvastatin  (LIPITOR) 10 MG tablet; Take 1 tablet (10 mg total) by mouth daily.    Follow up: pending labs

## 2023-08-26 NOTE — Progress Notes (Shared)
 [facility] - Clinic Note  09/07/2023    CHIEF COMPLAINT Patient presents for No chief complaint on file.    HISTORY OF PRESENT ILLNESS: Jacob Hart is a 58 y.o. male who presents to the clinic today for:    Pt states he went off Trulicity  because it was making him lose weight, but his A1c went up to 9.6 and his fasting blood sugars were up, he re-started Trulicity  about 30 days ago, he is also on metformin  and jardiance   Referring physician: Claudene Crystal, PA-C [address] [place],  [place] [address]  HISTORICAL INFORMATION:   Selected notes from the MEDICAL RECORD NUMBER Referred by Alyson Back, NP-C for DM exam LEE:  Ocular Hx- PMH-DM (last A1C: 7.0, taking jardiance , metformin ) HTN, anxiety, depression    CURRENT MEDICATIONS: No current outpatient medications on file. (Ophthalmic Drugs)   No current facility-administered medications for this visit. (Ophthalmic Drugs)   Current Outpatient Medications (Other)  Medication Sig   atorvastatin  (LIPITOR) 10 MG tablet Take 1 tablet (10 mg total) by mouth daily.   buPROPion  ER (WELLBUTRIN  SR) 100 MG 12 hr tablet Take 1 tablet (100 mg total) by mouth daily.   Dulaglutide  (TRULICITY ) 1.5 MG/0.5ML SOAJ Inject 1.5 mg into the skin once a week.   Dulaglutide  (TRULICITY ) 3 MG/0.5ML SOAJ Inject 3 mg as directed once a week.   empagliflozin  (JARDIANCE ) 25 MG TABS tablet TAKE 1 TABLET (25 MG TOTAL) BY MOUTH DAILY BEFORE BREAKFAST.   enalapril  (VASOTEC ) 5 MG tablet Take 1 tablet (5 mg total) by mouth daily.   escitalopram  (LEXAPRO ) 20 MG tablet Take 1 tablet (20 mg total) by mouth daily.   Ginkgo Biloba 40 MG TABS Take 1 tablet by mouth in the morning and at bedtime.   metFORMIN  (GLUCOPHAGE ) 1000 MG tablet TAKE 1 TABLET BY MOUTH TWICE  DAILY WITH MEALS   omeprazole  (PRILOSEC) 40 MG capsule Take 1 capsule (40 mg total) by mouth daily.   sildenafil  (VIAGRA ) 100 MG tablet Take 0.5-1 tablets (50-100 mg  total) by mouth daily as needed for erectile dysfunction.   Vitamin D , Cholecalciferol , 25 MCG (1000 UT) TABS Take 1 tablet by mouth daily. (Patient not taking: Reported on 08/25/2023)   No current facility-administered medications for this visit. (Other)   REVIEW OF SYSTEMS:   ALLERGIES Allergies  Allergen Reactions   Sulfa Antibiotics Hives   PAST MEDICAL HISTORY Past Medical History:  Diagnosis Date   Allergy    Anxiety and depression 12/23/2016   Controlled type 2 diabetes mellitus without complication, without long-term current use of insulin (HCC) 12/23/2016   GERD (gastroesophageal reflux disease) 12/23/2016   History of esophageal stricture    dilation done in 2016 at [facility]   Hyperlipidemia    Hypertension associated with diabetes (HCC) 12/23/2016   Substance abuse (HCC)    prior alcoholic, last drink 10/2007   Uncontrolled diabetes mellitus type 2 without complications 12/23/2016   Vitamin D  deficiency    Past Surgical History:  Procedure Laterality Date   ABDOMINAL SURGERY     as a child, sphincter surgery   COLONOSCOPY  06/2019   hemorrhoids, otherwise normal, Dr. Lindle Rhea   ESOPHAGOGASTRODUODENOSCOPY  08/02/2014   mild chronic nonspecific gastritis, stomach and endoscopic biopises. reflux esophagitis   ESOPHAGOGASTRODUODENOSCOPY (EGD) WITH ESOPHAGEAL DILATION     mild chronic stomach gastritis per record in 2016   NASAL SEPTUM SURGERY     TONSILLECTOMY     VASECTOMY  FAMILY HISTORY Family History  Problem Relation Age of Onset   Lung cancer Mother 83   Prostate cancer Father 72   Bladder Cancer Father    Lung cancer Maternal Grandfather    Colon cancer Paternal Grandmother    CAD Brother    SOCIAL HISTORY Social History   Tobacco Use   Smoking status: Never   Smokeless tobacco: Current    Types: Snuff  Vaping Use   Vaping status: Never Used  Substance Use Topics   Alcohol use: No    Comment: recovering alcoholic, 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 Mild nonproliferative diabetic retinopathy of both eyes without macular edema associated with type 2 diabetes mellitus (HCC)  J19.1478     2. Current use of insulin (HCC)  Z79.4     3. Long term (current) use of oral hypoglycemic drugs  Z79.84     4. Essential hypertension  I10     5. Hypertensive retinopathy of both eyes  H35.033     6. Combined forms of age-related cataract of both eyes  H25.813        1-3. Mild Non-proliferative diabetic retinopathy OU  - OS w/ mild focal DME/cystic changes temporal fovea/macula -- improved today  - A1c: 9.6 on 10.24.24 - exam shows scattered MA OU; no NV - FA (11.18.22) shows no NV OU, OS with prominent MA temporal to fovea -- potential focal laser target - BCVA OD: stable at 20/25, OS improved to 20/20 from 20/20 - OCT shows: OD: No DME OS: Blunted foveal contour, interval improvement in focal IRF/IRHM temp mac - no treatment recommended at this time - monitor - f/u in 6 months, DFE, OCT  4,5. Hypertensive retinopathy OU - discussed importance of tight BP control - monitor  6. Combined form age-related cataract OU - The symptoms of cataract, surgical options, and treatments and risks were discussed with patient. - discussed diagnosis and progression - monitor  Ophthalmic Meds Ordered this visit:  No orders of the defined types were placed in this encounter.    No follow-ups on file.  There are no Patient Instructions on file for this visit.   Explained the diagnoses, plan, and follow up with the patient and they expressed understanding.  Patient expressed understanding of the importance of proper follow up care.   This document serves as a record of services personally performed by Jeanice Millard, MD, PhD. It was created on their behalf by Olene Berne, COT an ophthalmic technician. The creation of this record is the provider's dictation and/or activities during the visit.    Electronically signed by:  Olene Berne, COT  08/26/23 7:43 AM    Jeanice Millard, M.D., Ph.D. Diseases & Surgery of the Retina and Vitreous [facility]     Abbreviations: M myopia (nearsighted); A astigmatism; H hyperopia (farsighted); P presbyopia; Mrx spectacle prescription;  CTL contact lenses; OD right eye; OS left eye; OU both eyes  XT exotropia; ET esotropia; PEK punctate epithelial keratitis; PEE punctate epithelial erosions; DES dry eye syndrome; MGD meibomian gland dysfunction; ATs artificial tears; PFAT's preservative free artificial tears; NSC nuclear sclerotic cataract; PSC posterior subcapsular cataract; ERM epi-retinal membrane; PVD posterior vitreous detachment; RD retinal  detachment; DM diabetes mellitus; DR diabetic retinopathy; NPDR non-proliferative diabetic retinopathy; PDR proliferative diabetic retinopathy; CSME clinically significant macular edema; DME diabetic macular edema; dbh dot blot hemorrhages; CWS cotton wool spot; POAG primary open angle glaucoma; C/D cup-to-disc ratio; HVF humphrey visual field; GVF goldmann visual field; OCT optical coherence tomography; IOP intraocular pressure; BRVO Branch retinal vein occlusion; CRVO central retinal vein occlusion; CRAO central retinal artery occlusion; BRAO branch retinal artery occlusion; RT retinal tear; SB scleral buckle; PPV pars plana vitrectomy; VH Vitreous hemorrhage; PRP panretinal laser photocoagulation; IVK intravitreal kenalog; VMT vitreomacular traction; MH Macular hole;  NVD neovascularization of the disc; NVE neovascularization elsewhere; AREDS age related eye disease study; ARMD age related macular degeneration; POAG primary open angle glaucoma; EBMD epithelial/anterior basement membrane dystrophy; ACIOL anterior chamber intraocular lens; IOL  intraocular lens; PCIOL posterior chamber intraocular lens; Phaco/IOL phacoemulsification with intraocular lens placement; PRK photorefractive keratectomy; LASIK laser assisted in situ keratomileusis; HTN hypertension; DM diabetes mellitus; COPD chronic obstructive pulmonary disease

## 2023-09-07 ENCOUNTER — Encounter (INDEPENDENT_AMBULATORY_CARE_PROVIDER_SITE_OTHER): Payer: 59 | Admitting: Ophthalmology

## 2023-09-07 DIAGNOSIS — E113293 Type 2 diabetes mellitus with mild nonproliferative diabetic retinopathy without macular edema, bilateral: Secondary | ICD-10-CM

## 2023-09-07 DIAGNOSIS — H35033 Hypertensive retinopathy, bilateral: Secondary | ICD-10-CM

## 2023-09-07 DIAGNOSIS — Z794 Long term (current) use of insulin: Secondary | ICD-10-CM

## 2023-09-07 DIAGNOSIS — H25813 Combined forms of age-related cataract, bilateral: Secondary | ICD-10-CM

## 2023-09-07 DIAGNOSIS — Z7984 Long term (current) use of oral hypoglycemic drugs: Secondary | ICD-10-CM

## 2023-09-07 DIAGNOSIS — I10 Essential (primary) hypertension: Secondary | ICD-10-CM

## 2023-09-20 ENCOUNTER — Other Ambulatory Visit: Payer: Self-pay | Admitting: Medical

## 2023-09-20 DIAGNOSIS — E1159 Type 2 diabetes mellitus with other circulatory complications: Secondary | ICD-10-CM

## 2023-09-21 NOTE — Telephone Encounter (Signed)
 Called and left message for pt to see if he wanted medications sent to [facility] mail order versus going to local [facility]

## 2023-10-01 ENCOUNTER — Telehealth: Payer: Self-pay

## 2023-10-01 MED ORDER — TRULICITY 3 MG/0.5ML ~~LOC~~ SOAJ: 3.0000 mg | SUBCUTANEOUS | 0 refills | Status: DC

## 2023-10-01 MED ORDER — TRULICITY 1.5 MG/0.5ML ~~LOC~~ SOAJ: 1.5000 mg | SUBCUTANEOUS | 0 refills | Status: DC

## 2023-10-01 NOTE — Telephone Encounter (Signed)
 Copied from CRM [id number]. Topic: General - Other >> Sep 30, 2023  8:44 AM Treva T wrote: Reason for CRM: Patient returning call to [place], from 09/21/23 message in chart, regarding medication.  Per patient states, medication, Trulicty he wants sent to [facility], all other medication refill request can go to mail order.  Patient states he can be contacted to verify if needed, at [phone].  [facility] [address] [place] - [address] [place] [address] Phone: [phone] Fax: [phone]

## 2023-12-02 ENCOUNTER — Other Ambulatory Visit: Payer: Self-pay | Admitting: Medical

## 2023-12-27 ENCOUNTER — Other Ambulatory Visit: Payer: Self-pay | Admitting: Medical

## 2023-12-27 NOTE — Telephone Encounter (Signed)
 Dose was increased to 3mg  after finishing 1.5mg 

## 2024-01-05 ENCOUNTER — Ambulatory Visit: Admitting: Medical

## 2024-01-05 VITALS — BP 110/62 | HR 75 | Ht 69.0 in | Wt 180.6 lb

## 2024-01-05 DIAGNOSIS — E113299 Type 2 diabetes mellitus with mild nonproliferative diabetic retinopathy without macular edema, unspecified eye: Secondary | ICD-10-CM

## 2024-01-05 DIAGNOSIS — E1165 Type 2 diabetes mellitus with hyperglycemia: Secondary | ICD-10-CM

## 2024-01-05 DIAGNOSIS — I152 Hypertension secondary to endocrine disorders: Secondary | ICD-10-CM

## 2024-01-05 DIAGNOSIS — E1159 Type 2 diabetes mellitus with other circulatory complications: Secondary | ICD-10-CM | POA: Diagnosis not present

## 2024-01-05 DIAGNOSIS — E785 Hyperlipidemia, unspecified: Secondary | ICD-10-CM

## 2024-01-05 DIAGNOSIS — K219 Gastro-esophageal reflux disease without esophagitis: Secondary | ICD-10-CM

## 2024-01-05 DIAGNOSIS — R350 Frequency of micturition: Secondary | ICD-10-CM

## 2024-01-05 DIAGNOSIS — B351 Tinea unguium: Secondary | ICD-10-CM

## 2024-01-05 DIAGNOSIS — E559 Vitamin D deficiency, unspecified: Secondary | ICD-10-CM

## 2024-01-05 DIAGNOSIS — R972 Elevated prostate specific antigen [PSA]: Secondary | ICD-10-CM

## 2024-01-05 DIAGNOSIS — N401 Enlarged prostate with lower urinary tract symptoms: Secondary | ICD-10-CM | POA: Insufficient documentation

## 2024-01-05 LAB — LIPID PANEL

## 2024-01-05 MED ORDER — ENALAPRIL MALEATE 5 MG PO TABS: 5.0000 mg | ORAL_TABLET | Freq: Every day | ORAL | 3 refills | Status: AC

## 2024-01-05 MED ORDER — EMPAGLIFLOZIN 25 MG PO TABS: ORAL_TABLET | ORAL | 3 refills | Status: AC

## 2024-01-05 MED ORDER — TRULICITY 3 MG/0.5ML ~~LOC~~ SOAJ: 3.0000 mg | SUBCUTANEOUS | 5 refills | Status: AC

## 2024-01-05 NOTE — Progress Notes (Signed)
 Name: Jacob Hart   Date of Visit: 01/05/24   Date of last visit with me: 12/27/2023   CHIEF COMPLAINT:  Chief Complaint  Patient presents with   Medical Management of Chronic Issues    Diabetes, declines shots today for covid and flu. Only on Truculity 1.5mg . looks like 3mg  was sent in back in June but patient didn't know he was suppose to get a higher dose so he has only been doing 1.5mg         HPI:  Discussed the use of AI scribe software for clinical note transcription with the patient, who gave verbal consent to proceed.  History of Present Illness   Jacob Hart is a 58 year old male with diabetes, hyperlipidemia, and hypertension who presents for a medication check.  His blood sugar was 155 mg/dL this morning, typically ranging between 100 and 120 mg/dL upon waking. He is currently on metformin  1000 mg twice daily, Trulicity  1.5 mg weekly, and Jardiance  25 mg daily. He checks his blood sugar four times a week. His last A1c in May was well-controlled.  He is on atorvastatin  10 mg daily for hyperlipidemia and reports no issues with this medication.  For hypertension, he takes enalapril  5 mg daily.  He also takes Prilosec 40 mg daily for gastrointestinal issues and reports needing to stay on it to avoid symptoms.  He is on Lexapro  20 mg daily and Wellbutrin  SR 100 mg daily for mental health management.  He does not smoke.   He has hx/o OSA but does not use a CPAP machine. He plans to see his eye doctor next week. He works at a [employer] with 40 employees and plans to retire in five years. His company provides health insurance at no cost.  He would like to go back on toenail fungus medication.  He used it for a month prior with good response  He notes frequency urination daily, and 1-2 urination at night.    He notes lots of family hx/o cancer.  No other aggravating or relieving factors. No other complaint.   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 90 tablet 3   buPROPion  ER (WELLBUTRIN  SR) 100 MG 12 hr tablet Take 1 tablet (100 mg total) by mouth daily. 90 tablet 3   escitalopram  (LEXAPRO ) 20 MG tablet Take 1 tablet (20 mg total) by mouth daily. 90 tablet 3   Ginkgo Biloba 40 MG TABS Take 1 tablet by mouth in the morning and at bedtime.     metFORMIN  (GLUCOPHAGE ) 1000 MG tablet TAKE 1 TABLET BY MOUTH TWICE  DAILY WITH MEALS 180 tablet 1   omeprazole  (PRILOSEC) 40 MG capsule Take 1 capsule (40 mg total) by mouth daily. 90 capsule 3   No current  facility-administered medications on file prior to visit.   Family History  Problem Relation Age of Onset   Lung cancer Mother 46   Prostate cancer Father 61   Bladder Cancer Father    Lung cancer Maternal Grandfather    Colon cancer Paternal Grandmother    CAD Brother     ROS as in subjective    Objective: BP 110/62   Pulse 75   Ht 5' 9 (1.753 m)   Wt 180 lb 9.6 oz (81.9 kg)   BMI 26.67 kg/m   General appearence: alert, no distress, WD/WN, white male  Heart: RRR, normal S1, S2, no murmurs Lungs: CTA bilaterally, no wheezes, rhonchi, or rales Pulses: 2+ symmetric, upper and 1+ lower extremities, normal cap refill DRE: anus normal tone, prostate moderately enlarged, no  nodules   Diabetic Foot Exam - Simple   Simple Foot Form Diabetic Foot exam was performed with the following findings: Yes 01/05/2024 10:46 AM  Visual Inspection See comments: Yes Sensation Testing Intact to touch and monofilament testing bilaterally: Yes Pulse Check See comments: Yes Comments Yellow brown coloration of bilat great toenails with some thickened of same toenails, rest of toenails mostly unremarkable, 1+ pedal pulses        Assessment and Plan Encounter Diagnoses  Name Primary?   Type 2 diabetes mellitus with mild nonproliferative retinopathy without macular edema, unspecified laterality, unspecified whether long term insulin use (HCC) Yes   Hypertension associated with diabetes (HCC)    Uncontrolled type 2 diabetes mellitus with hyperglycemia (HCC)    Hyperlipidemia, unspecified hyperlipidemia type    Vitamin D  deficiency    GERD without esophagitis    Elevated PSA    Benign prostatic hyperplasia with urinary frequency    Onychomycosis     Type 2 diabetes mellitus Blood sugar levels generally between 100-120 mg/dL, occasional spikes to 844 mg/dL. Previous A1c well-controlled. Current regimen includes metformin , Trulicity , and Jardiance . - Order A1c test. - Increase Trulicity  to 3 mg weekly. - assuming stable Hgba1c, plan to reduce metformin  down from 1000mg  to 500mg  BID - continue Jardiance  25mg  daily - Discuss continuous glucose monitoring options.  Hypertension Well-controlled on enalapril . Blood pressure stable. - Refill enalapril  5mg  daily  Hyperlipidemia Managed with atorvastatin  10 mg daily. No issues reported. - Ensure atorvastatin  has refills.  Gastroesophageal reflux disease (GERD) Managed with Prilosec 40 mg daily. Needs to continue medication for symptom control.  Depression Managed with Lexapro  20 mg daily and Wellbutrin  100 mg daily. Regimen stable. - Ensure Lexapro  has refills.  Elevated prostate-specific antigen (PSA) Previous PSA  3.2, increased but under 4. Discussed PSA as a screening tool for prostate cancer and other prostate conditions. - consider flomax for urinary symptoms  Onychomycosis  -pending labs if stable, restart trial of Lamisil  250mg  oral daily for 1-2 months    Jacob Hart was seen today for medical management of chronic issues.  Diagnoses and all orders for this visit:  Type 2 diabetes mellitus with mild nonproliferative retinopathy without macular edema, unspecified laterality, unspecified whether long term insulin use (HCC) -     CBC -     Microalbumin/Creatinine Ratio, Urine -     Urinalysis, Routine w reflex microscopic  Hypertension associated with diabetes (HCC) -     Comprehensive metabolic panel with eGFR (no eGFR if sent to [facility]) [LAB17] -     Hemoglobin A1c -     enalapril  (VASOTEC ) 5 MG tablet; Take 1 tablet (5 mg total) by mouth daily. -  CBC  Uncontrolled type 2 diabetes mellitus with hyperglycemia (HCC) -     Comprehensive metabolic panel with eGFR (no eGFR if sent to [facility]) [LAB17] -     Hemoglobin A1c -     empagliflozin  (JARDIANCE ) 25 MG TABS tablet; TAKE 1 TABLET (25 MG TOTAL) BY MOUTH DAILY BEFORE BREAKFAST.  Hyperlipidemia, unspecified hyperlipidemia type -     Lipid panel  Vitamin D  deficiency  GERD without esophagitis  Elevated PSA -     PSA, total and free  Benign prostatic hyperplasia with urinary frequency  Onychomycosis  Other orders -     Dulaglutide  (TRULICITY ) 3 MG/0.5ML SOAJ; Inject 3 mg as directed once a week.    F/u pending labs

## 2024-01-06 ENCOUNTER — Other Ambulatory Visit: Payer: Self-pay | Admitting: Medical

## 2024-01-06 ENCOUNTER — Ambulatory Visit: Payer: Self-pay | Admitting: Medical

## 2024-01-06 DIAGNOSIS — R972 Elevated prostate specific antigen [PSA]: Secondary | ICD-10-CM

## 2024-01-06 LAB — COMPREHENSIVE METABOLIC PANEL WITH GFR
ALT: 19 IU/L (ref 0–44)
AST: 15 IU/L (ref 0–40)
Albumin: 4.8 g/dL (ref 3.8–4.9)
Alkaline Phosphatase: 84 IU/L (ref 47–123)
BUN/Creatinine Ratio: 16 (ref 9–20)
BUN: 12 mg/dL (ref 6–24)
Bilirubin Total: 0.4 mg/dL (ref 0.0–1.2)
CO2: 21 mmol/L (ref 20–29)
Calcium: 9.9 mg/dL (ref 8.7–10.2)
Chloride: 100 mmol/L (ref 96–106)
Creatinine, Ser: 0.75 mg/dL — ABNORMAL LOW (ref 0.76–1.27)
Globulin, Total: 1.8 g/dL (ref 1.5–4.5)
Glucose: 106 mg/dL — ABNORMAL HIGH (ref 70–99)
Potassium: 4.2 mmol/L (ref 3.5–5.2)
Sodium: 139 mmol/L (ref 134–144)
Total Protein: 6.6 g/dL (ref 6.0–8.5)
eGFR: 105 mL/min/1.73 (ref >59)

## 2024-01-06 LAB — CBC
Hematocrit: 49.2 % (ref 37.5–51.0)
Hemoglobin: 17 g/dL (ref 13.0–17.7)
MCH: 32.4 pg (ref 26.6–33.0)
MCHC: 34.6 g/dL (ref 31.5–35.7)
MCV: 94 fL (ref 79–97)
Platelets: 253 x10E3/uL (ref 150–450)
RBC: 5.25 x10E6/uL (ref 4.14–5.80)
RDW: 13.1 % (ref 11.6–15.4)
WBC: 7.1 x10E3/uL (ref 3.4–10.8)

## 2024-01-06 LAB — LIPID PANEL
Cholesterol, Total: 107 mg/dL (ref 100–199)
HDL: 33 mg/dL — AB (ref >39)
LDL CALC COMMENT:: 3.2 ratio (ref 0.0–5.0)
LDL Chol Calc (NIH): 54 mg/dL (ref 0–99)
Triglycerides: 109 mg/dL (ref 0–149)
VLDL Cholesterol Cal: 20 mg/dL (ref 5–40)

## 2024-01-06 LAB — URINALYSIS, ROUTINE W REFLEX MICROSCOPIC
Bilirubin, UA: NEGATIVE
Ketones, UA: NEGATIVE
Leukocytes,UA: NEGATIVE
Nitrite, UA: NEGATIVE
Protein,UA: NEGATIVE
RBC, UA: NEGATIVE
Specific Gravity, UA: 1.018 (ref 1.005–1.030)
Urobilinogen, Ur: 0.2 mg/dL (ref 0.2–1.0)
pH, UA: 7.5 (ref 5.0–7.5)

## 2024-01-06 LAB — PSA, TOTAL AND FREE
PSA, Free Pct: 10.7 %
PSA, Free: 1.8 ng/mL
Prostate Specific Ag, Serum: 16.9 ng/mL — ABNORMAL HIGH (ref 0.0–4.0)

## 2024-01-06 LAB — MICROALBUMIN / CREATININE URINE RATIO
Creatinine, Urine: 24.6 mg/dL
Microalb/Creat Ratio: 68 mg/g{creat} — AB (ref 0–29)
Microalbumin, Urine: 16.8 ug/mL

## 2024-01-06 LAB — HEMOGLOBIN A1C
Est. average glucose Bld gHb Est-mCnc: 134 mg/dL
Hgb A1c MFr Bld: 6.3 % — ABNORMAL HIGH (ref 4.8–5.6)

## 2024-01-06 MED ORDER — METFORMIN HCL 500 MG PO TABS: 500.0000 mg | ORAL_TABLET | Freq: Two times a day (BID) | ORAL | 1 refills | Status: AC

## 2024-01-06 NOTE — Progress Notes (Signed)
 Results through MyChart

## 2024-01-10 ENCOUNTER — Other Ambulatory Visit: Payer: Self-pay | Admitting: Medical

## 2024-01-10 DIAGNOSIS — F32A Depression, unspecified: Secondary | ICD-10-CM

## 2024-01-11 ENCOUNTER — Other Ambulatory Visit (HOSPITAL_COMMUNITY): Payer: Self-pay | Admitting: Physician Assistant

## 2024-01-11 DIAGNOSIS — R972 Elevated prostate specific antigen [PSA]: Secondary | ICD-10-CM

## 2024-01-14 ENCOUNTER — Ambulatory Visit (HOSPITAL_COMMUNITY)
Admission: RE | Admit: 2024-01-14 | Discharge: 2024-01-14 | Disposition: A | Discharge status: Home / Self Care | Source: Ambulatory Visit | Attending: Physician Assistant | Admitting: Physician Assistant

## 2024-01-14 ENCOUNTER — Other Ambulatory Visit (HOSPITAL_COMMUNITY): Payer: Self-pay | Admitting: Physician Assistant

## 2024-01-14 DIAGNOSIS — R972 Elevated prostate specific antigen [PSA]: Secondary | ICD-10-CM

## 2024-01-14 DIAGNOSIS — Z0189 Encounter for other specified special examinations: Secondary | ICD-10-CM

## 2024-01-14 MED ORDER — GADOBUTROL 1 MMOL/ML IV SOLN
7.5000 mL | Freq: Once | INTRAVENOUS | Status: AC | PRN
  Administered 2024-01-14: 7.5 mL via INTRAVENOUS

## 2024-01-17 ENCOUNTER — Other Ambulatory Visit: Payer: Self-pay | Admitting: Medical

## 2024-01-17 DIAGNOSIS — E1165 Type 2 diabetes mellitus with hyperglycemia: Secondary | ICD-10-CM

## 2024-01-29 IMAGING — DX DG FOOT COMPLETE 3+V*L*
3 series · 3 of 3 positions shown · non-contrast
Comparison: None Available.

CLINICAL DATA: Insert his

EXAM:
LEFT FOOT - COMPLETE 3+ VIEW

[foot ap]
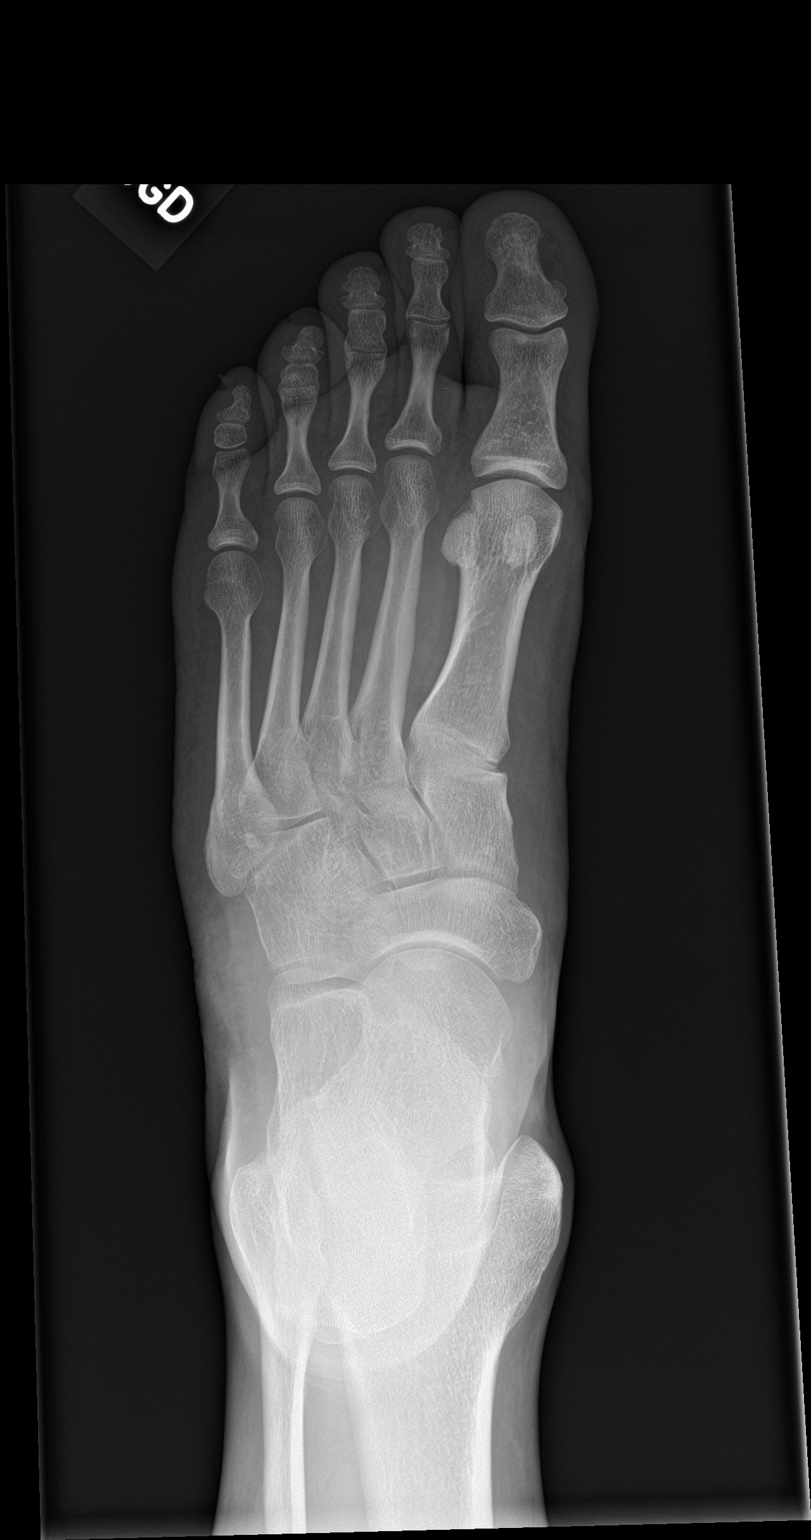

[foot obl]
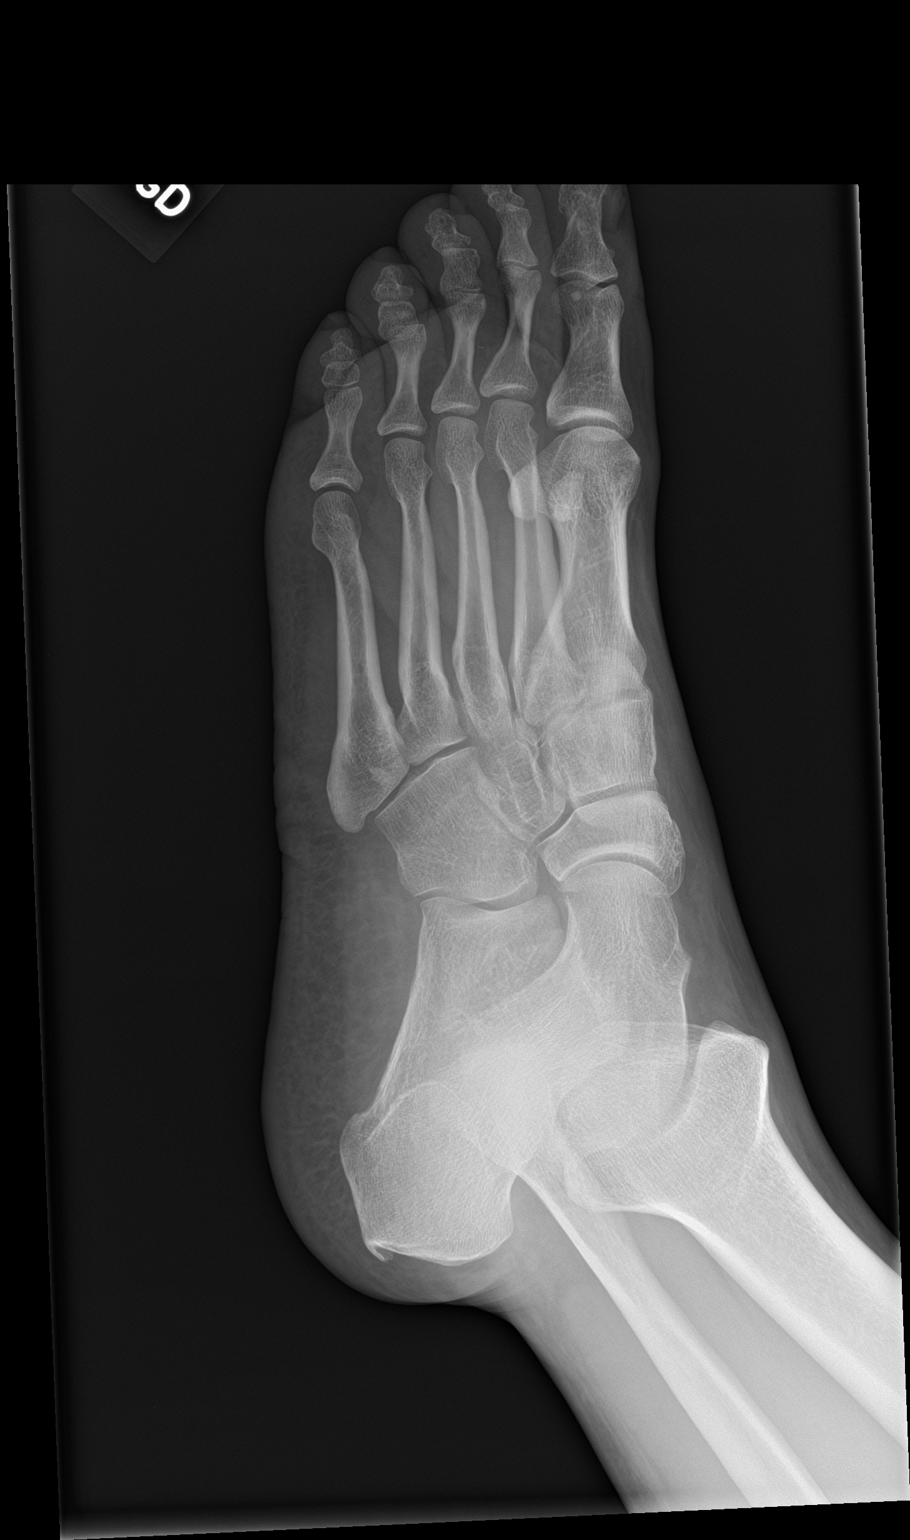

[foot lat]
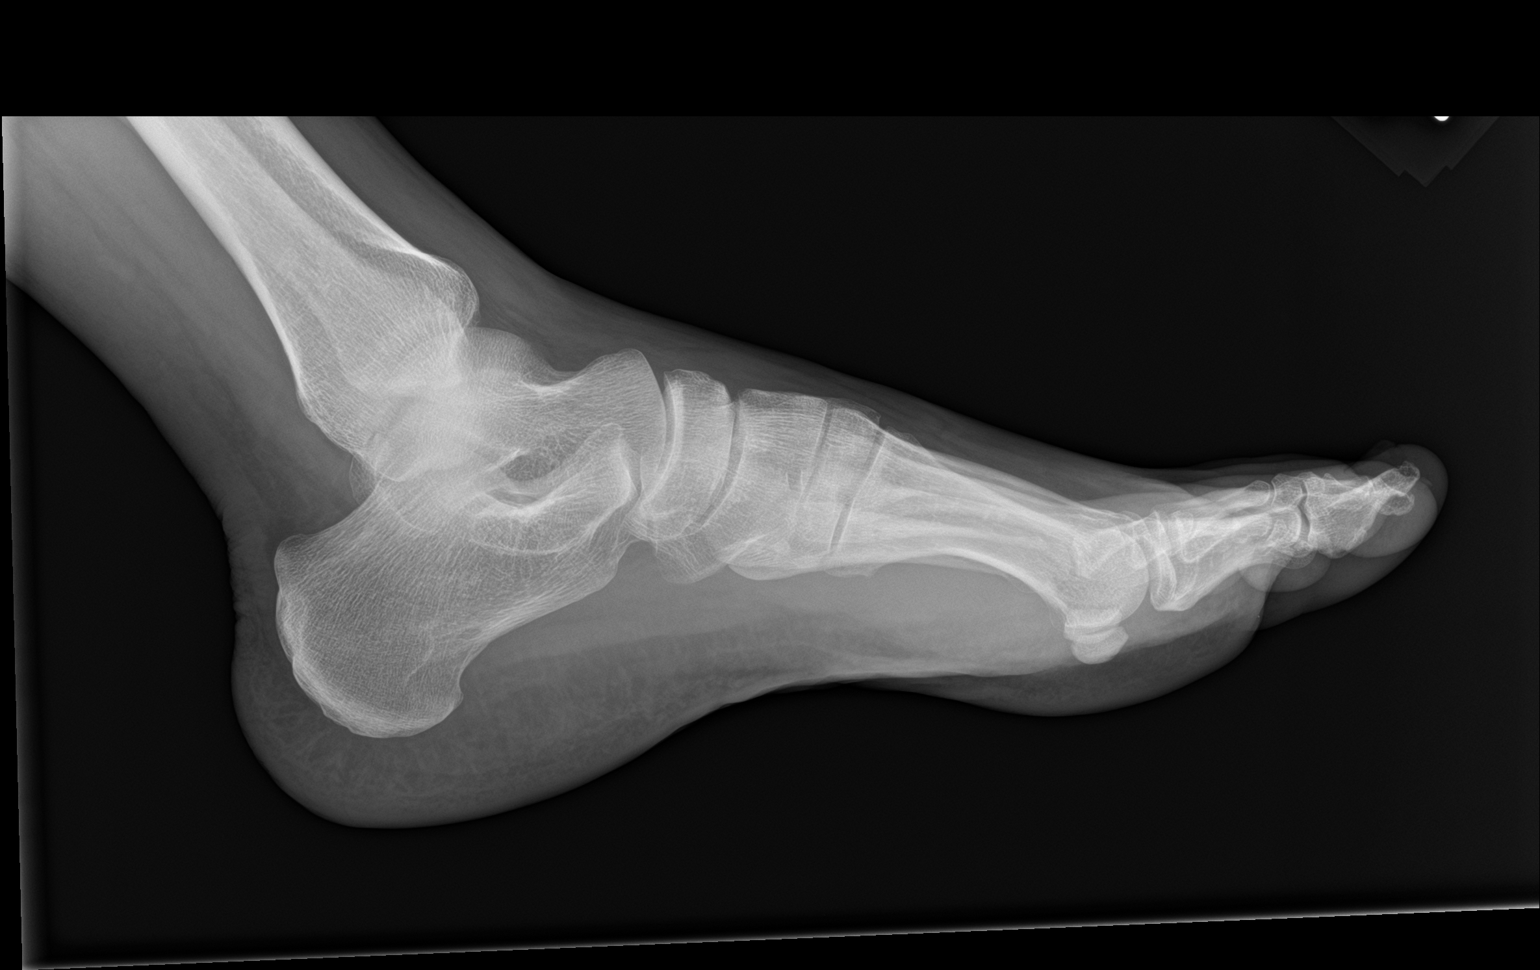

[3 of 3 positions shown; findings below may reference images not displayed]

FINDINGS: There is a linear, 2 mm radiopaque foreign body overlying the
plantar medial soft tissues of the base of the second toe. There is
no acute osseous abnormality. Alignment is normal.
IMPRESSION: Linear, 2 mm radiopaque foreign body overlying the plantar medial
soft tissues of the base of the second toe.

No acute osseous abnormality

## 2024-02-10 ENCOUNTER — Encounter: Admitting: Medical

## 2024-07-19 ENCOUNTER — Encounter: Payer: Self-pay | Admitting: Medical
